# Patient Record
Sex: Female | Born: 1940 | Hispanic: No | Marital: Married | Smoking: Never smoker
Health system: Southern US, Community
[De-identification: ages and names within clinical notes are randomized; demographics above are authoritative.]

## PROBLEM LIST (undated history)

## (undated) DIAGNOSIS — H16009 Unspecified corneal ulcer, unspecified eye: Secondary | ICD-10-CM

## (undated) DIAGNOSIS — R9431 Abnormal electrocardiogram [ECG] [EKG]: Secondary | ICD-10-CM

## (undated) DIAGNOSIS — E669 Obesity, unspecified: Secondary | ICD-10-CM

## (undated) DIAGNOSIS — E7800 Pure hypercholesterolemia, unspecified: Secondary | ICD-10-CM

## (undated) DIAGNOSIS — R5383 Other fatigue: Secondary | ICD-10-CM

## (undated) DIAGNOSIS — K219 Gastro-esophageal reflux disease without esophagitis: Secondary | ICD-10-CM

## (undated) DIAGNOSIS — M8580 Other specified disorders of bone density and structure, unspecified site: Secondary | ICD-10-CM

## (undated) DIAGNOSIS — Z8719 Personal history of other diseases of the digestive system: Secondary | ICD-10-CM

## (undated) DIAGNOSIS — I10 Essential (primary) hypertension: Secondary | ICD-10-CM

## (undated) DIAGNOSIS — K5792 Diverticulitis of intestine, part unspecified, without perforation or abscess without bleeding: Secondary | ICD-10-CM

## (undated) DIAGNOSIS — E049 Nontoxic goiter, unspecified: Secondary | ICD-10-CM

## (undated) HISTORY — PX: BREAST LUMPECTOMY: SHX2

## (undated) HISTORY — DX: Other specified disorders of bone density and structure, unspecified site: M85.80

## (undated) HISTORY — PX: ABDOMINAL HYSTERECTOMY: SHX81

## (undated) HISTORY — PX: TONSILLECTOMY: SHX5217

## (undated) HISTORY — DX: Other fatigue: R53.83

## (undated) HISTORY — DX: Obesity, unspecified: E66.9

---

## 2001-09-03 ENCOUNTER — Ambulatory Visit (HOSPITAL_COMMUNITY): Admission: RE | Admit: 2001-09-03 | Discharge: 2001-09-03 | Payer: Self-pay | Admitting: Gastroenterology

## 2002-07-28 ENCOUNTER — Encounter: Payer: Self-pay | Admitting: Family Medicine

## 2002-07-28 ENCOUNTER — Encounter: Admission: RE | Admit: 2002-07-28 | Discharge: 2002-07-28 | Payer: Self-pay | Admitting: Family Medicine

## 2004-07-18 ENCOUNTER — Other Ambulatory Visit: Admission: RE | Admit: 2004-07-18 | Discharge: 2004-07-18 | Payer: Self-pay | Admitting: Obstetrics and Gynecology

## 2006-03-23 HISTORY — PX: US ECHOCARDIOGRAPHY: HXRAD669

## 2009-06-08 HISTORY — PX: CARDIOVASCULAR STRESS TEST: SHX262

## 2010-10-23 DIAGNOSIS — H16009 Unspecified corneal ulcer, unspecified eye: Secondary | ICD-10-CM

## 2010-10-23 HISTORY — DX: Unspecified corneal ulcer, unspecified eye: H16.009

## 2011-09-23 ENCOUNTER — Emergency Department (HOSPITAL_COMMUNITY)
Admission: EM | Admit: 2011-09-23 | Discharge: 2011-09-23 | Disposition: A | Discharge status: Home / Self Care | Payer: Medicare Other | Source: Home / Self Care | Attending: Emergency Medicine | Admitting: Emergency Medicine

## 2011-09-23 ENCOUNTER — Encounter: Payer: Self-pay | Admitting: *Deleted

## 2011-09-23 DIAGNOSIS — K5792 Diverticulitis of intestine, part unspecified, without perforation or abscess without bleeding: Secondary | ICD-10-CM

## 2011-09-23 DIAGNOSIS — K5732 Diverticulitis of large intestine without perforation or abscess without bleeding: Secondary | ICD-10-CM

## 2011-09-23 HISTORY — DX: Diverticulitis of intestine, part unspecified, without perforation or abscess without bleeding: K57.92

## 2011-09-23 HISTORY — DX: Pure hypercholesterolemia, unspecified: E78.00

## 2011-09-23 HISTORY — DX: Essential (primary) hypertension: I10

## 2011-09-23 NOTE — ED Provider Notes (Signed)
History     CSN: [id number] Arrival date & time: 09/23/2011  8:01 PM   First MD Initiated Contact with Patient 09/23/11 1821      Chief Complaint  Patient presents with  . Abdominal Pain    left lower abdominal pain completed antibiotic cipro  wednesday for diverticulitis -  had prescription for clindamycin 150mg  label reads 3 capsules  TID for 10 days pt was taking only one capsule TID stopped taking wednesday thought was given too many pills     (Consider location/radiation/quality/duration/timing/severity/associated sxs/prior treatment) HPI Comments: Whitney Morales presents for evaluation of persistent LLQ abdominal pain. She states that she was dx'd with diverticulitis on 11/19; given cipro and clinda 150 mg 3 capsules TID, thus equaling 9 pills daily. She misunderstood and only took one clinda capsule three times daily and completed cipro; now with residual LLQ pain, described as 4/10 and less than previous episodes of diverticulitis; advised to complete course and follow up with [facility].  Patient is a 70 y.o. female presenting with abdominal pain. The history is provided by the patient and the spouse.  Abdominal Pain The primary symptoms of the illness include abdominal pain. The primary symptoms of the illness do not include fever, nausea, vomiting or diarrhea. The current episode started more than 2 days ago. The problem has been gradually improving.  The patient states that she believes she is currently not pregnant. Symptoms associated with the illness do not include chills or constipation. Significant associated medical issues include diverticulitis.    Past Medical History  Diagnosis Date  . Diverticulitis   . Diabetes mellitus   . Hypertension   . High cholesterol     Past Surgical History  Procedure Date  . Abdominal hysterectomy   . Breast lumpectomy   . Tonsillectomy     History reviewed. No pertinent family history.  History  Substance Use Topics  . Smoking  status: Never Smoker   . Smokeless tobacco: Not on file  . Alcohol Use: No    OB History    Grav Para Term Preterm Abortions TAB SAB Ect Mult Living                  Review of Systems  Constitutional: Negative for fever, chills and appetite change.  HENT: Negative.   Eyes: Negative.   Respiratory: Negative.   Cardiovascular: Negative.   Gastrointestinal: Positive for abdominal pain. Negative for nausea, vomiting, diarrhea and constipation.  Genitourinary: Negative.   Musculoskeletal: Negative.   Skin: Negative.   Neurological: Negative.     Allergies  Metronidazole hcl  Home Medications   Current Outpatient Rx  Name Route Sig Dispense Refill  . ASPIRIN 81 MG PO TABS Oral Take 81 mg by mouth daily.      Marland Kitchen FLUTICASONE PROPIONATE 50 MCG/ACT NA SUSP Nasal Place 2 sprays into the nose 1 day or 1 dose.      Marland Kitchen GLIPIZIDE 5 MG PO TABS Oral Take 5 mg by mouth 2 (two) times daily before a meal.      . LISINOPRIL-HYDROCHLOROTHIAZIDE 20-25 MG PO TABS Oral Take 1 tablet by mouth daily.      Marland Kitchen METFORMIN HCL 500 MG PO TABS Oral Take 500 mg by mouth 2 (two) times daily with a meal.      . SIMVASTATIN 40 MG PO TABS Oral Take 40 mg by mouth at bedtime.      Marland Kitchen ESZOPICLONE 1 MG PO TABS Oral Take 1 mg by mouth at  bedtime. Take immediately before bedtime       BP 179/75  Pulse 86  Temp(Src) 98.9 F (37.2 C) (Oral)  Resp 18  SpO2 100%  Physical Exam  Nursing note and vitals reviewed. Constitutional: She is oriented to person, place, and time. She appears well-developed and well-nourished.  HENT:  Head: Normocephalic and atraumatic.  Eyes: EOM are normal.  Neck: Normal range of motion.  Pulmonary/Chest: Effort normal.  Abdominal: Soft. Normal appearance and bowel sounds are normal. There is tenderness in the left lower quadrant. There is guarding. There is no rebound.  Musculoskeletal: Normal range of motion.  Neurological: She is alert and oriented to person, place, and time.  Skin:  Skin is warm and dry.  Psychiatric: Her behavior is normal.    ED Course  Procedures (including critical care time)  Labs Reviewed - No data to display No results found.   No diagnosis found.    MDM          Richardo Priest, MD 09/26/11 [phone]

## 2012-01-17 ENCOUNTER — Encounter: Payer: Self-pay | Admitting: *Deleted

## 2012-09-10 ENCOUNTER — Other Ambulatory Visit: Payer: Self-pay | Admitting: Family Medicine

## 2012-09-10 DIAGNOSIS — E041 Nontoxic single thyroid nodule: Secondary | ICD-10-CM

## 2012-09-11 ENCOUNTER — Ambulatory Visit
Admission: RE | Admit: 2012-09-11 | Discharge: 2012-09-11 | Disposition: A | Discharge status: Home / Self Care | Payer: Medicare Other | Source: Ambulatory Visit | Attending: Family Medicine | Admitting: Family Medicine

## 2012-09-11 DIAGNOSIS — E041 Nontoxic single thyroid nodule: Secondary | ICD-10-CM

## 2012-09-13 ENCOUNTER — Encounter (INDEPENDENT_AMBULATORY_CARE_PROVIDER_SITE_OTHER): Payer: Self-pay | Admitting: General Surgery

## 2012-09-16 ENCOUNTER — Encounter (INDEPENDENT_AMBULATORY_CARE_PROVIDER_SITE_OTHER): Payer: Self-pay

## 2012-09-17 ENCOUNTER — Other Ambulatory Visit (INDEPENDENT_AMBULATORY_CARE_PROVIDER_SITE_OTHER): Payer: Self-pay | Admitting: General Surgery

## 2012-09-17 ENCOUNTER — Encounter (INDEPENDENT_AMBULATORY_CARE_PROVIDER_SITE_OTHER): Payer: Self-pay | Admitting: General Surgery

## 2012-09-17 ENCOUNTER — Ambulatory Visit (INDEPENDENT_AMBULATORY_CARE_PROVIDER_SITE_OTHER): Payer: Medicare Other | Admitting: General Surgery

## 2012-09-17 VITALS — BP 170/90 | HR 80 | Temp 98.4°F | Resp 18 | Ht 63.0 in | Wt 230.0 lb

## 2012-09-17 DIAGNOSIS — E039 Hypothyroidism, unspecified: Secondary | ICD-10-CM

## 2012-09-17 DIAGNOSIS — E079 Disorder of thyroid, unspecified: Secondary | ICD-10-CM

## 2012-09-17 DIAGNOSIS — E049 Nontoxic goiter, unspecified: Secondary | ICD-10-CM

## 2012-09-17 NOTE — Progress Notes (Signed)
Patient ID: Whitney Morales, female   DOB: December 17, 1940, 71 y.o.   MRN: [id number]  Chief Complaint  Patient presents with  . New Evaluation    eval thyroid    HPI Whitney Morales is a 72 y.o. female.  The patient is a 71 year old female who is referred by Dr. Tanya Nones for a right thyroid mass.  The patient is she's had no symptoms of hypothyroidism and has noticed a mass in the right area of her neck. Patient was worked up and was found to have an elevated TSH of 5.104. Patient also ultrasound which revealed 2.8 cm right thyroid complex cystic lesion. There was no biopsy done on this at this point. Patient was patient otherwise notes no subjective changes in temperature no changes, or bone pain. HPI  Past Medical History  Diagnosis Date  . Diverticulitis   . Diabetes mellitus   . Hypertension   . High cholesterol   . Fatigue   . Obesity     Past Surgical History  Procedure Date  . Abdominal hysterectomy   . Breast lumpectomy   . Tonsillectomy   . US echocardiography 03/23/2006    EF 55-60%  . Cardiovascular stress test 06/08/2009    EF 78%, NO ISCHEMIA    No family history on file.  Social History History  Substance Use Topics  . Smoking status: Never Smoker   . Smokeless tobacco: Not on file  . Alcohol Use: No    Allergies  Allergen Reactions  . Metronidazole Hcl     Current Outpatient Prescriptions  Medication Sig Dispense Refill  . aspirin 81 MG tablet Take 81 mg by mouth daily.        . eszopiclone (LUNESTA) 1 MG TABS Take 1 mg by mouth at bedtime. Take immediately before bedtime       . fluticasone (FLONASE) 50 MCG/ACT nasal spray Place 2 sprays into the nose 1 day or 1 dose.        Marland Kitchen glipiZIDE (GLUCOTROL) 5 MG tablet Take 5 mg by mouth 2 (two) times daily before a meal.        . lisinopril-hydrochlorothiazide (PRINZIDE,ZESTORETIC) 20-25 MG per tablet Take 1 tablet by mouth daily.        . metFORMIN (GLUCOPHAGE) 500 MG tablet Take 500 mg by mouth 2 (two) times  daily with a meal.        . simvastatin (ZOCOR) 40 MG tablet Take 40 mg by mouth at bedtime.          Review of Systems Review of Systems  Constitutional: Negative.   HENT: Negative.   Eyes: Negative.   Respiratory: Negative.   Cardiovascular: Negative.   Gastrointestinal: Negative.   Musculoskeletal: Negative.   Neurological: Negative.     Blood pressure 170/90, pulse 80, temperature 98.4 F (36.9 C), resp. rate 18, height 5\' 3"  (1.6 m), weight 230 lb (104.327 kg).  Physical Exam Physical Exam  Constitutional: She is oriented to person, place, and time. She appears well-developed and well-nourished.  HENT:  Head: Normocephalic and atraumatic.  Eyes: Conjunctivae normal and EOM are normal. Pupils are equal, round, and reactive to light.  Neck: Normal range of motion. Neck supple.    Cardiovascular: Normal rate, regular rhythm and normal heart sounds.   Pulmonary/Chest: Effort normal and breath sounds normal.  Abdominal: Soft. Bowel sounds are normal.  Musculoskeletal: Normal range of motion.  Neurological: She is alert and oriented to person, place, and time.    Data Reviewed Ultrasound  revealing a 3.8 cm right thyroid complex cystic lesion ; TSH of 5.104 (H)  Assessment    The patient is a 71 year old female with a right Thyroid mass.  Her ultrasound and lab for studies have been reviewed. Secondary to patient's large right thyroid mass which is likely secondary to goiter, I would still recommend a FNA biopsy.  A long discussion with the patient and her husband and described a stepwise fashion with biopsy, biopsy report, and then set up for surgery of the likely right hemithyroidectomy should the pathology results revealed benign or questionable results. Let's proceed for a total thyroidectomy should the biopsy revealed cancer. She has no cancer within her family, or thyroid cancer.    Plan    1. Will proceed for ultrasound-guided FNA. Pending those results we will set  the patient up for her right upper lobectomy versus total thyroidectomy. The patient and her husband were given a handout regarding the thyroid and thyroid goiters.       Marigene Ehlers., Ceonna Frazzini 09/17/2012, 2:42 PM

## 2012-09-25 ENCOUNTER — Other Ambulatory Visit (HOSPITAL_COMMUNITY)
Admission: RE | Admit: 2012-09-25 | Discharge: 2012-09-25 | Disposition: A | Discharge status: Home / Self Care | Payer: Medicare Other | Source: Ambulatory Visit | Attending: Interventional Radiology | Admitting: Interventional Radiology

## 2012-09-25 ENCOUNTER — Ambulatory Visit
Admission: RE | Admit: 2012-09-25 | Discharge: 2012-09-25 | Disposition: A | Discharge status: Home / Self Care | Payer: Medicare Other | Source: Ambulatory Visit | Attending: General Surgery | Admitting: General Surgery

## 2012-09-25 DIAGNOSIS — E049 Nontoxic goiter, unspecified: Secondary | ICD-10-CM | POA: Insufficient documentation

## 2012-09-25 DIAGNOSIS — E079 Disorder of thyroid, unspecified: Secondary | ICD-10-CM

## 2012-10-01 ENCOUNTER — Telehealth (INDEPENDENT_AMBULATORY_CARE_PROVIDER_SITE_OTHER): Payer: Self-pay

## 2012-10-01 ENCOUNTER — Other Ambulatory Visit (INDEPENDENT_AMBULATORY_CARE_PROVIDER_SITE_OTHER): Payer: Self-pay | Admitting: General Surgery

## 2012-10-01 NOTE — Telephone Encounter (Signed)
Patient calling in for FNA results and treatment plan.  Patient said that it's okay to leave a detailed message on [phone].

## 2012-10-21 ENCOUNTER — Encounter (HOSPITAL_COMMUNITY): Payer: Self-pay | Admitting: Pharmacy Technician

## 2012-10-29 ENCOUNTER — Encounter (HOSPITAL_COMMUNITY)
Admission: RE | Admit: 2012-10-29 | Discharge: 2012-10-29 | Disposition: A | Discharge status: Home / Self Care | Payer: Medicare Other | Source: Ambulatory Visit | Attending: Anesthesiology | Admitting: Anesthesiology

## 2012-10-29 ENCOUNTER — Encounter (HOSPITAL_COMMUNITY)
Admission: RE | Admit: 2012-10-29 | Discharge: 2012-10-29 | Disposition: A | Discharge status: Home / Self Care | Payer: Medicare Other | Source: Ambulatory Visit | Attending: General Surgery | Admitting: General Surgery

## 2012-10-29 ENCOUNTER — Encounter (HOSPITAL_COMMUNITY): Payer: Self-pay

## 2012-10-29 DIAGNOSIS — R9431 Abnormal electrocardiogram [ECG] [EKG]: Secondary | ICD-10-CM

## 2012-10-29 HISTORY — DX: Personal history of other diseases of the digestive system: Z87.19

## 2012-10-29 HISTORY — DX: Nontoxic goiter, unspecified: E04.9

## 2012-10-29 HISTORY — DX: Abnormal electrocardiogram (ECG) (EKG): R94.31

## 2012-10-29 LAB — CBC
HCT: 40.9 % (ref 36.0–46.0)
Hemoglobin: 14 g/dL (ref 12.0–15.0)
MCH: 30.4 pg (ref 26.0–34.0)
MCHC: 34.2 g/dL (ref 30.0–36.0)
MCV: 88.9 fL (ref 78.0–100.0)
Platelets: 370 10*3/uL (ref 150–400)
RBC: 4.6 MIL/uL (ref 3.87–5.11)
RDW: 12.9 % (ref 11.5–15.5)
WBC: 12.1 10*3/uL — ABNORMAL HIGH (ref 4.0–10.5)

## 2012-10-29 LAB — BASIC METABOLIC PANEL
BUN: 20 mg/dL (ref 6–23)
CO2: 25 mEq/L (ref 19–32)
Calcium: 9.9 mg/dL (ref 8.4–10.5)
Chloride: 94 mEq/L — ABNORMAL LOW (ref 96–112)
Creatinine, Ser: 1.29 mg/dL — ABNORMAL HIGH (ref 0.50–1.10)
GFR calc Af Amer: 48 mL/min — ABNORMAL LOW (ref >90)
GFR calc non Af Amer: 41 mL/min — ABNORMAL LOW (ref >90)
Glucose, Bld: 173 mg/dL — ABNORMAL HIGH (ref 70–99)
Potassium: 4.5 mEq/L (ref 3.5–5.1)
Sodium: 135 mEq/L (ref 135–145)

## 2012-10-29 LAB — SURGICAL PCR SCREEN
MRSA, PCR: NEGATIVE
Staphylococcus aureus: POSITIVE — AB

## 2012-10-29 NOTE — Pre-Procedure Instructions (Signed)
20 Whitney Morales  10/29/2012   Your procedure is scheduled on:  January 13  Report to [facility] at 09:50 AM.  Call this number if you have problems the morning of surgery: [phone]   Remember:   Do not eat or drink:After Midnight.  Take these medicines the morning of surgery with A SIP OF WATER: Flonase   STOP Aspirin after today  Do not wear jewelry, make-up or nail polish.  Do not wear lotions, powders, or perfumes. You may wear deodorant.  Do not shave 48 hours prior to surgery. Men may shave face and neck.  Do not bring valuables to the hospital.  Contacts, dentures or bridgework may not be worn into surgery.  Leave suitcase in the car. After surgery it may be brought to your room.  For patients admitted to the hospital, checkout time is 11:00 AM the day of discharge.   Patients discharged the day of surgery will not be allowed to drive home.  Name and phone number of your driver: Family/ Friend  Special Instructions: Shower using CHG 2 nights before surgery and the night before surgery.  If you shower the day of surgery use CHG.  Use special wash - you have one bottle of CHG for all showers.  You should use approximately 1/3 of the bottle for each shower.   Please read over the following fact sheets that you were given: Pain Booklet, Coughing and Deep Breathing and Surgical Site Infection Prevention

## 2012-10-29 NOTE — Progress Notes (Signed)
Requested records from Dr. Delbert Harness stress test and most recent OV from ~ 2 years ago. They reported that this this had been transferred to pt's new PCP contacted Dr. Felisa Bonier office and they report that they do not have test on file. Will re contact Dr. Delbert Harness office and once reopened and determine if they still have copies of reports.

## 2012-10-30 ENCOUNTER — Encounter (HOSPITAL_COMMUNITY): Payer: Self-pay | Admitting: Vascular Surgery

## 2012-10-30 NOTE — Consult Note (Addendum)
Anesthesia Chart Review:  Patient is a 72 year old female scheduled for right thyroid lobectomy by Dr. Derrell Lolling on 11/04/12.  History includes obesity, non-smoker, HTN, DM type 2, hypercholesterolemia, diverticulitis, goiter, hiatal hernia, parents both with history of CAD.  PCP is Dr. Lynnea Ferrier.     EKG on 10/29/12 showed NSR, LAD, inferior infarct and anterior infarct (age undetermined).  Currently, there are no comparison EKG available.  Her PAT RN had written that she had a previous stress test at Dr. Otilio Saber office, but their office reports she was never a patient there.  Patient confirms this.  She did say that she saw a Cardiologist at [facility])  "years" ago for a baseline stress test and what sounds like abnormal EKG (she said they were concerned that she may have had a heart attack, but tests proved to be negative for MI).  Unfortunately, Dr. Caren Macadam office does not have record of a prior stress test or an old EKG.  [facility] also has no records.  Patient does report "indigestion" after a large meal relieved with soda, but otherwise denies chest pain.  She said that she vacationed in [place] in September 2013 and was able to do several walking towards including walking up the stairs in [place] (reportedly equivalent to "13 stories").   She denies SOB at rest and significant DOE.  She gets mild LE edema that is controlled by Lasix.     CXR on 10/29/12 showed: Lung volumes are low with crowding of the bronchovascular markings. Lung volumes are low. No focal pulmonary opacity with the exception of minimal bibasilar dependent atelectasis. Heart size is normal. No pleural effusion. No acute osseous finding.  Preoperative labs noted.  I reviewed above with anesthesiologist Dr. Chaney Malling who feels that unless we are able to obtain her prior stress test and EKG and are able to confirm that her EKG is stable then she would need to be seen  by cardiology preoperatively due to abnormal EKG and multiple risk factors for CAD.  I have notified Pattricia Boss at Dr. Derrell Lolling' office.  Shonna Chock, PA-C 10/31/12 1425

## 2012-10-31 ENCOUNTER — Other Ambulatory Visit (INDEPENDENT_AMBULATORY_CARE_PROVIDER_SITE_OTHER): Payer: Self-pay | Admitting: General Surgery

## 2012-10-31 ENCOUNTER — Telehealth (INDEPENDENT_AMBULATORY_CARE_PROVIDER_SITE_OTHER): Payer: Self-pay | Admitting: General Surgery

## 2012-10-31 ENCOUNTER — Encounter (INDEPENDENT_AMBULATORY_CARE_PROVIDER_SITE_OTHER): Payer: Self-pay | Admitting: General Surgery

## 2012-10-31 DIAGNOSIS — R9431 Abnormal electrocardiogram [ECG] [EKG]: Secondary | ICD-10-CM

## 2012-10-31 NOTE — Progress Notes (Signed)
Spoke with Dr Janeece Fitting office and they state they have never had Ms. Maland as a patient. Spoke again with Dr Caren Macadam office and they do not have an EKG or stress test for this patient.

## 2012-10-31 NOTE — Telephone Encounter (Signed)
Allison the P.A.from the hospital called and stated that the patient will need to have cardiac clearance before her surgery on Monday 11-04-2012 due to having a abnormal EKG from the hospital today 10-31-2012. Patient has a apt on 11-01-12 @ 11:00 and she is seeing Dr Melburn Popper and patient is aware that she will need to be cleared before her surgery on Monday 11-04-2012 if not it will be cancelled. I called Revonda Standard back to let her know that the patient has a apt tomorrow and I will fax over notes once I get them from Dr Elease Hashimoto to her attn. Revonda Standard.

## 2012-11-01 ENCOUNTER — Encounter: Payer: Self-pay | Admitting: Cardiovascular Disease

## 2012-11-01 ENCOUNTER — Telehealth (INDEPENDENT_AMBULATORY_CARE_PROVIDER_SITE_OTHER): Payer: Self-pay | Admitting: General Surgery

## 2012-11-01 ENCOUNTER — Ambulatory Visit (INDEPENDENT_AMBULATORY_CARE_PROVIDER_SITE_OTHER): Payer: Medicare Other | Admitting: Cardiovascular Disease

## 2012-11-01 VITALS — BP 144/78 | HR 89 | Ht 63.0 in | Wt 222.0 lb

## 2012-11-01 DIAGNOSIS — E119 Type 2 diabetes mellitus without complications: Secondary | ICD-10-CM

## 2012-11-01 DIAGNOSIS — R9431 Abnormal electrocardiogram [ECG] [EKG]: Secondary | ICD-10-CM

## 2012-11-01 NOTE — Progress Notes (Addendum)
Whitney Morales Date of Birth  1940/11/04       [facility]    [place] [address]  [address] [place], [place]  [address]   [place], [place]  [address] [phone]     [phone]   Fax  [phone]    Fax [phone]  Problem List: 1. Chest pains - previous negative stress test with Dr. Reyes Ivan 2. Diverticulitis 3. Thyroid nodule  History of Present Illness:  Whitney Morales is a 72 year old female who presents today for preoperative evaluation prior to surgery for a thyroid nodule. She has previously seen Dr. Reyes Ivan. She's had several stress tests in the past all of which were negative.  She used to have chest pains in the past but has not had any recently  She's not had any further episodes of chest pain.  She recently returned from a trip to [place]. She was able to walk to the top without any significant chest pains, shortness breath, syncope, or presyncope.    Current Outpatient Prescriptions on File Prior to Visit  Medication Sig Dispense Refill  . eszopiclone (LUNESTA) 1 MG TABS Take 1 mg by mouth as needed. Take immediately before bedtime as needed, patient takes mainly when traveling      . fluticasone (FLONASE) 50 MCG/ACT nasal spray Place 2 sprays into the nose as needed.       Marland Kitchen glipiZIDE (GLUCOTROL) 5 MG tablet Take 5 mg by mouth 2 (two) times daily before a meal.        . lisinopril-hydrochlorothiazide (PRINZIDE,ZESTORETIC) 20-25 MG per tablet Take 1 tablet by mouth daily.        . metFORMIN (GLUCOPHAGE) 500 MG tablet Take 1,000 mg by mouth 2 (two) times daily with a meal.       . simvastatin (ZOCOR) 40 MG tablet Take 40 mg by mouth at bedtime.        Marland Kitchen aspirin 81 MG tablet Take 81 mg by mouth daily.          Allergies  Allergen Reactions  . Levaquin (Levofloxacin) Nausea And Vomiting  . Metronidazole Hcl Other (See Comments)    Deathly sick.    Past Medical History  Diagnosis Date  . Diverticulitis     . Diabetes mellitus   . Hypertension   . High cholesterol   . Fatigue   . Obesity   . H/O hiatal hernia   . Goiter     cyst vs goiter on thyroid    Past Surgical History  Procedure Date  . Abdominal hysterectomy   . Breast lumpectomy   . Tonsillectomy   . US echocardiography 03/23/2006    EF 55-60%  . Cardiovascular stress test 06/08/2009    EF 78%, NO ISCHEMIA    History  Smoking status  . Never Smoker   Smokeless tobacco  . Not on file    History  Alcohol Use No    No family history on file.  Reviw of Systems:  Reviewed in the HPI.  All other systems are negative.  Physical Exam: Blood pressure 144/78, pulse 89, height 5\' 3"  (1.6 m), weight 222 lb (100.699 kg), SpO2 98.00%. General: Well developed, well nourished, in no acute distress.  Head: Normocephalic, atraumatic, sclera non-icteric, mucus membranes are moist,   Neck: Supple. Carotids are 2 + without bruits. No JVD , her neck is thick  Lungs: Clear   Heart: RR, normal S1, S2  Abdomen: Soft, non-tender, non-distended  with normal bowel sounds.  Msk:  Strength and tone are normal   Extremities: No clubbing or cyanosis. No edema.  Distal pedal pulses are 2+ and equal    Neuro: CN II - XII intact.  Alert and oriented X 3.   Psych:  Normal   ECG: November 01, 2012: Normal sinus rhythm at 89 beats a minute. 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 Chest poor R-wave progression that is probably due to lead placement.  I cannot rule out an old anterior wall myocardial infarction.  Assessment / Plan:

## 2012-11-01 NOTE — Progress Notes (Signed)
CALLED SURGICAL SCHEDULING TO INFORM NEED TO POSTPONE SURGERY, PT NEEDS 2 DAY STRESS TEST, TOLD TO POSTPONE FOR ONE WEEK.

## 2012-11-01 NOTE — Telephone Encounter (Signed)
Called patient to let her that I cancelled her apt for surgery on 11-04-12 until I get the cardiac clearance from Dr Elease Hashimoto for surgery

## 2012-11-01 NOTE — Assessment & Plan Note (Signed)
Whitney Morales presents today with an abnormal EKG. She's had workups in the past which have been negative. She saw Dr. Reyes Ivan possibly 5 years ago. At that time she had an abnormal EKG which sounds like poor R-wave progression. She was told that the abnormality was probably due to lead placement. Today she presents with inferior Q waves which suggest the presence of an previous inferior wall myocardial infarction. I do not think that this is due to lead placement.  From a clinical standpoint she seems to be doing well. She and her husband went on a trip to [place] and [place] for 2 weeks. She walked quite a bit and never had any episodes of chest pain or shortness of breath.   I think that the likelihood of her having significant coronary artery disease is fairly low however I cannot explain the inferior Q waves which appear to be due to a previous inferior wall myocardial infarction.   I think it we probably need to repeat a Lexiscan  Myoview study ( 2 day protocol)  She will need to schedule her thyroid surgery for next week.

## 2012-11-01 NOTE — Patient Instructions (Addendum)
Your physician has requested that you have a lexiscan myoview AS SOON AS CAN PT HAS SURGERY SCHEDULED.Marland Kitchen  Please follow instruction sheet, as given.  Your physician recommends that you schedule a follow-up appointment in: AS NEEDED PER RESULTS

## 2012-11-01 NOTE — Telephone Encounter (Signed)
Dr. Elease Hashimoto was referred this pt for cardiac clearance following "abnorma ECG."  He needs to obtain a 2-day stress test; pt will not be cleared in time for the already scheduled surgery on 11/04/12.  They are calling to alert Dr. Derrell Lolling of this and suggested postponement by 1 week.

## 2012-11-04 ENCOUNTER — Ambulatory Visit (HOSPITAL_COMMUNITY): Admission: RE | Admit: 2012-11-04 | Payer: Medicare Other | Source: Ambulatory Visit | Admitting: General Surgery

## 2012-11-04 SURGERY — LOBECTOMY, THYROID
Anesthesia: General | Laterality: Right

## 2012-11-05 ENCOUNTER — Telehealth: Payer: Self-pay | Admitting: Cardiovascular Disease

## 2012-11-05 ENCOUNTER — Ambulatory Visit (HOSPITAL_COMMUNITY): Payer: Medicare Other | Attending: Cardiology | Admitting: Radiology

## 2012-11-05 VITALS — BP 132/64 | Ht 63.0 in | Wt 220.0 lb

## 2012-11-05 DIAGNOSIS — R42 Dizziness and giddiness: Secondary | ICD-10-CM | POA: Insufficient documentation

## 2012-11-05 DIAGNOSIS — Z01810 Encounter for preprocedural cardiovascular examination: Secondary | ICD-10-CM

## 2012-11-05 DIAGNOSIS — E119 Type 2 diabetes mellitus without complications: Secondary | ICD-10-CM

## 2012-11-05 DIAGNOSIS — R9431 Abnormal electrocardiogram [ECG] [EKG]: Secondary | ICD-10-CM

## 2012-11-05 DIAGNOSIS — I10 Essential (primary) hypertension: Secondary | ICD-10-CM | POA: Insufficient documentation

## 2012-11-05 DIAGNOSIS — I4949 Other premature depolarization: Secondary | ICD-10-CM

## 2012-11-05 MED ORDER — REGADENOSON 0.4 MG/5ML IV SOLN
0.4000 mg | Freq: Once | INTRAVENOUS | Status: AC
  Administered 2012-11-05: 0.4 mg via INTRAVENOUS

## 2012-11-05 MED ORDER — TECHNETIUM TC 99M SESTAMIBI GENERIC - CARDIOLITE
10.0000 | Freq: Once | INTRAVENOUS | Status: AC | PRN
  Administered 2012-11-05: 10 via INTRAVENOUS

## 2012-11-05 MED ORDER — TECHNETIUM TC 99M SESTAMIBI GENERIC - CARDIOLITE
30.0000 | Freq: Once | INTRAVENOUS | Status: AC | PRN
  Administered 2012-11-05: 30 via INTRAVENOUS

## 2012-11-05 NOTE — Telephone Encounter (Signed)
Walk in pt Form " pt left Old EKG's" Sent to [facility] 11/05/12/KM

## 2012-11-05 NOTE — Progress Notes (Signed)
[facility] SITE 3 NUCLEAR MED [address] [place], [place] [address] [phone]    Cardiology Nuclear Med Study  Whitney Morales is a 72 y.o. female     MRN : [id number]     DOB: 09-05-1941  Procedure Date: 11/05/2012  Nuclear Med Background Indication for Stress Test:  Evaluation for Ischemia, and Pending Surgical Clearance for Thyroid Lobectomy by Dr. Axel Filler History:  '07 Echo: 55-60%;'10 MPS:no ischemia,EF=60% Cardiac Risk Factors: Hypertension and Lipids  Symptoms:  Dizziness: with hypoglycemic episode  and Fatigue   Nuclear Pre-Procedure Caffeine/Decaff Intake:  None > 12 hrs NPO After: 7:30am   Lungs:  clear O2 Sat: 98% on room air. IV 0.9% NS with Angio Cath:  20g  IV Site: R Forearm x 1, tolerated well IV Started by:  Irean Hong, RN  Chest Size (in):  44 Cup Size: DD  Height: 5\' 3"  (1.6 m)  Weight:  220 lb (99.791 kg)  BMI:  Body mass index is 38.97 kg/(m^2). Tech Comments:  No medication today per patient    Nuclear Med Study 1 or 2 day study: 1 day  Stress Test Type:  Treadmill/Lexiscan  Reading MD: Olga Millers, MD  Order Authorizing Provider:  Kristeen Miss, MD  Resting Radionuclide: Technetium 51m Sestamibi  Resting Radionuclide Dose: 11.0 mCi   Stress Radionuclide:  Technetium 29m Sestamibi  Stress Radionuclide Dose: 33.0 mCi           Stress Protocol Rest HR: 79 Stress HR: 123  Rest BP: 132/64 Stress BP: 173/53  Exercise Time (min): 2:00 METS: 1.6   Predicted Max HR: 151 bpm % Max HR: 82.78 bpm Rate Pressure Product: 21308    Dose of Adenosine (mg):  n/a Dose of Lexiscan: 0.4 mg  Dose of Atropine (mg): n/a Dose of Dobutamine: n/a mcg/kg/min (at max HR)  Stress Test Technologist: Cathlyn Parsons, RN  Nuclear Technologist:  Domenic Polite, CNMT     Rest Procedure:  Myocardial perfusion imaging was performed at rest 45 minutes following the intravenous administration of Technetium 71m Sestamibi. Rest ECG: NSR, prior  anterior MI.  Stress Procedure:  The patient received IV Lexiscan 0.4 mg over 15-seconds with concurrent low level exercise and then Technetium 32m Sestamibi was injected at 30-seconds while the patient continued walking one more minute.  Quantitative spect images were obtained after a 45-minute delay. Stress ECG: No significant ST segment change suggestive of ischemia.  QPS Raw Data Images:  Acquisition technically good; normal left ventricular size. Stress Images:  There is decreased uptake in the apex. Rest Images:  There is decreased uptake in the apex. Subtraction (SDS):  No evidence of ischemia. Transient Ischemic Dilatation (Normal <1.22):  1.11 Lung/Heart Ratio (Normal <0.45):  0.30  Quantitative Gated Spect Images QGS EDV:  60 ml QGS ESV:  17 ml  Impression Exercise Capacity:  Lexiscan with no exercise. BP Response:  Normal blood pressure response. Clinical Symptoms:  No chest pain or dyspnea. ECG Impression:  No significant ST segment change suggestive of ischemia. Comparison with Prior Nuclear Study: No images to compare  Overall Impression:  Normal stress nuclear study with a small, mild, fixed apical defect consistent with thinning; no ischemia.  LV Ejection Fraction: 72%.  LV Wall Motion:  NL LV Function; NL Wall Motion  Olga Millers

## 2012-11-08 ENCOUNTER — Telehealth (INDEPENDENT_AMBULATORY_CARE_PROVIDER_SITE_OTHER): Payer: Self-pay | Admitting: General Surgery

## 2012-11-08 ENCOUNTER — Encounter: Payer: Self-pay | Admitting: *Deleted

## 2012-11-08 NOTE — Telephone Encounter (Signed)
Called to confirm confirmation of Cardiac clearance and told the patient that Dr.Ramirez would be back in the office on 11/12/12 and would have the orders filled out then and give them to the surgery schedulers who should then in return call her within the week to set up surgery...told her to call me back 1/27 if she had not heard from them by then

## 2012-11-12 ENCOUNTER — Other Ambulatory Visit (INDEPENDENT_AMBULATORY_CARE_PROVIDER_SITE_OTHER): Payer: Self-pay | Admitting: General Surgery

## 2012-11-12 ENCOUNTER — Telehealth (INDEPENDENT_AMBULATORY_CARE_PROVIDER_SITE_OTHER): Payer: Self-pay | Admitting: General Surgery

## 2012-11-12 NOTE — Telephone Encounter (Signed)
Called to let patient know that order were filled out and that i was taking them to the OR schedulers now

## 2012-11-14 ENCOUNTER — Encounter (HOSPITAL_COMMUNITY): Payer: Self-pay | Admitting: *Deleted

## 2012-11-14 NOTE — Pre-Procedure Instructions (Signed)
[facility] SURGERY INSTRUCTIONS DISCUSSED WITH PT BY PHONE--SHE WAS PREVIOUSLY SCHEDULED FOR THIS SURGERY AT [facility]--STATES SHE HAS HER CHLORHEXIDINE TO SHOWER WITH AND PT KNOWS TO SHOWER THE 2 NIGHTS BEFORE SURGERY--REMINDED NOT TO USE FACE, HEAD OR PRIVATE AREAS.  PT'S CBC, BMET, EKG, CXR REPORTS FROM [facility] 10/29/12 ARE IN EPIC AND OK TO USE FOR THIS SURGERY AND HER NUCLEAR STRESS TEST RESULTS FROM 11/05/12 ARE IN EPIC.  PT'S PCR RESULTS WERE POSITIVE FOR [facility]--SHE STATES SHE STARTED THE MUPIROCIN OINTMENT TX --BUT STOPPED AFTER 2 DAYS BECAUSE HER SURGERY WAS CANCELLED.  PT INSTRUCTED TO START THE MUPIROCIN TX TODAY AND CONTINUE FOR 5 DAYS.

## 2012-11-19 ENCOUNTER — Encounter (INDEPENDENT_AMBULATORY_CARE_PROVIDER_SITE_OTHER): Payer: Self-pay

## 2012-11-27 ENCOUNTER — Encounter (HOSPITAL_COMMUNITY): Payer: Self-pay | Admitting: *Deleted

## 2012-11-27 ENCOUNTER — Encounter (HOSPITAL_COMMUNITY): Payer: Self-pay | Admitting: Anesthesiology

## 2012-11-27 ENCOUNTER — Ambulatory Visit (HOSPITAL_COMMUNITY): Payer: Medicare Other | Admitting: Anesthesiology

## 2012-11-27 ENCOUNTER — Ambulatory Visit (HOSPITAL_COMMUNITY)
Admission: RE | Admit: 2012-11-27 | Discharge: 2012-11-28 | Disposition: A | Discharge status: Home / Self Care | Payer: Medicare Other | Source: Ambulatory Visit | Attending: General Surgery | Admitting: General Surgery

## 2012-11-27 ENCOUNTER — Encounter (HOSPITAL_COMMUNITY)
Admission: RE | Disposition: A | Discharge status: Home / Self Care | Payer: Self-pay | Source: Ambulatory Visit | Attending: General Surgery

## 2012-11-27 DIAGNOSIS — E119 Type 2 diabetes mellitus without complications: Secondary | ICD-10-CM | POA: Insufficient documentation

## 2012-11-27 DIAGNOSIS — E041 Nontoxic single thyroid nodule: Secondary | ICD-10-CM | POA: Insufficient documentation

## 2012-11-27 DIAGNOSIS — Z7982 Long term (current) use of aspirin: Secondary | ICD-10-CM | POA: Insufficient documentation

## 2012-11-27 DIAGNOSIS — E890 Postprocedural hypothyroidism: Secondary | ICD-10-CM

## 2012-11-27 DIAGNOSIS — D34 Benign neoplasm of thyroid gland: Secondary | ICD-10-CM

## 2012-11-27 DIAGNOSIS — Z79899 Other long term (current) drug therapy: Secondary | ICD-10-CM | POA: Insufficient documentation

## 2012-11-27 DIAGNOSIS — E7800 Pure hypercholesterolemia, unspecified: Secondary | ICD-10-CM | POA: Insufficient documentation

## 2012-11-27 DIAGNOSIS — Z9889 Other specified postprocedural states: Secondary | ICD-10-CM

## 2012-11-27 HISTORY — DX: Abnormal electrocardiogram (ECG) (EKG): R94.31

## 2012-11-27 HISTORY — PX: THYROID LOBECTOMY: SHX420

## 2012-11-27 HISTORY — DX: Unspecified corneal ulcer, unspecified eye: H16.009

## 2012-11-27 HISTORY — DX: Gastro-esophageal reflux disease without esophagitis: K21.9

## 2012-11-27 LAB — GLUCOSE, CAPILLARY
Glucose-Capillary: 169 mg/dL — ABNORMAL HIGH (ref 70–99)
Glucose-Capillary: 152 mg/dL — ABNORMAL HIGH (ref 70–99)

## 2012-11-27 SURGERY — LOBECTOMY, THYROID
Anesthesia: General | Site: Neck | Laterality: Right | Wound class: Clean

## 2012-11-27 MED ORDER — PROPOFOL 10 MG/ML IV BOLUS
INTRAVENOUS | Status: DC | PRN
  Administered 2012-11-27: 200 mg via INTRAVENOUS

## 2012-11-27 MED ORDER — HYDROCHLOROTHIAZIDE 25 MG PO TABS
25.0000 mg | ORAL_TABLET | Freq: Every day | ORAL | Status: DC
  Filled 2012-11-27 (×2): qty 1

## 2012-11-27 MED ORDER — METFORMIN HCL 500 MG PO TABS
1000.0000 mg | ORAL_TABLET | Freq: Two times a day (BID) | ORAL | Status: DC
  Administered 2012-11-27: 1000 mg via ORAL
  Filled 2012-11-27 (×4): qty 2

## 2012-11-27 MED ORDER — ACETAMINOPHEN 10 MG/ML IV SOLN
INTRAVENOUS | Status: DC | PRN
  Administered 2012-11-27: 1000 mg via INTRAVENOUS

## 2012-11-27 MED ORDER — GLIPIZIDE 5 MG PO TABS
5.0000 mg | ORAL_TABLET | Freq: Two times a day (BID) | ORAL | Status: DC
  Administered 2012-11-27: 5 mg via ORAL
  Filled 2012-11-27 (×4): qty 1

## 2012-11-27 MED ORDER — DIPHENHYDRAMINE HCL 50 MG/ML IJ SOLN: 12.5000 mg | Freq: Four times a day (QID) | INTRAMUSCULAR | Status: DC | PRN

## 2012-11-27 MED ORDER — HYDROMORPHONE HCL PF 1 MG/ML IJ SOLN
0.2500 mg | INTRAMUSCULAR | Status: DC | PRN
  Administered 2012-11-27 (×2): 0.5 mg via INTRAVENOUS

## 2012-11-27 MED ORDER — OXYCODONE-ACETAMINOPHEN 5-325 MG PO TABS: 1.0000 | ORAL_TABLET | ORAL | Status: DC | PRN

## 2012-11-27 MED ORDER — EPHEDRINE SULFATE 50 MG/ML IJ SOLN
INTRAMUSCULAR | Status: DC | PRN
  Administered 2012-11-27 (×3): 10 mg via INTRAVENOUS

## 2012-11-27 MED ORDER — HYDROMORPHONE HCL PF 1 MG/ML IJ SOLN
0.5000 mg | INTRAMUSCULAR | Status: DC | PRN
  Administered 2012-11-27 – 2012-11-28 (×4): 0.5 mg via INTRAVENOUS
  Filled 2012-11-27 (×4): qty 1

## 2012-11-27 MED ORDER — KETOROLAC TROMETHAMINE 30 MG/ML IJ SOLN
INTRAMUSCULAR | Status: DC | PRN
  Administered 2012-11-27: 30 mg via INTRAVENOUS

## 2012-11-27 MED ORDER — SUFENTANIL CITRATE 50 MCG/ML IV SOLN
INTRAVENOUS | Status: DC | PRN
  Administered 2012-11-27: 20 ug via INTRAVENOUS

## 2012-11-27 MED ORDER — DIPHENHYDRAMINE HCL 12.5 MG/5ML PO ELIX: 12.5000 mg | ORAL_SOLUTION | Freq: Four times a day (QID) | ORAL | Status: DC | PRN

## 2012-11-27 MED ORDER — HYDROMORPHONE HCL PF 1 MG/ML IJ SOLN
INTRAMUSCULAR | Status: AC
  Filled 2012-11-27: qty 1

## 2012-11-27 MED ORDER — CHLORHEXIDINE GLUCONATE 4 % EX LIQD
1.0000 "application " | Freq: Once | CUTANEOUS | Status: DC
  Filled 2012-11-27: qty 15

## 2012-11-27 MED ORDER — GLYCOPYRROLATE 0.2 MG/ML IJ SOLN
INTRAMUSCULAR | Status: DC | PRN
  Administered 2012-11-27: .6 mg via INTRAVENOUS

## 2012-11-27 MED ORDER — ONDANSETRON HCL 4 MG/2ML IJ SOLN
INTRAMUSCULAR | Status: DC | PRN
  Administered 2012-11-27: 4 mg via INTRAVENOUS

## 2012-11-27 MED ORDER — BUPIVACAINE-EPINEPHRINE 0.25% -1:200000 IJ SOLN
INTRAMUSCULAR | Status: AC
  Filled 2012-11-27: qty 1

## 2012-11-27 MED ORDER — ZOLPIDEM TARTRATE 5 MG PO TABS: 5.0000 mg | ORAL_TABLET | Freq: Every day | ORAL | Status: DC

## 2012-11-27 MED ORDER — PROMETHAZINE HCL 25 MG/ML IJ SOLN: 6.2500 mg | INTRAMUSCULAR | Status: DC | PRN

## 2012-11-27 MED ORDER — CEFAZOLIN SODIUM-DEXTROSE 2-3 GM-% IV SOLR
2.0000 g | INTRAVENOUS | Status: AC
  Administered 2012-11-27: 2 g via INTRAVENOUS

## 2012-11-27 MED ORDER — ACETAMINOPHEN 10 MG/ML IV SOLN
INTRAVENOUS | Status: AC
  Filled 2012-11-27: qty 100

## 2012-11-27 MED ORDER — LACTATED RINGERS IV SOLN
INTRAVENOUS | Status: DC
  Administered 2012-11-27: 14:00:00 via INTRAVENOUS
  Administered 2012-11-27: 1000 mL via INTRAVENOUS

## 2012-11-27 MED ORDER — 0.9 % SODIUM CHLORIDE (POUR BTL) OPTIME
TOPICAL | Status: DC | PRN
  Administered 2012-11-27: 1000 mL

## 2012-11-27 MED ORDER — SUCCINYLCHOLINE CHLORIDE 20 MG/ML IJ SOLN
INTRAMUSCULAR | Status: DC | PRN
  Administered 2012-11-27: 100 mg via INTRAVENOUS

## 2012-11-27 MED ORDER — NEOSTIGMINE METHYLSULFATE 1 MG/ML IJ SOLN
INTRAMUSCULAR | Status: DC | PRN
  Administered 2012-11-27: 4 mg via INTRAVENOUS

## 2012-11-27 MED ORDER — CEFAZOLIN SODIUM-DEXTROSE 2-3 GM-% IV SOLR
INTRAVENOUS | Status: AC
  Filled 2012-11-27: qty 50

## 2012-11-27 MED ORDER — ONDANSETRON HCL 4 MG/2ML IJ SOLN: 4.0000 mg | Freq: Four times a day (QID) | INTRAMUSCULAR | Status: DC | PRN

## 2012-11-27 MED ORDER — HYDROCODONE-ACETAMINOPHEN 5-325 MG PO TABS
1.0000 | ORAL_TABLET | ORAL | Status: DC | PRN
  Administered 2012-11-28: 1 via ORAL
  Filled 2012-11-27: qty 1

## 2012-11-27 MED ORDER — LIDOCAINE HCL 4 % MT SOLN
OROMUCOSAL | Status: DC | PRN
  Administered 2012-11-27: 4 mL via TOPICAL

## 2012-11-27 MED ORDER — CISATRACURIUM BESYLATE (PF) 10 MG/5ML IV SOLN
INTRAVENOUS | Status: DC | PRN
  Administered 2012-11-27: 6 mg via INTRAVENOUS

## 2012-11-27 MED ORDER — LISINOPRIL 20 MG PO TABS
20.0000 mg | ORAL_TABLET | Freq: Every day | ORAL | Status: DC
  Filled 2012-11-27 (×2): qty 1

## 2012-11-27 MED ORDER — BUPIVACAINE-EPINEPHRINE 0.25% -1:200000 IJ SOLN
INTRAMUSCULAR | Status: DC | PRN
  Administered 2012-11-27: 4 mL

## 2012-11-27 MED ORDER — MIDAZOLAM HCL 5 MG/5ML IJ SOLN
INTRAMUSCULAR | Status: DC | PRN
  Administered 2012-11-27: 2 mg via INTRAVENOUS

## 2012-11-27 MED ORDER — LISINOPRIL-HYDROCHLOROTHIAZIDE 20-25 MG PO TABS: 1.0000 | ORAL_TABLET | Freq: Every day | ORAL | Status: DC

## 2012-11-27 SURGICAL SUPPLY — 50 items
ADH SKN CLS APL DERMABOND .7 (GAUZE/BANDAGES/DRESSINGS) ×1
APL SKNCLS STERI-STRIP NONHPOA (GAUZE/BANDAGES/DRESSINGS)
ATTRACTOMAT 16X20 MAGNETIC DRP (DRAPES) ×2 IMPLANT
BENZOIN TINCTURE PRP APPL 2/3 (GAUZE/BANDAGES/DRESSINGS) ×1 IMPLANT
BLADE HEX COATED 2.75 (ELECTRODE) ×2 IMPLANT
BLADE SURG 15 STRL LF DISP TIS (BLADE) ×1 IMPLANT
BLADE SURG 15 STRL SS (BLADE) ×2
CANISTER SUCTION 2500CC (MISCELLANEOUS) ×2 IMPLANT
CHLORAPREP W/TINT 10.5 ML (MISCELLANEOUS) ×1 IMPLANT
CHLORAPREP W/TINT 26ML (MISCELLANEOUS) ×1 IMPLANT
CLIP TI MEDIUM 6 (CLIP) ×4 IMPLANT
CLIP TI WIDE RED SMALL 6 (CLIP) ×5 IMPLANT
CLOTH BEACON ORANGE TIMEOUT ST (SAFETY) ×2 IMPLANT
CLSR STERI-STRIP ANTIMIC 1/2X4 (GAUZE/BANDAGES/DRESSINGS) ×1 IMPLANT
DERMABOND ADVANCED (GAUZE/BANDAGES/DRESSINGS) ×1
DERMABOND ADVANCED .7 DNX12 (GAUZE/BANDAGES/DRESSINGS) IMPLANT
DISSECTOR ROUND CHERRY 3/8 STR (MISCELLANEOUS) ×1 IMPLANT
DRAPE PED LAPAROTOMY (DRAPES) ×2 IMPLANT
DRESSING SURGICEL FIBRLLR 1X2 (HEMOSTASIS) ×1 IMPLANT
DRSG SURGICEL FIBRILLAR 1X2 (HEMOSTASIS) ×2
ELECT REM PT RETURN 9FT ADLT (ELECTROSURGICAL) ×2
ELECTRODE REM PT RTRN 9FT ADLT (ELECTROSURGICAL) ×1 IMPLANT
GAUZE SPONGE 4X4 16PLY XRAY LF (GAUZE/BANDAGES/DRESSINGS) ×2 IMPLANT
GLOVE SURG ORTHO 8.0 STRL STRW (GLOVE) ×2 IMPLANT
GOWN STRL NON-REIN LRG LVL3 (GOWN DISPOSABLE) ×2 IMPLANT
GOWN STRL REIN XL XLG (GOWN DISPOSABLE) ×4 IMPLANT
KIT BASIN OR (CUSTOM PROCEDURE TRAY) ×2 IMPLANT
NDL SAFETY ECLIPSE 18X1.5 (NEEDLE) IMPLANT
NEEDLE HYPO 18GX1.5 SHARP (NEEDLE)
NEEDLE HYPO 22GX1.5 SAFETY (NEEDLE) ×1 IMPLANT
NS IRRIG 1000ML POUR BTL (IV SOLUTION) ×2 IMPLANT
PACK BASIC VI WITH GOWN DISP (CUSTOM PROCEDURE TRAY) ×2 IMPLANT
PENCIL BUTTON HOLSTER BLD 10FT (ELECTRODE) ×2 IMPLANT
SHEARS HARMONIC 9CM CVD (BLADE) ×2 IMPLANT
SPONGE GAUZE 4X4 12PLY (GAUZE/BANDAGES/DRESSINGS) ×1 IMPLANT
STAPLER VISISTAT 35W (STAPLE) ×1 IMPLANT
STRIP CLOSURE SKIN 1/2X4 (GAUZE/BANDAGES/DRESSINGS) ×1 IMPLANT
SUT MNCRL AB 4-0 PS2 18 (SUTURE) ×2 IMPLANT
SUT SILK 2 0 (SUTURE) ×2
SUT SILK 2-0 18XBRD TIE 12 (SUTURE) ×1 IMPLANT
SUT SILK 3 0 (SUTURE)
SUT SILK 3-0 18XBRD TIE 12 (SUTURE) IMPLANT
SUT VIC AB 3-0 SH 18 (SUTURE) ×3 IMPLANT
SYR BULB IRRIGATION 50ML (SYRINGE) ×2 IMPLANT
SYR CONTROL 10ML LL (SYRINGE) ×1 IMPLANT
SYRINGE 10CC LL (SYRINGE) IMPLANT
TAPE CLOTH SURG 4X10 WHT LF (GAUZE/BANDAGES/DRESSINGS) ×1 IMPLANT
TOWEL OR 17X26 10 PK STRL BLUE (TOWEL DISPOSABLE) ×2 IMPLANT
TOWEL OR NON WOVEN STRL DISP B (DISPOSABLE) ×1 IMPLANT
YANKAUER SUCT BULB TIP 10FT TU (MISCELLANEOUS) ×2 IMPLANT

## 2012-11-27 NOTE — H&P (Signed)
HPI  Whitney Morales is a 72 y.o. female. The patient is a 72 year old female who is referred by Dr. Tanya Nones for a right thyroid mass. The patient is she's had no symptoms of hypothyroidism and has noticed a mass in the right area of her neck. Patient was worked up and was found to have an elevated TSH of 5.104. Patient also ultrasound which revealed 2.8 cm right thyroid complex cystic lesion. There was no biopsy done on this at this point. Patient was patient otherwise notes no subjective changes in temperature no changes, or bone pain.  HPI  Past Medical History   Diagnosis  Date   .  Diverticulitis    .  Diabetes mellitus    .  Hypertension    .  High cholesterol    .  Fatigue    .  Obesity     Past Surgical History   Procedure  Date   .  Abdominal hysterectomy    .  Breast lumpectomy    .  Tonsillectomy    .  US echocardiography  03/23/2006     EF 55-60%   .  Cardiovascular stress test  06/08/2009     EF 78%, NO ISCHEMIA   No family history on file.  Social History  History   Substance Use Topics   .  Smoking status:  Never Smoker   .  Smokeless tobacco:  Not on file   .  Alcohol Use:  No    Allergies   Allergen  Reactions   .  Metronidazole Hcl     Current Outpatient Prescriptions   Medication  Sig  Dispense  Refill   .  aspirin 81 MG tablet  Take 81 mg by mouth daily.     .  eszopiclone (LUNESTA) 1 MG TABS  Take 1 mg by mouth at bedtime. Take immediately before bedtime     .  fluticasone (FLONASE) 50 MCG/ACT nasal spray  Place 2 sprays into the nose 1 day or 1 dose.     Marland Kitchen  glipiZIDE (GLUCOTROL) 5 MG tablet  Take 5 mg by mouth 2 (two) times daily before a meal.     .  lisinopril-hydrochlorothiazide (PRINZIDE,ZESTORETIC) 20-25 MG per tablet  Take 1 tablet by mouth daily.     .  metFORMIN (GLUCOPHAGE) 500 MG tablet  Take 500 mg by mouth 2 (two) times daily with a meal.     .  simvastatin (ZOCOR) 40 MG tablet  Take 40 mg by mouth at bedtime.     Review of Systems  Review  of Systems  Constitutional: Negative.  HENT: Negative.  Eyes: Negative.  Respiratory: Negative.  Cardiovascular: Negative.  Gastrointestinal: Negative.  Musculoskeletal: Negative.  Neurological: Negative.  Blood pressure 170/90, pulse 80, temperature 98.4 F (36.9 C), resp. rate 18, height 5\' 3"  (1.6 m), weight 230 lb (104.327 kg).  Physical Exam  Physical Exam  Constitutional: She is oriented to person, place, and time. She appears well-developed and well-nourished.  HENT:  Head: Normocephalic and atraumatic.  Eyes: Conjunctivae normal and EOM are normal. Pupils are equal, round, and reactive to light.  Neck: Normal range of motion. Neck supple.   Cardiovascular: Normal rate, regular rhythm and normal heart sounds.  Pulmonary/Chest: Effort normal and breath sounds normal.  Abdominal: Soft. Bowel sounds are normal.  Musculoskeletal: Normal range of motion.  Neurological: She is alert and oriented to person, place, and time.  Data Reviewed  Ultrasound revealing a 3.8 cm right thyroid  complex cystic lesion ; TSH of 5.104 (H)  Assessment  The patient is a 72 year old female with a right Thyroid mass. Her ultrasound and lab for studies have been reviewed. Secondary to patient's large right thyroid mass which is likely secondary to goiter, I would still recommend a FNA biopsy. A long discussion with the patient and her husband and described a stepwise fashion with biopsy, biopsy report, and then set up for surgery of the likely right hemithyroidectomy should the pathology results revealed benign or questionable results. Let's proceed for a total thyroidectomy should the biopsy revealed cancer. She has no cancer within her family, or thyroid cancer.  Plan  1. Will proceed to the OR for her right upper lobectomy versus total thyroidectomy. The patient and her husband were given a handout regarding the thyroid and thyroid goiters

## 2012-11-27 NOTE — Anesthesia Postprocedure Evaluation (Signed)
  Anesthesia Post-op Note  Patient: Whitney Morales  Procedure(s) Performed: Procedure(s) (LRB): THYROID LOBECTOMY (Right)  Patient Location: PACU  Anesthesia Type: General  Level of Consciousness: awake and alert   Airway and Oxygen Therapy: Patient Spontanous Breathing  Post-op Pain: mild  Post-op Assessment: Post-op Vital signs reviewed, Patient's Cardiovascular Status Stable, Respiratory Function Stable, Patent Airway and No signs of Nausea or vomiting  Last Vitals:  Filed Vitals:   11/27/12 1600  BP: 161/58  Pulse: 80  Temp: 36.8 C  Resp: 10    Post-op Vital Signs: stable   Complications: No apparent anesthesia complications

## 2012-11-27 NOTE — Transfer of Care (Signed)
Immediate Anesthesia Transfer of Care Note  Patient: Whitney Morales  Procedure(s) Performed: Procedure(s) (LRB) with comments: THYROID LOBECTOMY (Right) - Right Thyroid Lobectomy  Patient Location: PACU  Anesthesia Type:General  Level of Consciousness: awake, alert  and oriented  Airway & Oxygen Therapy: Patient Spontanous Breathing and Patient connected to face mask oxygen  Post-op Assessment: Report given to PACU RN  Post vital signs: Reviewed and stable  Complications: No apparent anesthesia complications

## 2012-11-27 NOTE — Anesthesia Procedure Notes (Signed)
Procedure Name: Intubation Date/Time: 11/27/2012 1:33 PM Performed by: Leroy Libman L Patient Re-evaluated:Patient Re-evaluated prior to inductionOxygen Delivery Method: Circle system utilized Preoxygenation: Pre-oxygenation with 100% oxygen Intubation Type: IV induction Ventilation: Mask ventilation without difficulty and Oral airway inserted - appropriate to patient size Laryngoscope Size: Hyacinth Meeker and 2 Grade View: Grade I Tube type: Reinforced Tube size: 7.5 mm Number of attempts: 1 Airway Equipment and Method: Stylet and LTA kit utilized Placement Confirmation: ETT inserted through vocal cords under direct vision,  breath sounds checked- equal and bilateral and positive ETCO2 Secured at: 21 cm Tube secured with: Tape Dental Injury: Teeth and Oropharynx as per pre-operative assessment

## 2012-11-27 NOTE — Preoperative (Signed)
Beta Blockers   Reason not to administer Beta Blockers:Not Applicable 

## 2012-11-27 NOTE — Anesthesia Preprocedure Evaluation (Addendum)
Anesthesia Evaluation  Patient identified by MRN, date of birth, ID band Patient awake    Reviewed: Allergy & Precautions, H&P , NPO status , Patient's Chart, lab work & pertinent test results  Airway Mallampati: II TM Distance: >3 FB Neck ROM: Full    Dental No notable dental hx.    Pulmonary neg pulmonary ROS,  breath sounds clear to auscultation  Pulmonary exam normal       Cardiovascular Exercise Tolerance: Good hypertension, Pt. on medications Rhythm:Regular Rate:Normal  Clearance Dr. Elease Hashimoto per patient.  Normal stress nuclear study 11/05/12   Neuro/Psych negative neurological ROS  negative psych ROS   GI/Hepatic Neg liver ROS, hiatal hernia, GERD-  ,  Endo/Other  diabetes, Type 2, Oral Hypoglycemic AgentsMorbid obesity  Renal/GU negative Renal ROS  negative genitourinary   Musculoskeletal negative musculoskeletal ROS (+)   Abdominal (+) + obese,   Peds negative pediatric ROS (+)  Hematology negative hematology ROS (+)   Anesthesia Other Findings   Reproductive/Obstetrics negative OB ROS                         Anesthesia Physical Anesthesia Plan  ASA: III  Anesthesia Plan: General   Post-op Pain Management:    Induction: Intravenous  Airway Management Planned:   Additional Equipment:   Intra-op Plan:   Post-operative Plan: Extubation in OR  Informed Consent: I have reviewed the patients History and Physical, chart, labs and discussed the procedure including the risks, benefits and alternatives for the proposed anesthesia with the patient or authorized representative who has indicated his/her understanding and acceptance.   Dental advisory given  Plan Discussed with: CRNA  Anesthesia Plan Comments:         Anesthesia Quick Evaluation

## 2012-11-27 NOTE — Op Note (Signed)
Pre Operative Diagnosis:  Right thyroid nodule  Post Operative Diagnosis: same  Procedure: right thyroid lobectomy  Surgeon: Dr. Axel Filler  Assistant: Dr. Biagio Quint  Anesthesia: GETA  EBL: 10 cc  Complications: none  Counts: reported as correct x 2  Findings:  The patient arrived thyroid cyst. The right recurrent nerve was then applied and protected. The right superior and inferior parathyroids were also identified and protected and preserved.  Indications for procedure:  The patient is a 72 year old female who had a right thyroid nodule. This was worked up with ultrasound as well as FNA biopsy. The patient was counseled on right thyroid lobectomy and said to have this electively removed.  Details of the procedure:The patient was taken back to the operating room. The patient was placed in supine position with bilateral SCDs in place. After appropriate anitbiotics were confirmed, a time-out was confirmed and all facts were verified.  a 4 cm incision was made approximately 2 fingerbreadths above the sternal notch. Bovie cautery was used to maintain hemostasis dissection was carried down through the platysma. The platysma was elevated and flaps were created superiorly and inferiorly to the thyroid cartilage as well as the sternal notch. The strap muscles were identified in the midline and separated. Right-sided strap muscles were elevated off the anterior surface of the thyroid. This dissection was carried laterally. The middle thyroid vein was identified and doubly ligated. We proceeded to dissect away the superior lobe and Kitners were used to gently dissect the surrounding musculature from the thyroid. The superior thyroid vessels were identified and doubly ligated with 3-0 silk ties. At this time this freed up the superior lobe was able to deliver this into the wound. We also identified the superior parathyroid gland which we preserved. We continued to dissect the thyroid off of the trachea  from lateral to medial direction. The right recurrent laryngeal nerve was identified and protected. We proceeded to dissect the thyroid inferiorly and inferior parathyroid was identified and preserved. The inferior thyroid vessels were identified and doubly ligated with clips. At this time various ligament was dissected away from the trachea. This deliver the right lobe of the thyroid into the wound the harmonic scalpel was used to divide the thyroid in the midline. The superior stitch was then placed in the superior thyroid lobe. The area was irrigated out. The dissection bed was hemostatic. We placed fibrillar hemostatic agent into the wound. Strap muscles were then reapproximated in the midline with interrupted 3-0 Vicryl stitches. The platysma was reapproximated using 3-0 Vicryl stitches in interrupted fashion. Skin was then reapproximated using a running subcuticular 4-0 Monocryl. The skin was then dressed with Dermabond.  The patient was taken to the recovery room in stable condition.

## 2012-11-28 ENCOUNTER — Encounter (HOSPITAL_COMMUNITY): Payer: Self-pay | Admitting: General Surgery

## 2012-11-28 LAB — GLUCOSE, CAPILLARY: Glucose-Capillary: 160 mg/dL — ABNORMAL HIGH (ref 70–99)

## 2012-11-28 MED ORDER — OXYCODONE-ACETAMINOPHEN 5-325 MG PO TABS: 1.0000 | ORAL_TABLET | ORAL | Status: DC | PRN

## 2012-11-28 NOTE — Care Management Note (Signed)
    Page 1 of 1   11/28/2012     10:30:20 AM   CARE MANAGEMENT NOTE 11/28/2012  Patient:  Whitney Morales, Whitney Morales   Account Number:  [id number]  Date Initiated:  11/28/2012  Documentation initiated by:  Lorenda Ishihara  Subjective/Objective Assessment:   72 yo female admitted s/p thyroid lobectomy. PTA lived at home with spouse.     Action/Plan:   Home when stable   Anticipated DC Date:  11/28/2012   Anticipated DC Plan:  HOME/SELF CARE      DC Planning Services  CM consult      Choice offered to / List presented to:             Status of service:  Completed, signed off Medicare Important Message given?   (If response is "NO", the following Medicare IM given date fields will be blank) Date Medicare IM given:   Date Additional Medicare IM given:    Discharge Disposition:  HOME/SELF CARE  Per UR Regulation:  Reviewed for med. necessity/level of care/duration of stay  If discussed at Long Length of Stay Meetings, dates discussed:    Comments:

## 2012-11-28 NOTE — Discharge Summary (Signed)
Physician Discharge Summary  Patient ID: Whitney Morales MRN: [id number] DOB/AGE: [birth year] 70 y.o.  Admit date: 11/27/2012 Discharge date: 11/28/2012  Admission Diagnoses: s/p Thyroid lobectomy  Discharge Diagnoses: s/p thyroid lobectomy  Active Problems:  * No active hospital problems. *    Discharged Condition: good  Hospital Course: Pt was admitted post op.  No problems in overnight stay.  Pt tol PO.  Pain controlled.  AMbulating well  Consults: None  Significant Diagnostic Studies:   Treatments: surgery: 2.5.14 s/p thyroidectomy  Discharge Exam: Blood pressure 137/79, pulse 71, temperature 98.1 F (36.7 C), temperature source Oral, resp. rate 18, height 5\' 3"  (1.6 m), weight 224 lb 2 oz (101.662 kg), SpO2 97.00%. General appearance: alert and cooperative Throat: wound c/d/i,  Cardio: regular rate and rhythm, S1, S2 normal, no murmur, click, rub or gallop GI: soft, non-tender; bowel sounds normal; no masses,  no organomegaly  Disposition: 01-Home or Self Care  Discharge Orders    Future Appointments: Provider: Department: Dept Phone: Center:   12/12/2012 11:20 AM Axel Filler, MD [facility] [phone] None       Medication List     As of 11/28/2012  8:23 AM    TAKE these medications         oxyCODONE-acetaminophen 5-325 MG per tablet   Commonly known as: PERCOCET/ROXICET   Take 1 tablet by mouth every 4 (four) hours as needed for pain.      ASK your doctor about these medications         aspirin 81 MG tablet   Take 81 mg by mouth daily.      eszopiclone 1 MG Tabs   Commonly known as: LUNESTA   Take 1 mg by mouth as needed. Take immediately before bedtime as needed, patient takes mainly when traveling      fluticasone 50 MCG/ACT nasal spray   Commonly known as: FLONASE   Place 2 sprays into the nose as needed.      glipiZIDE 5 MG tablet   Commonly known as: GLUCOTROL   Take 5 mg by mouth 2 (two) times daily before a meal.     lisinopril-hydrochlorothiazide 20-25 MG per tablet   Commonly known as: PRINZIDE,ZESTORETIC   Take 1 tablet by mouth daily.      metFORMIN 500 MG tablet   Commonly known as: GLUCOPHAGE   Take 1,000 mg by mouth 2 (two) times daily with a meal.      multivitamin with minerals Tabs   Take 1 tablet by mouth daily.      simvastatin 80 MG tablet   Commonly known as: ZOCOR   Take 40 mg by mouth at bedtime. Takes 1/2 tablet      TUMS PO   Take by mouth at bedtime. RARELY WOULD NEED ADDITIONAL TUMS DURING DAY IF SHE ATE SPICY FOODS           Follow-up Information    Follow up with Lajean Saver, MD. In 2 weeks.   Contact information:   [address] [place] [address] [phone]          Signed: Marigene Ehlers., Jed Limerick 11/28/2012, 8:23 AM

## 2012-11-28 NOTE — Progress Notes (Signed)
Assessment unchanged. Pt ate breakfast and walked in hall 360 ft with out any complaints of nausea or otherwise. Also tolerated pain pill x1 for mild to moderate soreness rated 4 out of 10 at anterior neck incision prior to dc home. AVS reviewed and dc instructions given with verbalized understanding from pt and husband. Pt able to tell nurse when follow-up appointment was scheduled. Introduced to My Chart. Pt's husband stated "We have a computer, so we can sign up." Script x1 given as provided by MD. Pt discharged via wheelchair to front entrance to meet awaiting vehicle to carry home. Accompanied by husband and NT.

## 2012-11-29 ENCOUNTER — Encounter: Payer: Self-pay | Admitting: Cardiovascular Disease

## 2012-11-29 ENCOUNTER — Telehealth (INDEPENDENT_AMBULATORY_CARE_PROVIDER_SITE_OTHER): Payer: Self-pay | Admitting: General Surgery

## 2012-11-29 NOTE — Telephone Encounter (Signed)
Pt called to ask about some itching at the wound site and a square, red patch just below the incision.  She denies pain, and is still using the ice pack to the site.  Asked if all sounded okay for this to be POD #2.  Reassured the pt she is experiencing normal symptoms.  She will call back

## 2012-12-12 ENCOUNTER — Ambulatory Visit (INDEPENDENT_AMBULATORY_CARE_PROVIDER_SITE_OTHER): Payer: Medicare Other | Admitting: General Surgery

## 2012-12-12 ENCOUNTER — Encounter (INDEPENDENT_AMBULATORY_CARE_PROVIDER_SITE_OTHER): Payer: Self-pay | Admitting: General Surgery

## 2012-12-12 VITALS — BP 144/82 | HR 74 | Temp 97.5°F | Resp 16 | Ht 63.0 in | Wt 223.2 lb

## 2012-12-12 DIAGNOSIS — Z9009 Acquired absence of other part of head and neck: Secondary | ICD-10-CM

## 2012-12-12 DIAGNOSIS — E890 Postprocedural hypothyroidism: Secondary | ICD-10-CM

## 2012-12-12 DIAGNOSIS — Z9889 Other specified postprocedural states: Secondary | ICD-10-CM

## 2012-12-12 NOTE — Progress Notes (Signed)
Patient ID: Whitney Morales, female   DOB: 1940/12/14, 72 y.o.   MRN: [id number] The patient is a 72 year old female status post right thyroid lobectomy secondary to right thyroid nodule.  The patient has been doing well postoperatively. She has no neck pain in her voice is normal as per her husband.  On exam: Her wound is clean dry and intact  Pathology:  Benign thyroid tissue, colloid cyst  Assessment and plan: 72 year old female status post right thyroid lobectomy 1.  Patient followup when necessary 2. Patient to resume any exercises

## 2013-01-04 ENCOUNTER — Encounter (HOSPITAL_COMMUNITY): Payer: Self-pay

## 2013-01-04 ENCOUNTER — Emergency Department (HOSPITAL_COMMUNITY)
Admission: EM | Admit: 2013-01-04 | Discharge: 2013-01-04 | Disposition: A | Discharge status: Home / Self Care | Payer: Medicare Other | Source: Home / Self Care

## 2013-01-04 ENCOUNTER — Emergency Department (INDEPENDENT_AMBULATORY_CARE_PROVIDER_SITE_OTHER): Payer: Medicare Other

## 2013-01-04 DIAGNOSIS — S8000XA Contusion of unspecified knee, initial encounter: Secondary | ICD-10-CM

## 2013-01-04 DIAGNOSIS — S8002XA Contusion of left knee, initial encounter: Secondary | ICD-10-CM

## 2013-01-04 NOTE — ED Provider Notes (Signed)
History     CSN: [id number]  Arrival date & time 01/04/13  1144   First MD Initiated Contact with Patient 01/04/13 1246      Chief Complaint  Patient presents with  . Fall    (Consider location/radiation/quality/duration/timing/severity/associated sxs/prior treatment) HPI 72 y.o. female fell on pavement in parking lot about 3 hours ago. Landed on left knee. No LOC. Felt like there was a "goose egg" on her knee initially but able to bear weight. Pain is minimal at rest. No sharp pain but discomfort with moving knee.    Past Medical History  Diagnosis Date  . Diverticulitis   . Diabetes mellitus   . Hypertension   . High cholesterol   . Fatigue   . Obesity   . H/O hiatal hernia   . Goiter     cyst vs goiter on thyroid  . GERD (gastroesophageal reflux disease)     PAST HX--NO PROBLEMS NOW-PT DOES TAKE DAILY TUMS AT BEDTIME  . Abnormal finding on EKG 10/29/2012    FOLLOW UP NUCLEAR STRESS TEST ON 11/05/12 -NORMAL  . Corneal erosion 2012    RIGHT EYE--HAS RESOLVED--NO PROBLEMS SINCE    Past Surgical History  Procedure Laterality Date  . Abdominal hysterectomy    . Tonsillectomy    . US echocardiography  03/23/2006    EF 55-60%  . Cardiovascular stress test  06/08/2009    EF 78%, NO ISCHEMIA  . Breast lumpectomy      BENIGN  . Thyroid lobectomy  11/27/2012    Procedure: THYROID LOBECTOMY;  Surgeon: Axel Filler, MD;  Location: [facility];  Service: General;  Laterality: Right;  Right Thyroid Lobectomy    History reviewed. No pertinent family history.  History  Substance Use Topics  . Smoking status: Never Smoker   . Smokeless tobacco: Not on file  . Alcohol Use: No    OB History   Grav Para Term Preterm Abortions TAB SAB Ect Mult Living                  Review of Systems  All other systems reviewed and are negative.    Allergies  Clindamycin/lincomycin; Levaquin; and Metronidazole hcl  Home Medications   Current Outpatient Rx  Name  Route  Sig  Dispense   Refill  . aspirin 81 MG tablet   Oral   Take 81 mg by mouth daily.          . Calcium Carbonate Antacid (TUMS PO)   Oral   Take by mouth at bedtime. RARELY WOULD NEED ADDITIONAL TUMS DURING DAY IF SHE ATE SPICY FOODS         . eszopiclone (LUNESTA) 1 MG TABS   Oral   Take 1 mg by mouth as needed. Take immediately before bedtime as needed, patient takes mainly when traveling         . fluticasone (FLONASE) 50 MCG/ACT nasal spray   Nasal   Place 2 sprays into the nose as needed.          Marland Kitchen glipiZIDE (GLUCOTROL) 5 MG tablet   Oral   Take 5 mg by mouth 2 (two) times daily before a meal.          . lisinopril-hydrochlorothiazide (PRINZIDE,ZESTORETIC) 20-25 MG per tablet   Oral   Take 1 tablet by mouth daily.          . metFORMIN (GLUCOPHAGE) 500 MG tablet   Oral   Take 1,000 mg by mouth 2 (two) times  daily with a meal.          . Multiple Vitamin (MULTIVITAMIN WITH MINERALS) TABS   Oral   Take 1 tablet by mouth daily.         Marland Kitchen oxyCODONE-acetaminophen (ROXICET) 5-325 MG per tablet   Oral   Take 1 tablet by mouth every 4 (four) hours as needed for pain.   30 tablet   0   . simvastatin (ZOCOR) 80 MG tablet   Oral   Take 40 mg by mouth at bedtime. Takes 1/2 tablet           BP 139/64  Pulse 94  Temp(Src) 98.8 F (37.1 C) (Oral)  Resp 19  SpO2 97%  Physical Exam  Constitutional: She is oriented to person, place, and time. She appears well-developed and well-nourished. No distress.  HENT:  Head: Normocephalic and atraumatic.  Eyes: Conjunctivae and EOM are normal.  Cardiovascular: Normal rate.   Pulmonary/Chest: Effort normal. No respiratory distress.  Musculoskeletal:       Legs: Left knee with swelling over upper aspect of patella. Point tenderness over patella. Mild discomfort with passive flexion/extension. No joint line pain or crepitus.  Neurological: She is alert and oriented to person, place, and time.  Skin: Skin is warm and dry.    Psychiatric: She has a normal mood and affect.   *RADIOLOGY REPORT*  Clinical Data: Fall. Knee injury and pain. Soft tissue swelling.  LEFT KNEE - COMPLETE 4+ VIEW  Comparison: None.  Findings: There is no evidence of fracture, dislocation, or joint  effusion. There is no evidence of arthropathy or other focal bone  abnormality. Prepatellar soft tissue swelling is noted.  IMPRESSION:  Prepatellar soft tissue swelling. No evidence of fracture or joint  effusion.  Original Report Authenticated By: Myles Rosenthal, M.D.               ED Course  Procedures (including critical care time)  Labs Reviewed - No data to display No results found.   1. Contusion, knee, left, initial encounter   Follow up at ED or with PCP if still having pain in 1-2 weeks. May need f/u xray for hairline fracture of patella.  Ice, elevation, rest as needed.   Napoleon Form, MD     Napoleon Form, MD 01/04/13 [phone]

## 2013-01-04 NOTE — ED Notes (Signed)
States she fell while shopping earlier today, landed on her left knee ; c/o pain and swelling same

## 2013-01-27 ENCOUNTER — Telehealth: Payer: Self-pay | Admitting: Family Medicine

## 2013-01-27 MED ORDER — GLIPIZIDE 5 MG PO TABS: 5.0000 mg | ORAL_TABLET | Freq: Two times a day (BID) | ORAL | Status: DC

## 2013-01-27 NOTE — Telephone Encounter (Signed)
rx refilled.

## 2013-02-05 ENCOUNTER — Telehealth: Payer: Self-pay | Admitting: Family Medicine

## 2013-02-05 MED ORDER — METFORMIN HCL 500 MG PO TABS: 1000.0000 mg | ORAL_TABLET | Freq: Two times a day (BID) | ORAL | Status: DC

## 2013-02-05 NOTE — Telephone Encounter (Signed)
Rx Refilled  

## 2013-03-24 ENCOUNTER — Telehealth: Payer: Self-pay | Admitting: Family Medicine

## 2013-03-24 MED ORDER — LEVOTHYROXINE SODIUM 50 MCG PO TABS: 50.0000 ug | ORAL_TABLET | Freq: Every day | ORAL | Status: DC

## 2013-03-24 NOTE — Telephone Encounter (Signed)
Rx Refilled  

## 2013-03-24 NOTE — Telephone Encounter (Signed)
Levothyroxine take one tablet qd #30 last refill 02/23/13

## 2013-04-07 ENCOUNTER — Encounter: Payer: Self-pay | Admitting: Family Medicine

## 2013-04-07 ENCOUNTER — Ambulatory Visit (INDEPENDENT_AMBULATORY_CARE_PROVIDER_SITE_OTHER): Payer: Medicare Other | Admitting: Family Medicine

## 2013-04-07 VITALS — BP 140/82 | HR 86 | Temp 98.3°F | Resp 18 | Wt 231.0 lb

## 2013-04-07 DIAGNOSIS — R1032 Left lower quadrant pain: Secondary | ICD-10-CM

## 2013-04-07 LAB — CBC WITH DIFFERENTIAL/PLATELET
Basophils Absolute: 0.1 10*3/uL (ref 0.0–0.1)
Basophils Relative: 1 % (ref 0–1)
Eosinophils Absolute: 0.1 10*3/uL (ref 0.0–0.7)
Eosinophils Relative: 1 % (ref 0–5)
HCT: 38.3 % (ref 36.0–46.0)
Hemoglobin: 12.9 g/dL (ref 12.0–15.0)
Lymphocytes Relative: 12 % (ref 12–46)
Lymphs Abs: 1.8 10*3/uL (ref 0.7–4.0)
MCH: 29.6 pg (ref 26.0–34.0)
MCHC: 33.7 g/dL (ref 30.0–36.0)
MCV: 87.8 fL (ref 78.0–100.0)
Monocytes Absolute: 1.1 10*3/uL — ABNORMAL HIGH (ref 0.1–1.0)
Monocytes Relative: 8 % (ref 3–12)
Neutro Abs: 11.8 10*3/uL — ABNORMAL HIGH (ref 1.7–7.7)
Neutrophils Relative %: 78 % — ABNORMAL HIGH (ref 43–77)
Platelets: 297 10*3/uL (ref 150–400)
RBC: 4.36 MIL/uL (ref 3.87–5.11)
RDW: 13.1 % (ref 11.5–15.5)
WBC: 14.9 10*3/uL — ABNORMAL HIGH (ref 4.0–10.5)

## 2013-04-07 LAB — SEDIMENTATION RATE: Sed Rate: 19 mm/hr (ref 0–22)

## 2013-04-07 MED ORDER — HYOSCYAMINE SULFATE 0.125 MG SL SUBL: 0.1250 mg | SUBLINGUAL_TABLET | SUBLINGUAL | Status: DC | PRN

## 2013-04-07 NOTE — Progress Notes (Signed)
Subjective:    Patient ID: Whitney Morales, female    DOB: [birth year], 72 y.o.   MRN: [id number]  HPI  Patient reports 3 days of pain in her left lower quadrant. She denies any fever. She denies constipation. She denies any diarrhea. She denies any melena or hematochezia.  She denies nausea or vomiting. The pain does not radiate. She is also having suprapubic gas-like pain.  She has frequent bouts of abdominal pain which she has has been told was  diverticulitis in the past.  She has known diverticulosis on her colonoscopy in 2008. Past Medical History  Diagnosis Date  . Diverticulitis   . Diabetes mellitus   . Hypertension   . High cholesterol   . Fatigue   . Obesity   . H/O hiatal hernia   . Goiter     cyst vs goiter on thyroid  . GERD (gastroesophageal reflux disease)     PAST HX--NO PROBLEMS NOW-PT DOES TAKE DAILY TUMS AT BEDTIME  . Abnormal finding on EKG 10/29/2012    FOLLOW UP NUCLEAR STRESS TEST ON 11/05/12 -NORMAL  . 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 aspirin 81 MG tablet Take 81 mg by mouth daily.       . Calcium Carbonate Antacid (TUMS PO) Take by mouth at bedtime. RARELY WOULD NEED ADDITIONAL TUMS DURING DAY IF SHE ATE SPICY FOODS      . eszopiclone (LUNESTA) 1 MG TABS Take 1 mg by mouth as needed. Take immediately before bedtime as needed, patient takes mainly when traveling      . fluticasone (FLONASE) 50 MCG/ACT nasal spray Place 2 sprays into the nose as needed.       Marland Kitchen glipiZIDE (GLUCOTROL) 5 MG tablet Take 1 tablet (5 mg total) by mouth 2 (two) times daily before a meal.  180 tablet  1  . levothyroxine (SYNTHROID, LEVOTHROID) 50 MCG tablet Take 1 tablet (50 mcg total) by mouth daily before breakfast.  30 tablet  1  . lisinopril-hydrochlorothiazide (PRINZIDE,ZESTORETIC) 20-25 MG per tablet Take 1 tablet by mouth daily.       . metFORMIN  (GLUCOPHAGE) 500 MG tablet Take 2 tablets (1,000 mg total) by mouth 2 (two) times daily with a meal.  120 tablet  3  . Multiple Vitamin (MULTIVITAMIN WITH MINERALS) TABS Take 1 tablet by mouth daily.      . simvastatin (ZOCOR) 80 MG tablet Take 40 mg by mouth at bedtime. Takes 1/2 tablet       No current facility-administered medications on file prior to visit.   Allergies  Allergen Reactions  . Clindamycin/Lincomycin     CLINDAMYCIN TAKEN WITH CIPRO CAUSED SEVERE NAUSEA--PT STATES SHE CAN TAKE CIPRO ALONE WITHOUT PROBLEM--IT WAS JUST THE COMBINATION SHE DID NOT TOLERATE.  Marland Kitchen Levaquin (Levofloxacin) Nausea And Vomiting  . Metronidazole Hcl Other (See Comments)    Deathly sick WHEN PT TOOK METRONIDAZOLE WITH CIPRO.  PT STATES SHE HAS TAKEN CIPRO ALONE WITHOUT PROBLEM --JUST DID NOT TOLERATE THE COMBINATION OF BOTH DRUGS TAKEN TOGETHER.   History   Social History  . Marital Status: Married    Spouse Name: N/A    Number of Children: N/A  . Years of Education: N/A   Occupational History  . Not on file.   Social History Main Topics  . Smoking status: Never Smoker   . Smokeless tobacco: Not on  file  . Alcohol Use: No  . Drug Use: No  . Sexually Active:    Other Topics Concern  . Not on file   Social History Narrative  . No narrative on file     Review of Systems  All other systems reviewed and are negative.       Objective:   Physical Exam  Vitals reviewed. Constitutional: No distress.  Cardiovascular: Normal rate and regular rhythm.   Pulmonary/Chest: Effort normal and breath sounds normal. No respiratory distress.  Abdominal: Soft. Bowel sounds are normal. She exhibits no distension. There is no tenderness. There is no rebound and no guarding.  Skin: She is not diaphoretic.  Patient is smiling and laughing on exam.        Assessment & Plan:  1. Abdominal pain, left lower quadrant I do not fill that this is diverticulitis. I will obtain a CT scan of the abdomen  and pelvis to rule out diverticulitis. Also check a CBC as well as a sed rate to evaluate for signs of inflammation and leukocytosis. I believe the patient likely has irritable bowel syndrome. I would treat with Levsin 0.125 mg sublingual tablets one by mouth every 6 hours when necessary pain - CBC with Differential - Sedimentation rate - CT Abdomen Pelvis W Contrast; Future

## 2013-04-08 ENCOUNTER — Telehealth: Payer: Self-pay | Admitting: Family Medicine

## 2013-04-08 ENCOUNTER — Ambulatory Visit
Admission: RE | Admit: 2013-04-08 | Discharge: 2013-04-08 | Disposition: A | Discharge status: Home / Self Care | Payer: Medicare Other | Source: Ambulatory Visit | Attending: Family Medicine | Admitting: Family Medicine

## 2013-04-08 DIAGNOSIS — R1032 Left lower quadrant pain: Secondary | ICD-10-CM

## 2013-04-08 MED ORDER — AMOXICILLIN-POT CLAVULANATE 875-125 MG PO TABS: 1.0000 | ORAL_TABLET | Freq: Two times a day (BID) | ORAL | Status: DC

## 2013-04-08 MED ORDER — IOHEXOL 300 MG/ML  SOLN
125.0000 mL | Freq: Once | INTRAMUSCULAR | Status: AC | PRN
  Administered 2013-04-08: 125 mL via INTRAVENOUS

## 2013-04-08 NOTE — Telephone Encounter (Signed)
rx sent and pt aware of CT results

## 2013-04-14 ENCOUNTER — Other Ambulatory Visit: Payer: Self-pay | Admitting: Family Medicine

## 2013-04-14 ENCOUNTER — Telehealth: Payer: Self-pay | Admitting: Family Medicine

## 2013-04-14 ENCOUNTER — Ambulatory Visit: Payer: Self-pay | Admitting: Family Medicine

## 2013-04-14 MED ORDER — LISINOPRIL-HYDROCHLOROTHIAZIDE 20-25 MG PO TABS: 1.0000 | ORAL_TABLET | Freq: Every day | ORAL | Status: DC

## 2013-04-14 NOTE — Telephone Encounter (Signed)
Rx Refilled  

## 2013-04-14 NOTE — Telephone Encounter (Signed)
Med would not escribe so i called in med to pharmacy

## 2013-05-01 ENCOUNTER — Other Ambulatory Visit: Payer: Medicare Other

## 2013-05-01 DIAGNOSIS — E119 Type 2 diabetes mellitus without complications: Secondary | ICD-10-CM

## 2013-05-01 DIAGNOSIS — Z79899 Other long term (current) drug therapy: Secondary | ICD-10-CM

## 2013-05-01 DIAGNOSIS — E039 Hypothyroidism, unspecified: Secondary | ICD-10-CM

## 2013-05-01 DIAGNOSIS — E782 Mixed hyperlipidemia: Secondary | ICD-10-CM

## 2013-05-01 LAB — LIPID PANEL
Cholesterol: 154 mg/dL (ref 0–200)
HDL: 40 mg/dL (ref >39)
LDL Cholesterol: 88 mg/dL (ref 0–99)
Total CHOL/HDL Ratio: 3.9 Ratio
Triglycerides: 129 mg/dL (ref <150)
VLDL: 26 mg/dL (ref 0–40)

## 2013-05-01 LAB — CBC WITH DIFFERENTIAL/PLATELET
Basophils Absolute: 0.1 10*3/uL (ref 0.0–0.1)
Basophils Relative: 1 % (ref 0–1)
Eosinophils Absolute: 0.2 10*3/uL (ref 0.0–0.7)
Eosinophils Relative: 2 % (ref 0–5)
HCT: 37 % (ref 36.0–46.0)
Hemoglobin: 12.3 g/dL (ref 12.0–15.0)
Lymphocytes Relative: 28 % (ref 12–46)
Lymphs Abs: 2.1 10*3/uL (ref 0.7–4.0)
MCH: 29.2 pg (ref 26.0–34.0)
MCHC: 33.2 g/dL (ref 30.0–36.0)
MCV: 87.9 fL (ref 78.0–100.0)
Monocytes Absolute: 0.6 10*3/uL (ref 0.1–1.0)
Monocytes Relative: 8 % (ref 3–12)
Neutro Abs: 4.6 10*3/uL (ref 1.7–7.7)
Neutrophils Relative %: 61 % (ref 43–77)
Platelets: 302 10*3/uL (ref 150–400)
RBC: 4.21 MIL/uL (ref 3.87–5.11)
RDW: 13.2 % (ref 11.5–15.5)
WBC: 7.5 10*3/uL (ref 4.0–10.5)

## 2013-05-01 LAB — COMPREHENSIVE METABOLIC PANEL
ALT: 16 U/L (ref 0–35)
AST: 14 U/L (ref 0–37)
Albumin: 4.3 g/dL (ref 3.5–5.2)
Alkaline Phosphatase: 50 U/L (ref 39–117)
BUN: 17 mg/dL (ref 6–23)
CO2: 29 mEq/L (ref 19–32)
Calcium: 9.9 mg/dL (ref 8.4–10.5)
Chloride: 102 mEq/L (ref 96–112)
Creat: 1.08 mg/dL (ref 0.50–1.10)
Glucose, Bld: 175 mg/dL — ABNORMAL HIGH (ref 70–99)
Potassium: 4.3 mEq/L (ref 3.5–5.3)
Sodium: 143 mEq/L (ref 135–145)
Total Bilirubin: 0.3 mg/dL (ref 0.3–1.2)
Total Protein: 6.9 g/dL (ref 6.0–8.3)

## 2013-05-01 LAB — HEMOGLOBIN A1C
Hgb A1c MFr Bld: 7.3 % — ABNORMAL HIGH (ref <5.7)
Mean Plasma Glucose: 163 mg/dL — ABNORMAL HIGH (ref <117)

## 2013-05-01 LAB — TSH: TSH: 5.242 u[IU]/mL — ABNORMAL HIGH (ref 0.350–4.500)

## 2013-05-06 ENCOUNTER — Ambulatory Visit (INDEPENDENT_AMBULATORY_CARE_PROVIDER_SITE_OTHER): Payer: Medicare Other | Admitting: Family Medicine

## 2013-05-06 ENCOUNTER — Encounter: Payer: Self-pay | Admitting: Family Medicine

## 2013-05-06 VITALS — BP 140/72 | HR 78 | Temp 98.1°F | Resp 18 | Wt 229.0 lb

## 2013-05-06 DIAGNOSIS — E782 Mixed hyperlipidemia: Secondary | ICD-10-CM

## 2013-05-06 DIAGNOSIS — I10 Essential (primary) hypertension: Secondary | ICD-10-CM

## 2013-05-06 DIAGNOSIS — E039 Hypothyroidism, unspecified: Secondary | ICD-10-CM

## 2013-05-06 DIAGNOSIS — E119 Type 2 diabetes mellitus without complications: Secondary | ICD-10-CM

## 2013-05-06 MED ORDER — LEVOTHYROXINE SODIUM 75 MCG PO TABS: 75.0000 ug | ORAL_TABLET | Freq: Every day | ORAL | Status: DC

## 2013-05-06 NOTE — Progress Notes (Signed)
Subjective:    Patient ID: Whitney Morales, female    DOB: [birth year], 72 y.o.   MRN: [id number]  HPI  Patient is here today for followup of her hypertension, hyperlipidemia, diabetes mellitus type 2, and hypothyroidism. Her most recent labs are listed below.  Is significant for hemoglobin A1c of 7.3, TSH and is subtherapeutic, and a normal cholesterol panel  she states that her blood pressures are 130 to 140 over 70s at home.  She denies chest pain, shortness of breath, or dyspnea on exertion. She does report profound fatigue. 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   Lab on 05/01/2013  Component Date Value Range Status  . WBC 05/01/2013 7.5  4.0 - 10.5 K/uL Final  . RBC 05/01/2013 4.21  3.87 - 5.11 MIL/uL Final  . Hemoglobin 05/01/2013 12.3  12.0 - 15.0 g/dL Final  . HCT 40/98/1191 37.0  36.0 - 46.0 % Final  . MCV 05/01/2013 87.9  78.0 - 100.0 fL Final  . MCH 05/01/2013 29.2  26.0 - 34.0 pg Final  . MCHC 05/01/2013 33.2  30.0 - 36.0 g/dL Final  . RDW 47/82/9562 13.2  11.5 - 15.5 % Final  . Platelets 05/01/2013 302  150 - 400 K/uL Final  . Neutrophils Relative % 05/01/2013 61  43 - 77 % Final  . Neutro Abs 05/01/2013 4.6  1.7 - 7.7 K/uL Final  . Lymphocytes Relative 05/01/2013 28  12 - 46 % Final  . Lymphs Abs 05/01/2013 2.1  0.7 - 4.0 K/uL Final  . Monocytes Relative 05/01/2013 8  3 - 12 % Final  . Monocytes Absolute 05/01/2013 0.6  0.1 - 1.0 K/uL Final  . Eosinophils Relative 05/01/2013 2  0 - 5 % Final  . Eosinophils Absolute 05/01/2013 0.2  0.0 - 0.7 K/uL Final  . Basophils Relative 05/01/2013 1  0 - 1 % Final  . Basophils Absolute 05/01/2013 0.1  0.0 - 0.1 K/uL Final  . Smear Review 05/01/2013 Criteria for review not met   Final  . Sodium 05/01/2013 143  135 - 145 mEq/L Final  . Potassium 05/01/2013 4.3  3.5 - 5.3 mEq/L Final  . Chloride 05/01/2013 102  96 - 112 mEq/L Final  . CO2 05/01/2013 29  19 - 32 mEq/L Final  . Glucose, Bld 05/01/2013 175* 70  - 99 mg/dL Final  . BUN 13/05/6577 17  6 - 23 mg/dL Final  . Creat 46/96/2952 1.08  0.50 - 1.10 mg/dL Final  . Total Bilirubin 05/01/2013 0.3  0.3 - 1.2 mg/dL Final  . Alkaline Phosphatase 05/01/2013 50  39 - 117 U/L Final  . AST 05/01/2013 14  0 - 37 U/L Final  . ALT 05/01/2013 16  0 - 35 U/L Final  . Total Protein 05/01/2013 6.9  6.0 - 8.3 g/dL Final  . Albumin 84/13/2440 4.3  3.5 - 5.2 g/dL Final  . Calcium 08/19/2535 9.9  8.4 - 10.5 mg/dL Final  . Hemoglobin U4Q 05/01/2013 7.3* <5.7 % Final   Comment:  According to the ADA Clinical Practice Recommendations for 2011, when                          HbA1c is used as a screening test:                                                       >=6.5%   Diagnostic of Diabetes Mellitus                                     (if abnormal result is confirmed)                                                     5.7-6.4%   Increased risk of developing Diabetes Mellitus                                                     References:Diagnosis and Classification of Diabetes Mellitus,Diabetes                          Care,2011,34(Suppl 1):S62-S69 and Standards of Medical Care in                                  Diabetes - 2011,Diabetes Care,2011,34 (Suppl 1):S11-S61.                             . Mean Plasma Glucose 05/01/2013 163* <117 mg/dL Final  . Cholesterol 16/07/9603 154  0 - 200 mg/dL Final   Comment: ATP III Classification:                                < 200        mg/dL        Desirable                               200 - 239     mg/dL        Borderline High                               >= 240        mg/dL        High                             . Triglycerides 05/01/2013 129  <150 mg/dL Final  . HDL 54/06/8118 40  >39 mg/dL Final  . Total CHOL/HDL Ratio 05/01/2013 3.9   Final  . VLDL 05/01/2013 26  0 - 40 mg/dL Final  .  LDL Cholesterol  05/01/2013 88  0 - 99 mg/dL Final   Comment:                            Total Cholesterol/HDL Ratio:CHD Risk                                                 Coronary Heart Disease Risk Table                                                                 Men       Women                                   1/2 Average Risk              3.4        3.3                                       Average Risk              5.0        4.4                                    2X Average Risk              9.6        7.1                                    3X Average Risk             23.4       11.0                          Use the calculated Patient Ratio above and the CHD Risk table                           to determine the patient's CHD Risk.                          ATP III Classification (LDL):                                < 100        mg/dL         Optimal                               100 - 129     mg/dL  Near or Above Optimal                               130 - 159     mg/dL         Borderline High                               160 - 189     mg/dL         High                                > 190        mg/dL         Very High                             . TSH 05/01/2013 5.242* 0.350 - 4.500 uIU/mL Final    Past Medical History  Diagnosis Date  . Diverticulitis   . Diabetes mellitus   . Hypertension   . High cholesterol   . Fatigue   . Obesity   . H/O hiatal hernia   . Goiter     cyst vs goiter on thyroid  . GERD (gastroesophageal reflux disease)     PAST HX--NO PROBLEMS NOW-PT DOES TAKE DAILY TUMS AT BEDTIME  . Abnormal finding on EKG 10/29/2012    FOLLOW UP NUCLEAR STRESS TEST ON 11/05/12 -NORMAL  . Corneal erosion 2012    RIGHT EYE--HAS RESOLVED--NO PROBLEMS SINCE   Past Surgical History  Procedure Laterality Date  . Abdominal hysterectomy    . Tonsillectomy    . US echocardiography  03/23/2006    EF 55-60%  . Cardiovascular stress test  06/08/2009    EF 78%, NO ISCHEMIA  .  Breast lumpectomy      BENIGN  . Thyroid lobectomy  11/27/2012    Procedure: THYROID LOBECTOMY;  Surgeon: Axel Filler, MD;  Location: [facility];  Service: General;  Laterality: Right;  Right Thyroid Lobectomy   Current Outpatient Prescriptions on File Prior to Visit  Medication Sig Dispense Refill  . aspirin 81 MG tablet Take 81 mg by mouth daily.       . Calcium Carbonate Antacid (TUMS PO) Take by mouth at bedtime. RARELY WOULD NEED ADDITIONAL TUMS DURING DAY IF SHE ATE SPICY FOODS      . eszopiclone (LUNESTA) 1 MG TABS Take 1 mg by mouth as needed. Take immediately before bedtime as needed, patient takes mainly when traveling      . fluticasone (FLONASE) 50 MCG/ACT nasal spray Place 2 sprays into the nose as needed.       Marland Kitchen glipiZIDE (GLUCOTROL) 5 MG tablet Take 1 tablet (5 mg total) by mouth 2 (two) times daily before a meal.  180 tablet  1  . hyoscyamine (LEVSIN/SL) 0.125 MG SL tablet Place 1 tablet (0.125 mg total) under the tongue every 4 (four) hours as needed for cramping.  30 tablet  0  . lisinopril-hydrochlorothiazide (PRINZIDE,ZESTORETIC) 20-25 MG per tablet Take 1 tablet by mouth daily.  30 tablet  3  . loratadine (CLARITIN) 10 MG tablet Take 10 mg by mouth daily.      . metFORMIN (GLUCOPHAGE) 500 MG tablet Take 2 tablets (1,000 mg total) by mouth 2 (two) times daily  with a meal.  120 tablet  3  . Multiple Vitamin (MULTIVITAMIN WITH MINERALS) TABS Take 1 tablet by mouth daily.      . simvastatin (ZOCOR) 80 MG tablet Take 40 mg by mouth at bedtime. Takes 1/2 tablet       No current facility-administered medications on file prior to visit.   Allergies  Allergen Reactions  . Clindamycin/Lincomycin     CLINDAMYCIN TAKEN WITH CIPRO CAUSED SEVERE NAUSEA--PT STATES SHE CAN TAKE CIPRO ALONE WITHOUT PROBLEM--IT WAS JUST THE COMBINATION SHE DID NOT TOLERATE.  Marland Kitchen Levaquin (Levofloxacin) Nausea And Vomiting  . Metronidazole Hcl Other (See Comments)    Deathly sick WHEN PT TOOK METRONIDAZOLE  WITH CIPRO.  PT STATES SHE HAS TAKEN CIPRO ALONE WITHOUT PROBLEM --JUST DID NOT TOLERATE THE COMBINATION OF BOTH DRUGS TAKEN TOGETHER.   History   Social History  . Marital Status: Married    Spouse Name: N/A    Number of Children: N/A  . Years of Education: N/A   Occupational History  . Not on file.   Social History Main Topics  . Smoking status: Never Smoker   . Smokeless tobacco: Not on file  . Alcohol Use: No  . Drug Use: No  . Sexually Active:    Other Topics Concern  . Not on file   Social History Narrative  . No narrative on file     Review of Systems  All other systems reviewed and are negative.       Objective:   Physical Exam  Vitals reviewed. Constitutional: She is oriented to person, place, and time.  Neck: Neck supple. No JVD present. No thyromegaly present.  Cardiovascular: Normal rate, regular rhythm, normal heart sounds and intact distal pulses.   No murmur heard. Pulmonary/Chest: Effort normal and breath sounds normal. No respiratory distress. She has no wheezes. She has no rales. She exhibits no tenderness.  Abdominal: Soft. Bowel sounds are normal. She exhibits no distension and no mass. There is no tenderness. There is no rebound and no guarding.  Lymphadenopathy:    She has no cervical adenopathy.  Neurological: She is alert and oriented to person, place, and time. She has normal reflexes. She displays normal reflexes. No cranial nerve deficit. She exhibits normal muscle tone. Coordination normal.  Skin: Skin is warm. No rash noted. No erythema. No pallor.   Diabetic foot exam is performed.       Assessment & Plan:  1. Mixed hyperlipidemia Cholesterol is well controlled. Continue simvastatin at present dose  2. Unspecified hypothyroidism Increase levothyroxine to 75 mcg by mouth daily and recheck TSH in 2 months the  3. Type II or unspecified type diabetes mellitus without mention of complication, not stated as uncontrolled Strongly  recommended we discontinue glipizide and replace it with invokana 300 mg poqday.  Patient would like to defer this for now and recheck it again in 3 months after making dietary changes.  She has recently had her eye exam.  4. HTN (hypertension) Patient is instructed to check her blood pressure daily and reports values to me in 2 weeks. Draw pressure is less than 130/80.  If higher I will increase lisinopril to 40mg .

## 2013-05-09 ENCOUNTER — Encounter: Payer: Self-pay | Admitting: *Deleted

## 2013-05-09 ENCOUNTER — Encounter: Payer: Self-pay | Admitting: Family Medicine

## 2013-05-15 ENCOUNTER — Telehealth: Payer: Self-pay | Admitting: Family Medicine

## 2013-05-16 ENCOUNTER — Other Ambulatory Visit: Payer: Self-pay | Admitting: Family Medicine

## 2013-05-16 NOTE — Telephone Encounter (Signed)
Medication refilled per protocol. 

## 2013-05-18 NOTE — Telephone Encounter (Signed)
Completed.

## 2013-05-22 ENCOUNTER — Other Ambulatory Visit: Payer: Self-pay | Admitting: Family Medicine

## 2013-05-22 NOTE — Telephone Encounter (Signed)
Med refilled.

## 2013-05-27 ENCOUNTER — Telehealth: Payer: Self-pay | Admitting: Family Medicine

## 2013-05-27 NOTE — Telephone Encounter (Signed)
Pt C/O periodic dizziness.  Can not relate it to doing any one thing.  Is getting worse.  NTBS.  appt made for tomorrow morning.

## 2013-05-28 ENCOUNTER — Other Ambulatory Visit: Payer: Self-pay

## 2013-05-28 ENCOUNTER — Ambulatory Visit (INDEPENDENT_AMBULATORY_CARE_PROVIDER_SITE_OTHER): Payer: Medicare Other | Admitting: Family Medicine

## 2013-05-28 ENCOUNTER — Encounter: Payer: Self-pay | Admitting: Family Medicine

## 2013-05-28 VITALS — BP 122/74 | HR 92 | Temp 98.2°F | Resp 18 | Wt 225.0 lb

## 2013-05-28 DIAGNOSIS — H8110 Benign paroxysmal vertigo, unspecified ear: Secondary | ICD-10-CM

## 2013-05-28 MED ORDER — MECLIZINE HCL 25 MG PO TABS: 25.0000 mg | ORAL_TABLET | Freq: Three times a day (TID) | ORAL | Status: DC | PRN

## 2013-05-28 NOTE — Progress Notes (Signed)
Subjective:    Patient ID: Whitney Morales, female    DOB: 11/12/1940, 72 y.o.   MRN: [id number]  HPI Patient reports 3 days of vertigo with position changes. She becomes extremely dizzy when she rolls over in bed or sits up in bed. She becomes extremely dizzy with standing. She denies any hearing loss or headaches. She denies any ear pain. She denies any symptoms of stroke. Her cerebellar exam is normal. She denies any fevers. Past Medical History  Diagnosis Date  . Diverticulitis   . Diabetes mellitus   . Hypertension   . High cholesterol   . Fatigue   . Obesity   . H/O hiatal hernia   . Goiter     cyst vs goiter on thyroid  . GERD (gastroesophageal reflux disease)     PAST HX--NO PROBLEMS NOW-PT DOES TAKE DAILY TUMS AT BEDTIME  . Abnormal finding on EKG 10/29/2012    FOLLOW UP NUCLEAR STRESS TEST ON 11/05/12 -NORMAL  . 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 aspirin 81 MG tablet Take 81 mg by mouth daily.       . Calcium Carbonate Antacid (TUMS PO) Take by mouth at bedtime. RARELY WOULD NEED ADDITIONAL TUMS DURING DAY IF SHE ATE SPICY FOODS      . clotrimazole-betamethasone (LOTRISONE) cream APPLY TWICE DAILY FOR 10 DAYS  30 g  1  . eszopiclone (LUNESTA) 1 MG TABS Take 1 mg by mouth as needed. Take immediately before bedtime as needed, patient takes mainly when traveling      . fluticasone (FLONASE) 50 MCG/ACT nasal spray Place 2 sprays into the nose as needed.       Marland Kitchen glipiZIDE (GLUCOTROL) 5 MG tablet Take 1 tablet (5 mg total) by mouth 2 (two) times daily before a meal.  180 tablet  1  . hyoscyamine (LEVSIN/SL) 0.125 MG SL tablet Place 1 tablet (0.125 mg total) under the tongue every 4 (four) hours as needed for cramping.  30 tablet  0  . levothyroxine (SYNTHROID, LEVOTHROID) 75 MCG tablet Take 1 tablet (75 mcg total) by mouth daily before breakfast.  30 tablet   1  . lisinopril-hydrochlorothiazide (PRINZIDE,ZESTORETIC) 20-25 MG per tablet Take 1 tablet by mouth daily.  30 tablet  3  . loratadine (CLARITIN) 10 MG tablet Take 10 mg by mouth daily.      . metFORMIN (GLUCOPHAGE) 500 MG tablet TAKE 2 TABLETS (1,000 MG TOTAL) BY MOUTH 2 (TWO) TIMES DAILY WITH A MEAL.  360 tablet  2  . Multiple Vitamin (MULTIVITAMIN WITH MINERALS) TABS Take 1 tablet by mouth daily.      . simvastatin (ZOCOR) 80 MG tablet Take 40 mg by mouth at bedtime. Takes 1/2 tablet       No current facility-administered medications on file prior to visit.   Allergies  Allergen Reactions  . Clindamycin/Lincomycin     CLINDAMYCIN TAKEN WITH CIPRO CAUSED SEVERE NAUSEA--PT STATES SHE CAN TAKE CIPRO ALONE WITHOUT PROBLEM--IT WAS JUST THE COMBINATION SHE DID NOT TOLERATE.  Marland Kitchen Levaquin (Levofloxacin) Nausea And Vomiting  . Metronidazole Hcl Other (See Comments)    Deathly sick WHEN PT TOOK METRONIDAZOLE WITH CIPRO.  PT STATES SHE HAS TAKEN CIPRO ALONE WITHOUT PROBLEM --JUST DID NOT TOLERATE THE COMBINATION OF BOTH DRUGS TAKEN TOGETHER.   History   Social History  . Marital Status: Married    Spouse  Name: N/A    Number of Children: N/A  . Years of Education: N/A   Occupational History  . Not on file.   Social History Main Topics  . Smoking status: Never Smoker   . Smokeless tobacco: Not on file  . Alcohol Use: No  . Drug Use: No  . Sexually Active:    Other Topics Concern  . Not on file   Social History Narrative  . No narrative on file      Review of Systems  All other systems reviewed and are negative.       Objective:   Physical Exam  Constitutional: She is oriented to person, place, and time. She appears well-developed and well-nourished.  HENT:  Right Ear: External ear normal.  Left Ear: External ear normal.  Mouth/Throat: Oropharynx is clear and moist. No oropharyngeal exudate.  Eyes: Conjunctivae and EOM are normal. Pupils are equal, round, and reactive to  light.  Neck: Neck supple. No thyromegaly present.  Cardiovascular: Normal rate, regular rhythm and normal heart sounds.   No murmur heard. Pulmonary/Chest: Effort normal and breath sounds normal. No respiratory distress. She has no wheezes. She has no rales.  Lymphadenopathy:    She has no cervical adenopathy.  Neurological: She is alert and oriented to person, place, and time. She has normal reflexes. She displays normal reflexes. No cranial nerve deficit. She exhibits normal muscle tone. Coordination normal.   Dix-Hallpike maneuver is positive to the right       Assessment & Plan:  1. Benign paroxysmal positional vertigo Spent 15 minutes discussing the natural history and etiology of vertigo. Recommended meclizine 25 mg by mouth every 8 hours when necessary vertigo. Recommended tincture of time for one to 2 weeks. If symptoms persist consider Epley maneuvers. If symptoms change consider MRI of the brain. - meclizine (ANTIVERT) 25 MG tablet; Take 1 tablet (25 mg total) by mouth 3 (three) times daily as needed for dizziness.  Dispense: 30 tablet; Refill: 0

## 2013-07-09 ENCOUNTER — Other Ambulatory Visit: Payer: Medicare Other

## 2013-07-09 DIAGNOSIS — E039 Hypothyroidism, unspecified: Secondary | ICD-10-CM

## 2013-07-09 LAB — TSH: TSH: 4.181 u[IU]/mL (ref 0.350–4.500)

## 2013-07-11 ENCOUNTER — Other Ambulatory Visit: Payer: Self-pay | Admitting: Family Medicine

## 2013-07-18 ENCOUNTER — Other Ambulatory Visit: Payer: Self-pay | Admitting: Family Medicine

## 2013-07-21 ENCOUNTER — Other Ambulatory Visit: Payer: Self-pay | Admitting: Family Medicine

## 2013-07-22 ENCOUNTER — Other Ambulatory Visit: Payer: Self-pay | Admitting: Family Medicine

## 2013-07-23 ENCOUNTER — Other Ambulatory Visit: Payer: Self-pay | Admitting: Family Medicine

## 2013-07-23 ENCOUNTER — Encounter: Payer: Self-pay | Admitting: Family Medicine

## 2013-07-24 ENCOUNTER — Telehealth: Payer: Self-pay | Admitting: Family Medicine

## 2013-07-24 MED ORDER — LISINOPRIL-HYDROCHLOROTHIAZIDE 20-25 MG PO TABS: 1.0000 | ORAL_TABLET | Freq: Every day | ORAL | Status: DC

## 2013-07-24 NOTE — Telephone Encounter (Signed)
Rx Refilled  

## 2013-07-24 NOTE — Telephone Encounter (Signed)
Lisinopril/HCTZ 20-25 mg 1 QD #90

## 2013-07-26 ENCOUNTER — Other Ambulatory Visit: Payer: Self-pay | Admitting: Family Medicine

## 2013-08-28 ENCOUNTER — Other Ambulatory Visit: Payer: Self-pay

## 2013-09-09 ENCOUNTER — Other Ambulatory Visit: Payer: Medicare Other

## 2013-09-09 DIAGNOSIS — Z79899 Other long term (current) drug therapy: Secondary | ICD-10-CM

## 2013-09-09 DIAGNOSIS — E785 Hyperlipidemia, unspecified: Secondary | ICD-10-CM

## 2013-09-09 DIAGNOSIS — E119 Type 2 diabetes mellitus without complications: Secondary | ICD-10-CM

## 2013-09-09 DIAGNOSIS — I10 Essential (primary) hypertension: Secondary | ICD-10-CM

## 2013-09-09 LAB — LIPID PANEL
Cholesterol: 150 mg/dL (ref 0–200)
HDL: 33 mg/dL — ABNORMAL LOW (ref >39)
LDL Cholesterol: 89 mg/dL (ref 0–99)
Total CHOL/HDL Ratio: 4.5 Ratio
Triglycerides: 139 mg/dL (ref <150)
VLDL: 28 mg/dL (ref 0–40)

## 2013-09-09 LAB — COMPREHENSIVE METABOLIC PANEL
ALT: 17 U/L (ref 0–35)
AST: 16 U/L (ref 0–37)
Albumin: 4.2 g/dL (ref 3.5–5.2)
Alkaline Phosphatase: 55 U/L (ref 39–117)
BUN: 13 mg/dL (ref 6–23)
CO2: 30 mEq/L (ref 19–32)
Calcium: 9.8 mg/dL (ref 8.4–10.5)
Chloride: 101 mEq/L (ref 96–112)
Creat: 1.02 mg/dL (ref 0.50–1.10)
Glucose, Bld: 157 mg/dL — ABNORMAL HIGH (ref 70–99)
Potassium: 4.4 mEq/L (ref 3.5–5.3)
Sodium: 142 mEq/L (ref 135–145)
Total Bilirubin: 0.3 mg/dL (ref 0.3–1.2)
Total Protein: 6.9 g/dL (ref 6.0–8.3)

## 2013-09-09 LAB — CBC WITH DIFFERENTIAL/PLATELET
Basophils Absolute: 0.1 10*3/uL (ref 0.0–0.1)
Basophils Relative: 1 % (ref 0–1)
Eosinophils Absolute: 0.2 10*3/uL (ref 0.0–0.7)
Eosinophils Relative: 2 % (ref 0–5)
HCT: 36.9 % (ref 36.0–46.0)
Hemoglobin: 12.6 g/dL (ref 12.0–15.0)
Lymphocytes Relative: 30 % (ref 12–46)
Lymphs Abs: 2.4 10*3/uL (ref 0.7–4.0)
MCH: 31.2 pg (ref 26.0–34.0)
MCHC: 34.1 g/dL (ref 30.0–36.0)
MCV: 91.3 fL (ref 78.0–100.0)
Monocytes Absolute: 0.8 10*3/uL (ref 0.1–1.0)
Monocytes Relative: 9 % (ref 3–12)
Neutro Abs: 4.7 10*3/uL (ref 1.7–7.7)
Neutrophils Relative %: 58 % (ref 43–77)
Platelets: 299 10*3/uL (ref 150–400)
RBC: 4.04 MIL/uL (ref 3.87–5.11)
RDW: 13.3 % (ref 11.5–15.5)
WBC: 8.1 10*3/uL (ref 4.0–10.5)

## 2013-09-09 LAB — HEMOGLOBIN A1C
Hgb A1c MFr Bld: 7.5 % — ABNORMAL HIGH (ref <5.7)
Mean Plasma Glucose: 169 mg/dL — ABNORMAL HIGH (ref <117)

## 2013-09-15 ENCOUNTER — Encounter: Payer: Self-pay | Admitting: Family Medicine

## 2013-09-15 ENCOUNTER — Ambulatory Visit (INDEPENDENT_AMBULATORY_CARE_PROVIDER_SITE_OTHER): Payer: Medicare Other | Admitting: Family Medicine

## 2013-09-15 VITALS — BP 130/74 | HR 82 | Temp 97.4°F | Resp 18 | Ht 63.0 in | Wt 231.0 lb

## 2013-09-15 DIAGNOSIS — E119 Type 2 diabetes mellitus without complications: Secondary | ICD-10-CM

## 2013-09-15 DIAGNOSIS — Z23 Encounter for immunization: Secondary | ICD-10-CM

## 2013-09-15 MED ORDER — LEVOTHYROXINE SODIUM 75 MCG PO TABS: ORAL_TABLET | ORAL | Status: DC

## 2013-09-15 NOTE — Progress Notes (Signed)
Subjective:    Patient ID: Whitney Morales, female    DOB: 10-May-1941, 72 y.o.   MRN: [id number]  HPI Patient presents today for followup of her multiple medical problems. She is also due for Prevnar 13. The patient has a history of hypertension which is well-controlled, hyperlipidemia which is also well controlled and diabetes mellitus type 2 which is not controlled. I reviewed her medication list in detail. Her most recent lab work is listed below: Lab on 09/09/2013  Component Date Value Range Status  . WBC 09/09/2013 8.1  4.0 - 10.5 K/uL Final  . RBC 09/09/2013 4.04  3.87 - 5.11 MIL/uL Final  . Hemoglobin 09/09/2013 12.6  12.0 - 15.0 g/dL Final  . HCT 82/95/6213 36.9  36.0 - 46.0 % Final  . MCV 09/09/2013 91.3  78.0 - 100.0 fL Final  . MCH 09/09/2013 31.2  26.0 - 34.0 pg Final  . MCHC 09/09/2013 34.1  30.0 - 36.0 g/dL Final  . RDW 08/65/7846 13.3  11.5 - 15.5 % Final  . Platelets 09/09/2013 299  150 - 400 K/uL Final  . Neutrophils Relative % 09/09/2013 58  43 - 77 % Final  . Neutro Abs 09/09/2013 4.7  1.7 - 7.7 K/uL Final  . Lymphocytes Relative 09/09/2013 30  12 - 46 % Final  . Lymphs Abs 09/09/2013 2.4  0.7 - 4.0 K/uL Final  . Monocytes Relative 09/09/2013 9  3 - 12 % Final  . Monocytes Absolute 09/09/2013 0.8  0.1 - 1.0 K/uL Final  . Eosinophils Relative 09/09/2013 2  0 - 5 % Final  . Eosinophils Absolute 09/09/2013 0.2  0.0 - 0.7 K/uL Final  . Basophils Relative 09/09/2013 1  0 - 1 % Final  . Basophils Absolute 09/09/2013 0.1  0.0 - 0.1 K/uL Final  . Smear Review 09/09/2013 Criteria for review not met   Final  . Sodium 09/09/2013 142  135 - 145 mEq/L Final  . Potassium 09/09/2013 4.4  3.5 - 5.3 mEq/L Final  . Chloride 09/09/2013 101  96 - 112 mEq/L Final  . CO2 09/09/2013 30  19 - 32 mEq/L Final  . Glucose, Bld 09/09/2013 157* 70 - 99 mg/dL Final  . BUN 96/29/5284 13  6 - 23 mg/dL Final  . Creat 13/24/4010 1.02  0.50 - 1.10 mg/dL Final  . Total Bilirubin 09/09/2013 0.3  0.3  - 1.2 mg/dL Final  . Alkaline Phosphatase 09/09/2013 55  39 - 117 U/L Final  . AST 09/09/2013 16  0 - 37 U/L Final  . ALT 09/09/2013 17  0 - 35 U/L Final  . Total Protein 09/09/2013 6.9  6.0 - 8.3 g/dL Final  . Albumin 27/25/3664 4.2  3.5 - 5.2 g/dL Final  . Calcium 40/34/7425 9.8  8.4 - 10.5 mg/dL Final  . Cholesterol 95/63/8756 150  0 - 200 mg/dL Final   Comment: ATP III Classification:                                < 200        mg/dL        Desirable                               200 - 239     mg/dL        Borderline High                               >=  240        mg/dL        High                             . Triglycerides 09/09/2013 139  <150 mg/dL Final  . HDL 16/07/9603 33* >39 mg/dL Final  . Total CHOL/HDL Ratio 09/09/2013 4.5   Final  . VLDL 09/09/2013 28  0 - 40 mg/dL Final  . LDL Cholesterol 09/09/2013 89  0 - 99 mg/dL Final   Comment:                            Total Cholesterol/HDL Ratio:CHD Risk                                                 Coronary Heart Disease Risk Table                                                                 Men       Women                                   1/2 Average Risk              3.4        3.3                                       Average Risk              5.0        4.4                                    2X Average Risk              9.6        7.1                                    3X Average Risk             23.4       11.0                          Use the calculated Patient Ratio above and the CHD Risk table                           to determine the patient's CHD Risk.                          ATP III Classification (LDL):                                <  100        mg/dL         Optimal                               100 - 129     mg/dL         Near or Above Optimal                               130 - 159     mg/dL         Borderline High                               160 - 189     mg/dL         High                                 > 190        mg/dL         Very High                             . Hemoglobin A1C 09/09/2013 7.5* <5.7 % Final   Comment:                                                                                                 According to the ADA Clinical Practice Recommendations for 2011, when                          HbA1c is used as a screening test:                                                       >=6.5%   Diagnostic of Diabetes Mellitus                                     (if abnormal result is confirmed)                                                     5.7-6.4%   Increased risk of developing Diabetes Mellitus  References:Diagnosis and Classification of Diabetes Mellitus,Diabetes                          Care,2011,34(Suppl 1):S62-S69 and Standards of Medical Care in                                  Diabetes - 2011,Diabetes Care,2011,34 (Suppl 1):S11-S61.                             . Mean Plasma Glucose 09/09/2013 169* <117 mg/dL Final   the remainder of her 12 point review of systems is negative aside from fatigue and some mild androgenic alopecia. Past Medical History  Diagnosis Date  . Diverticulitis   . Diabetes mellitus   . Hypertension   . High cholesterol   . Fatigue   . Obesity   . H/O hiatal hernia   . Goiter     cyst vs goiter on thyroid  . GERD (gastroesophageal reflux disease)     PAST HX--NO PROBLEMS NOW-PT DOES TAKE DAILY TUMS AT BEDTIME  . Abnormal finding on EKG 10/29/2012    FOLLOW UP NUCLEAR STRESS TEST ON 11/05/12 -NORMAL  . Corneal erosion 2012    RIGHT EYE--HAS RESOLVED--NO PROBLEMS SINCE   Past Surgical History  Procedure Laterality Date  . Abdominal hysterectomy    . Tonsillectomy    . US echocardiography  03/23/2006    EF 55-60%  . Cardiovascular stress test  06/08/2009    EF 78%, NO ISCHEMIA  . Breast lumpectomy      BENIGN  . Thyroid lobectomy  11/27/2012    Procedure: THYROID LOBECTOMY;   Surgeon: Axel Filler, MD;  Location: [facility];  Service: General;  Laterality: Right;  Right Thyroid Lobectomy   Current Outpatient Prescriptions on File Prior to Visit  Medication Sig Dispense Refill  . ACCU-CHEK ACTIVE STRIPS test strip USE AS DIRECTED  100 each  0  . aspirin 81 MG tablet Take 81 mg by mouth daily.       . Calcium Carbonate Antacid (TUMS PO) Take by mouth at bedtime. RARELY WOULD NEED ADDITIONAL TUMS DURING DAY IF SHE ATE SPICY FOODS      . clotrimazole-betamethasone (LOTRISONE) cream APPLY TWICE DAILY FOR 10 DAYS  30 g  1  . eszopiclone (LUNESTA) 1 MG TABS Take 1 mg by mouth as needed. Take immediately before bedtime as needed, patient takes mainly when traveling      . fluticasone (FLONASE) 50 MCG/ACT nasal spray Place 2 sprays into the nose as needed.       Marland Kitchen glipiZIDE (GLUCOTROL) 5 MG tablet Take 1 tablet (5 mg total) by mouth 2 (two) times daily before a meal.  180 tablet  1  . hyoscyamine (LEVSIN/SL) 0.125 MG SL tablet Place 1 tablet (0.125 mg total) under the tongue every 4 (four) hours as needed for cramping.  30 tablet  0  . lisinopril-hydrochlorothiazide (PRINZIDE,ZESTORETIC) 20-25 MG per tablet Take 1 tablet by mouth daily.  90 tablet  1  . loratadine (CLARITIN) 10 MG tablet TAKE 1 TABLET BY MOUTH DAILY  90 tablet  9  . meclizine (ANTIVERT) 25 MG tablet Take 1 tablet (25 mg total) by mouth 3 (three) times daily as needed for dizziness.  30 tablet  0  . metFORMIN (GLUCOPHAGE) 500 MG tablet TAKE 2 TABLETS (1,000 MG TOTAL)  BY MOUTH 2 (TWO) TIMES DAILY WITH A MEAL.  360 tablet  2  . Multiple Vitamin (MULTIVITAMIN WITH MINERALS) TABS Take 1 tablet by mouth daily.      . simvastatin (ZOCOR) 80 MG tablet Take 40 mg by mouth at bedtime. Takes 1/2 tablet       No current facility-administered medications on file prior to visit.   Allergies  Allergen Reactions  . Clindamycin/Lincomycin     CLINDAMYCIN TAKEN WITH CIPRO CAUSED SEVERE NAUSEA--PT STATES SHE CAN TAKE CIPRO  ALONE WITHOUT PROBLEM--IT WAS JUST THE COMBINATION SHE DID NOT TOLERATE.  Marland Kitchen Levaquin [Levofloxacin] Nausea And Vomiting  . Metronidazole Hcl Other (See Comments)    Deathly sick WHEN PT TOOK METRONIDAZOLE WITH CIPRO.  PT STATES SHE HAS TAKEN CIPRO ALONE WITHOUT PROBLEM --JUST DID NOT TOLERATE THE COMBINATION OF BOTH DRUGS TAKEN TOGETHER.   History   Social History  . Marital Status: Married    Spouse Name: N/A    Number of Children: N/A  . Years of Education: N/A   Occupational History  . Not on file.   Social History Main Topics  . Smoking status: Never Smoker   . Smokeless tobacco: Not on file  . Alcohol Use: No  . Drug Use: No  . Sexual Activity:    Other Topics Concern  . Not on file   Social History Narrative  . No narrative on file       Review of Systems  All other systems reviewed and are negative.       Objective:   Physical Exam  Vitals reviewed. Cardiovascular: Normal rate and regular rhythm.   Pulmonary/Chest: Effort normal and breath sounds normal.  Abdominal: Soft. Bowel sounds are normal.  Musculoskeletal: She exhibits no edema.          Assessment & Plan:  1. Type II or unspecified type diabetes mellitus without mention of complication, not stated as uncontrolled Add januvia 50 mg by mouth daily and recheck hemoglobin A1c in 3 months. I recommended Rogaine for Women for her hair loss. I also recommended increasing her levothyroxine to 88 mcg daily if her TSH continues to remain in the upper limits of normal in 3 months we'll recheck it again due to her fatigue. Otherwise her blood pressure and cholesterol are acceptable. The patient was given her Prevnar 13 today.

## 2013-09-15 NOTE — Addendum Note (Signed)
Addended by: Legrand Rams B on: 09/15/2013 09:30 AM   Modules accepted: Orders

## 2013-09-24 ENCOUNTER — Telehealth: Payer: Self-pay | Admitting: Family Medicine

## 2013-09-24 MED ORDER — ACCU-CHEK AVIVA PLUS W/DEVICE KIT: PACK | Status: DC

## 2013-09-24 NOTE — Telephone Encounter (Signed)
Prescription for meter (accu check avia plus black) she thinks its covered by medicare and she is wanting to know if that is the closest thing to what she has got  and then the test strips  Pharmacy [facility]  Call back number [phone]

## 2013-09-24 NOTE — Telephone Encounter (Signed)
Sent to pharm for new meter 

## 2013-10-02 ENCOUNTER — Other Ambulatory Visit: Payer: Self-pay | Admitting: Family Medicine

## 2013-10-02 MED ORDER — FLUTICASONE PROPIONATE 50 MCG/ACT NA SUSP: 2.0000 | NASAL | Status: DC | PRN

## 2013-10-02 NOTE — Telephone Encounter (Signed)
Rx Refilled  

## 2013-11-16 IMAGING — CR DG CHEST 2V
2 series · 2 of 2 positions shown · non-contrast
Comparison: None.

CLINICAL DATA: Cyst removed from back, diabetic

CHEST - 2 VIEW

[view not recorded (1 of 2)]
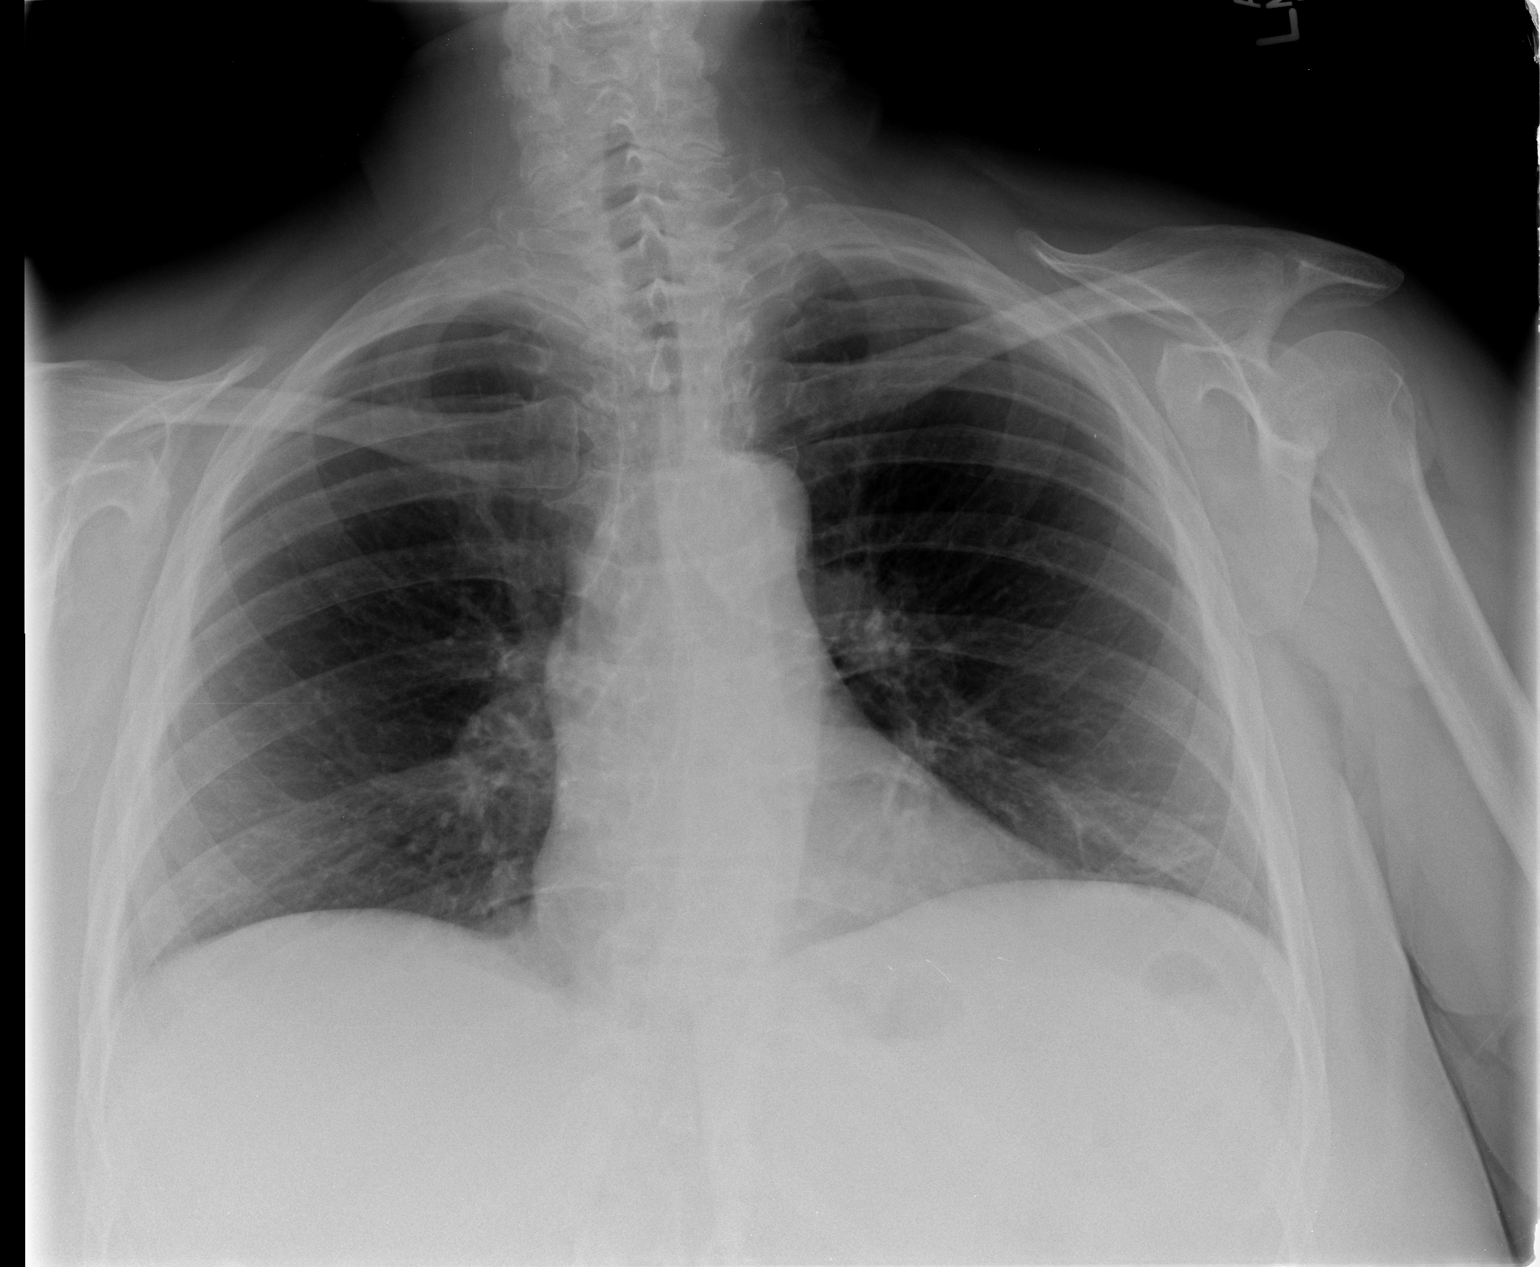

[view not recorded (2 of 2)]
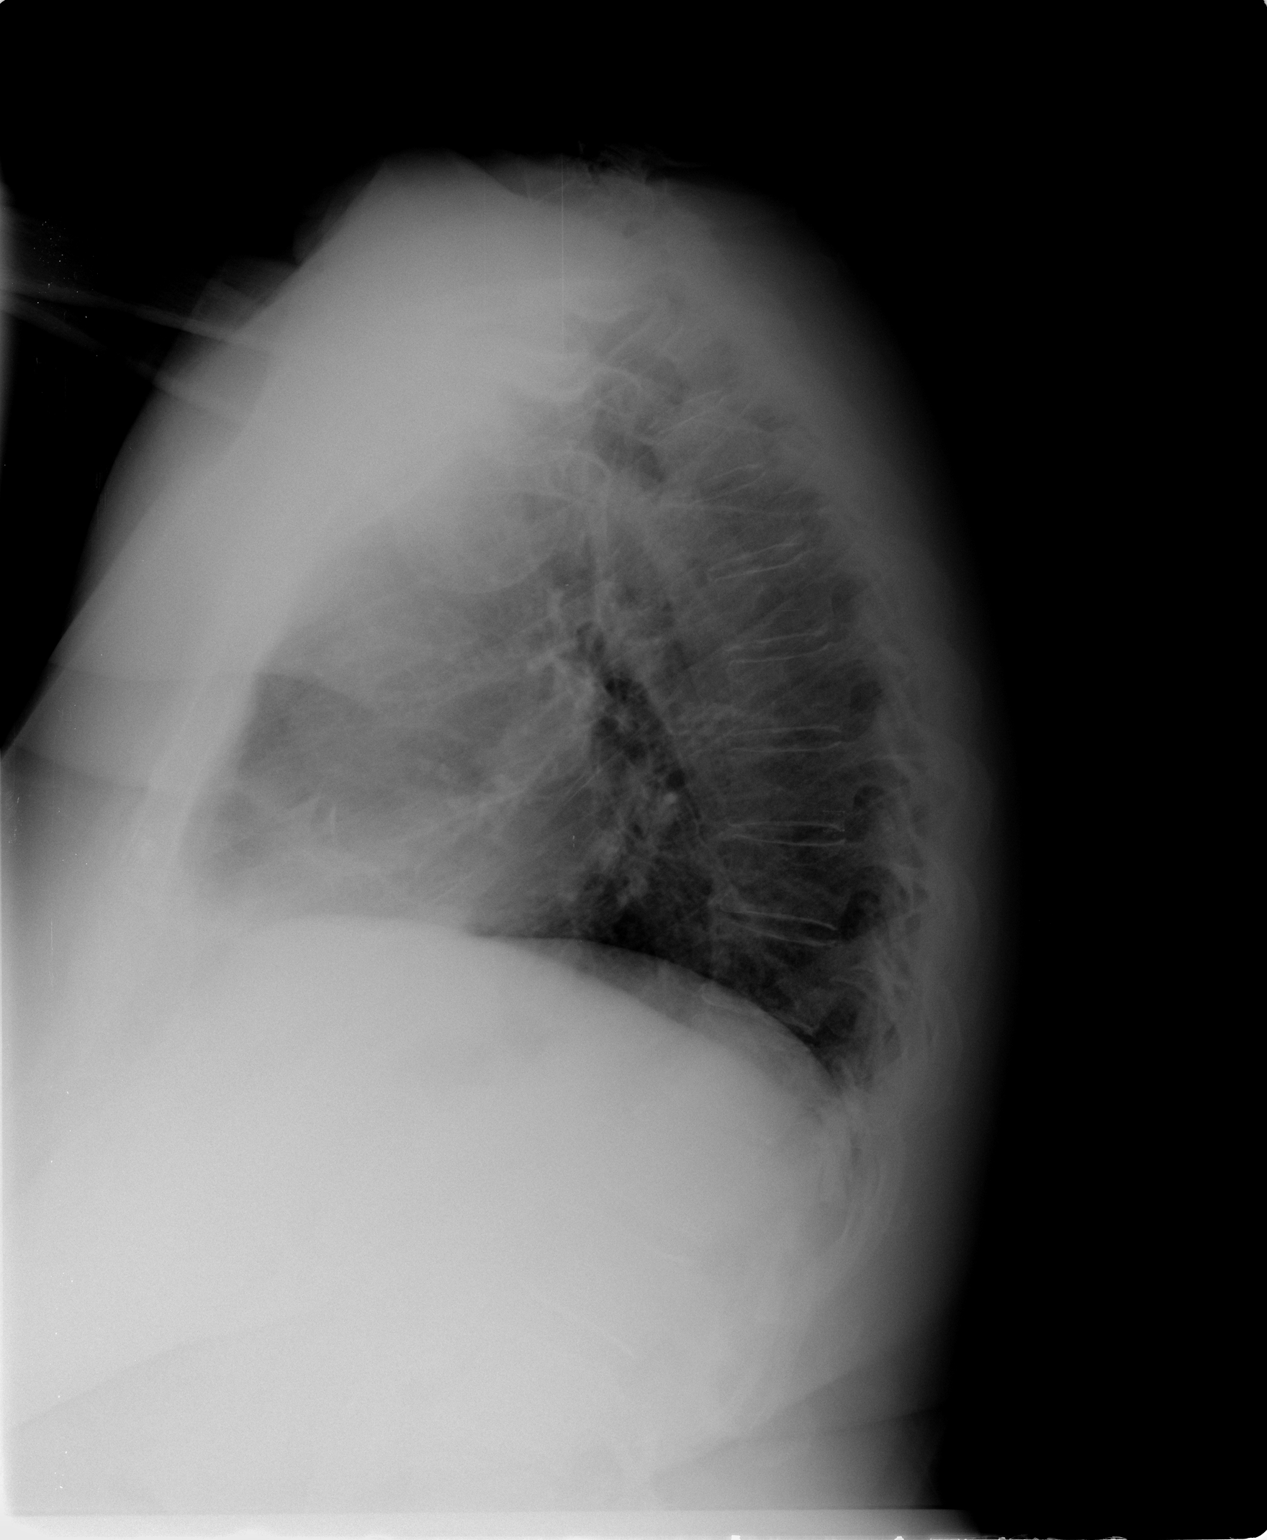

[2 of 2 positions shown; findings below may reference images not displayed]

FINDINGS: Lung volumes are low with crowding of the bronchovascular
markings.  Lung volumes are low.  No focal pulmonary opacity with
the exception of minimal bibasilar dependent atelectasis.  Heart
size is normal.  No pleural effusion.  No acute osseous finding.
IMPRESSION: No acute cardiopulmonary process.  Hypoaeration with minimal
bibasilar atelectasis.

## 2013-11-17 ENCOUNTER — Encounter: Payer: Self-pay | Admitting: Family Medicine

## 2013-11-17 ENCOUNTER — Ambulatory Visit (INDEPENDENT_AMBULATORY_CARE_PROVIDER_SITE_OTHER): Payer: Medicare Other | Admitting: Family Medicine

## 2013-11-17 VITALS — BP 120/64 | HR 78 | Temp 97.1°F | Resp 18 | Ht 63.0 in | Wt 230.0 lb

## 2013-11-17 DIAGNOSIS — J019 Acute sinusitis, unspecified: Secondary | ICD-10-CM

## 2013-11-17 MED ORDER — AMOXICILLIN 875 MG PO TABS: 875.0000 mg | ORAL_TABLET | Freq: Two times a day (BID) | ORAL | Status: DC

## 2013-11-17 NOTE — Progress Notes (Signed)
Subjective:    Patient ID: Whitney Morales, female    DOB: Jul 26, 1941, 73 y.o.   MRN: [id number]  HPI Patient reports one week of congestion, rhinorrhea, postnasal drip, cough, and sore throat. She denies any fever. She denies any chills. She denies any malaise or myalgias.  She is convinced she needs an antibiotic.  She denies any nausea vomiting or diarrhea. Past Medical History  Diagnosis Date  . Diverticulitis   . Diabetes mellitus   . Hypertension   . High cholesterol   . Fatigue   . Obesity   . H/O hiatal hernia   . Goiter     cyst vs goiter on thyroid  . GERD (gastroesophageal reflux disease)     PAST HX--NO PROBLEMS NOW-PT DOES TAKE DAILY TUMS AT BEDTIME  . Abnormal finding on EKG 10/29/2012    FOLLOW UP NUCLEAR STRESS TEST ON 11/05/12 -NORMAL  . 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 aspirin 81 MG tablet Take 81 mg by mouth daily.       . Blood Glucose Monitoring Suppl (ACCU-CHEK AVIVA PLUS) W/DEVICE KIT Pt needs meter-strips(1 bottle =100/5 refills)-lancets(1 box/5 refills)  Checks BS qd DX: 250.00  1 kit  0  . Calcium Carbonate Antacid (TUMS PO) Take by mouth at bedtime. RARELY WOULD NEED ADDITIONAL TUMS DURING DAY IF SHE ATE SPICY FOODS      . eszopiclone (LUNESTA) 1 MG TABS Take 1 mg by mouth as needed. Take immediately before bedtime as needed, patient takes mainly when traveling      . fluticasone (FLONASE) 50 MCG/ACT nasal spray Place 2 sprays into both nostrils as needed.  16 g  11  . glipiZIDE (GLUCOTROL) 5 MG tablet Take 1 tablet (5 mg total) by mouth 2 (two) times daily before a meal.  180 tablet  1  . levothyroxine (SYNTHROID, LEVOTHROID) 75 MCG tablet TAKE 1 TABLET (75 MCG TOTAL) BY MOUTH DAILY BEFORE BREAKFAST.  90 tablet  1  . lisinopril-hydrochlorothiazide (PRINZIDE,ZESTORETIC) 20-25 MG per tablet Take 1 tablet by mouth daily.  90 tablet  1   . loratadine (CLARITIN) 10 MG tablet TAKE 1 TABLET BY MOUTH DAILY  90 tablet  9  . metFORMIN (GLUCOPHAGE) 500 MG tablet TAKE 2 TABLETS (1,000 MG TOTAL) BY MOUTH 2 (TWO) TIMES DAILY WITH A MEAL.  360 tablet  2  . Multiple Vitamin (MULTIVITAMIN WITH MINERALS) TABS Take 1 tablet by mouth daily.      . simvastatin (ZOCOR) 80 MG tablet Take 40 mg by mouth at bedtime. Takes 1/2 tablet       No current facility-administered medications on file prior to visit.   Allergies  Allergen Reactions  . Clindamycin/Lincomycin     CLINDAMYCIN TAKEN WITH CIPRO CAUSED SEVERE NAUSEA--PT STATES SHE CAN TAKE CIPRO ALONE WITHOUT PROBLEM--IT WAS JUST THE COMBINATION SHE DID NOT TOLERATE.  Marland Kitchen Levaquin [Levofloxacin] Nausea And Vomiting  . Metronidazole Hcl Other (See Comments)    Deathly sick WHEN PT TOOK METRONIDAZOLE WITH CIPRO.  PT STATES SHE HAS TAKEN CIPRO ALONE WITHOUT PROBLEM --JUST DID NOT TOLERATE THE COMBINATION OF BOTH DRUGS TAKEN TOGETHER.   History   Social History  . Marital Status: Married    Spouse Name: N/A    Number of Children: N/A  . Years of Education: N/A   Occupational History  . Not on file.   Social History  Main Topics  . Smoking status: Never Smoker   . Smokeless tobacco: Not on file  . Alcohol Use: No  . Drug Use: No  . Sexual Activity:    Other Topics Concern  . Not on file   Social History Narrative  . No narrative on file      Review of Systems  All other systems reviewed and are negative.       Objective:   Physical Exam  Constitutional: She appears well-developed and well-nourished. No distress.  HENT:  Right Ear: External ear normal.  Left Ear: External ear normal.  Nose: Nose normal.  Mouth/Throat: Oropharynx is clear and moist. No oropharyngeal exudate.  Eyes: Conjunctivae are normal. Pupils are equal, round, and reactive to light. No scleral icterus.  Neck: Neck supple. No JVD present.  Cardiovascular: Normal rate, regular rhythm and normal heart  sounds.  Exam reveals no gallop and no friction rub.   No murmur heard. Pulmonary/Chest: Effort normal and breath sounds normal. No respiratory distress. She has no wheezes. She has no rales.  Lymphadenopathy:    She has no cervical adenopathy.  Skin: She is not diaphoretic.          Assessment & Plan:  1. Acute rhinosinusitis I explained to the patient I believe she has a viral upper respiratory tract infection. I explained that these typically takes 7-14 days from their course. Her recommended course the HBP for congestion, Chloraseptic for sore throat, and Mucinex DM as needed for cough. I recommended tincture of time. I did give the patient prescription for amoxicillin 875 mg by mouth twice a day for 10 days with explicit instructions not to fill the antibiotic unless symptoms proceed longer than 14 days or she develops a high fever greater than 101. - amoxicillin (AMOXIL) 875 MG tablet; Take 1 tablet (875 mg total) by mouth 2 (two) times daily.  Dispense: 20 tablet; Refill: 0

## 2013-12-16 ENCOUNTER — Telehealth: Payer: Self-pay | Admitting: *Deleted

## 2013-12-16 NOTE — Telephone Encounter (Signed)
Call placed to patient to make aware.   States that sore throat never fully went away. Advised to continue nasal decongestants due to nasal drainage in back of throat.   Advised that if no relief noted in 2 days to call back to schedule appointment to be seen.

## 2013-12-16 NOTE — Telephone Encounter (Signed)
Received call from patient.   Reported that she was seen in 11/17/2013 for sore throat. MD advised that he thought it was viral, but Rx for Augmentin 875/125mg  given. Advised not to fill unless she became worse.   Stated that sore throat faded for a while and she seemed to feel better, but now she has been sick with nasal drainage and sore throat x2 days. Requested to know if Rx should be filled at this time.

## 2013-12-16 NOTE — Telephone Encounter (Signed)
Given the length of time between the two incidences this is likely a different infection  I would not fill abx as symptoms sound viral.

## 2013-12-30 ENCOUNTER — Other Ambulatory Visit: Payer: Medicare Other

## 2013-12-30 ENCOUNTER — Other Ambulatory Visit: Payer: Self-pay | Admitting: Family Medicine

## 2013-12-30 DIAGNOSIS — E785 Hyperlipidemia, unspecified: Secondary | ICD-10-CM

## 2013-12-30 DIAGNOSIS — E119 Type 2 diabetes mellitus without complications: Secondary | ICD-10-CM

## 2013-12-30 DIAGNOSIS — Z79899 Other long term (current) drug therapy: Secondary | ICD-10-CM

## 2013-12-30 DIAGNOSIS — I10 Essential (primary) hypertension: Secondary | ICD-10-CM

## 2013-12-30 LAB — COMPLETE METABOLIC PANEL WITH GFR
ALT: 18 U/L (ref 0–35)
AST: 15 U/L (ref 0–37)
Albumin: 4.1 g/dL (ref 3.5–5.2)
Alkaline Phosphatase: 43 U/L (ref 39–117)
BUN: 16 mg/dL (ref 6–23)
CO2: 28 mEq/L (ref 19–32)
Calcium: 9.6 mg/dL (ref 8.4–10.5)
Chloride: 103 mEq/L (ref 96–112)
Creat: 1.06 mg/dL (ref 0.50–1.10)
GFR, Est African American: 61 mL/min
GFR, Est Non African American: 53 mL/min — ABNORMAL LOW
Glucose, Bld: 126 mg/dL — ABNORMAL HIGH (ref 70–99)
Potassium: 5.2 mEq/L (ref 3.5–5.3)
Sodium: 141 mEq/L (ref 135–145)
Total Bilirubin: 0.3 mg/dL (ref 0.2–1.2)
Total Protein: 6.7 g/dL (ref 6.0–8.3)

## 2013-12-30 LAB — LIPID PANEL
Cholesterol: 134 mg/dL (ref 0–200)
HDL: 31 mg/dL — ABNORMAL LOW (ref >39)
LDL Cholesterol: 75 mg/dL (ref 0–99)
Total CHOL/HDL Ratio: 4.3 Ratio
Triglycerides: 140 mg/dL (ref <150)
VLDL: 28 mg/dL (ref 0–40)

## 2013-12-30 LAB — HEMOGLOBIN A1C
Hgb A1c MFr Bld: 6.8 % — ABNORMAL HIGH (ref <5.7)
Mean Plasma Glucose: 148 mg/dL — ABNORMAL HIGH (ref <117)

## 2014-01-05 ENCOUNTER — Other Ambulatory Visit: Payer: Self-pay | Admitting: Family Medicine

## 2014-01-05 ENCOUNTER — Ambulatory Visit (INDEPENDENT_AMBULATORY_CARE_PROVIDER_SITE_OTHER): Payer: Medicare Other | Admitting: Family Medicine

## 2014-01-05 ENCOUNTER — Encounter: Payer: Self-pay | Admitting: Family Medicine

## 2014-01-05 VITALS — BP 122/74 | HR 96 | Temp 99.2°F | Resp 18 | Ht 63.0 in | Wt 233.0 lb

## 2014-01-05 DIAGNOSIS — E039 Hypothyroidism, unspecified: Secondary | ICD-10-CM

## 2014-01-05 DIAGNOSIS — I10 Essential (primary) hypertension: Secondary | ICD-10-CM

## 2014-01-05 DIAGNOSIS — E785 Hyperlipidemia, unspecified: Secondary | ICD-10-CM

## 2014-01-05 DIAGNOSIS — E119 Type 2 diabetes mellitus without complications: Secondary | ICD-10-CM

## 2014-01-05 LAB — TSH: TSH: 2.53 u[IU]/mL (ref 0.350–4.500)

## 2014-01-05 MED ORDER — SITAGLIPTIN PHOSPHATE 50 MG PO TABS: 50.0000 mg | ORAL_TABLET | Freq: Every day | ORAL | Status: DC

## 2014-01-05 MED ORDER — LEVOTHYROXINE SODIUM 88 MCG PO TABS: 88.0000 ug | ORAL_TABLET | Freq: Every day | ORAL | Status: DC

## 2014-01-05 MED ORDER — SIMVASTATIN 80 MG PO TABS: 40.0000 mg | ORAL_TABLET | Freq: Every day | ORAL | Status: DC

## 2014-01-05 NOTE — Telephone Encounter (Signed)
Rx Refilled  

## 2014-01-05 NOTE — Progress Notes (Signed)
Subjective:    Patient ID: Whitney Morales, female    DOB: October 25, 1940, 73 y.o.   MRN: [id number]  HPI Patient is here today for followup of her multiple medical conditions. Since her last office visit we added Januvia 50 mg by mouth daily. Her hemoglobin A1c has fallen to 6.8 which is the best it has been in years. The patient however is complaining fatigue and weight gain. Her TSH has been borderline elevated the last 2 times we checked it. She's currently taking levothyroxine 75 mcg by mouth daily. Her blood pressure is currently well controlled. She denies any chest pain shortness of breath or dyspnea on exertion. She denies any myalgias or right upper quadrant pain on simvastatin.  Past Medical History  Diagnosis Date  . Diverticulitis   . Diabetes mellitus   . Hypertension   . High cholesterol   . Fatigue   . Obesity   . H/O hiatal hernia   . Goiter     cyst vs goiter on thyroid  . GERD (gastroesophageal reflux disease)     PAST HX--NO PROBLEMS NOW-PT DOES TAKE DAILY TUMS AT BEDTIME  . Abnormal finding on EKG 10/29/2012    FOLLOW UP NUCLEAR STRESS TEST ON 11/05/12 -NORMAL  . Corneal erosion 2012    RIGHT EYE--HAS RESOLVED--NO PROBLEMS SINCE  \ Past Surgical History  Procedure Laterality Date  . Abdominal hysterectomy    . Tonsillectomy    . US echocardiography  03/23/2006    EF 55-60%  . Cardiovascular stress test  06/08/2009    EF 78%, NO ISCHEMIA  . Breast lumpectomy      BENIGN  . Thyroid lobectomy  11/27/2012    Procedure: THYROID LOBECTOMY;  Surgeon: Ralene Ok, MD;  Location: [facility];  Service: General;  Laterality: Right;  Right Thyroid Lobectomy   Current Outpatient Prescriptions on File Prior to Visit  Medication Sig Dispense Refill  . amoxicillin (AMOXIL) 875 MG tablet Take 1 tablet (875 mg total) by mouth 2 (two) times daily.  20 tablet  0  . aspirin 81 MG tablet Take 81 mg by mouth daily.       . Blood Glucose Monitoring Suppl (ACCU-CHEK AVIVA PLUS) W/DEVICE  KIT Pt needs meter-strips(1 bottle =100/5 refills)-lancets(1 box/5 refills)  Checks BS qd DX: 250.00  1 kit  0  . Calcium Carbonate Antacid (TUMS PO) Take by mouth at bedtime. RARELY WOULD NEED ADDITIONAL TUMS DURING DAY IF SHE ATE SPICY FOODS      . eszopiclone (LUNESTA) 1 MG TABS Take 1 mg by mouth as needed. Take immediately before bedtime as needed, patient takes mainly when traveling      . fluticasone (FLONASE) 50 MCG/ACT nasal spray Place 2 sprays into both nostrils as needed.  16 g  11  . glipiZIDE (GLUCOTROL) 5 MG tablet Take 1 tablet (5 mg total) by mouth 2 (two) times daily before a meal.  180 tablet  1  . levothyroxine (SYNTHROID, LEVOTHROID) 75 MCG tablet TAKE 1 TABLET (75 MCG TOTAL) BY MOUTH DAILY BEFORE BREAKFAST.  90 tablet  1  . lisinopril-hydrochlorothiazide (PRINZIDE,ZESTORETIC) 20-25 MG per tablet Take 1 tablet by mouth daily.  90 tablet  1  . loratadine (CLARITIN) 10 MG tablet TAKE 1 TABLET BY MOUTH DAILY  90 tablet  9  . metFORMIN (GLUCOPHAGE) 500 MG tablet TAKE 2 TABLETS (1,000 MG TOTAL) BY MOUTH 2 (TWO) TIMES DAILY WITH A MEAL.  360 tablet  2  . Multiple Vitamin (MULTIVITAMIN WITH MINERALS)  TABS Take 1 tablet by mouth daily.      . simvastatin (ZOCOR) 80 MG tablet Take 40 mg by mouth at bedtime. Takes 1/2 tablet       No current facility-administered medications on file prior to visit.   Allergies  Allergen Reactions  . Clindamycin/Lincomycin     CLINDAMYCIN TAKEN WITH CIPRO CAUSED SEVERE NAUSEA--PT STATES SHE CAN TAKE CIPRO ALONE WITHOUT PROBLEM--IT WAS JUST THE COMBINATION SHE DID NOT TOLERATE.  Marland Kitchen Levaquin [Levofloxacin] Nausea And Vomiting  . Metronidazole Hcl Other (See Comments)    Deathly sick WHEN PT TOOK METRONIDAZOLE WITH CIPRO.  PT STATES SHE HAS TAKEN CIPRO ALONE WITHOUT PROBLEM --JUST DID NOT TOLERATE THE COMBINATION OF BOTH DRUGS TAKEN TOGETHER.   History   Social History  . Marital Status: Married    Spouse Name: N/A    Number of Children: N/A  .  Years of Education: N/A   Occupational History  . Not on file.   Social History Main Topics  . Smoking status: Never Smoker   . Smokeless tobacco: Not on file  . Alcohol Use: No  . Drug Use: No  . Sexual Activity:    Other Topics Concern  . Not on file   Social History Narrative  . No narrative on file       Review of Systems  Constitutional: Positive for fatigue and unexpected weight change. Negative for chills, diaphoresis, activity change and appetite change.  HENT: Negative.   Eyes: Negative.   Respiratory: Negative.   Cardiovascular: Negative.   Gastrointestinal: Negative.   Endocrine: Negative for cold intolerance, heat intolerance, polydipsia, polyphagia and polyuria.  Genitourinary: Negative.        Objective:   Physical Exam  Vitals reviewed. Constitutional: She is oriented to person, place, and time. She appears well-developed and well-nourished. No distress.  Eyes: Conjunctivae are normal. No scleral icterus.  Neck: Neck supple. No JVD present. No thyromegaly present.  Cardiovascular: Normal rate, regular rhythm, normal heart sounds and intact distal pulses.  Exam reveals no gallop and no friction rub.   No murmur heard. Pulmonary/Chest: Effort normal and breath sounds normal. No respiratory distress. She has no wheezes. She has no rales. She exhibits no tenderness.  Abdominal: Soft. Bowel sounds are normal. She exhibits no distension and no mass. There is no tenderness. There is no rebound and no guarding.  Musculoskeletal: She exhibits no edema.  Lymphadenopathy:    She has no cervical adenopathy.  Neurological: She is alert and oriented to person, place, and time. She has normal reflexes. She displays normal reflexes. No cranial nerve deficit. Coordination normal.  Skin: Skin is warm. No rash noted. She is not diaphoretic. No erythema. No pallor.          Assessment & Plan:  1. Unspecified hypothyroidism Repeat TSH. If her TSH is borderline  elevated I increased her levothyroxine to 88 mcg by mouth daily and recheck TSH in 6 weeks to help treat her increased weight gain and fatigue - TSH  2. HTN (hypertension) Patient blood pressure is currently well controlled continue current medications at the present dosages.  3. HLD (hyperlipidemia) Patient's LDL cholesterol is less than 100. I recommended increasing aerobic exercise to help address her low HDL  4. Type II or unspecified type diabetes mellitus without mention of complication, not stated as uncontrolled Patient's hemoglobin A1c is the best it has been in years. Will continue januvia 50 mg by mouth daily.  I recommended an annual exam. The  patient currently taking aspirin 81 mg by mouth daily. The remainder of her preventive care is up to date

## 2014-01-07 ENCOUNTER — Other Ambulatory Visit: Payer: Self-pay | Admitting: Family Medicine

## 2014-01-07 MED ORDER — SIMVASTATIN 80 MG PO TABS: 40.0000 mg | ORAL_TABLET | Freq: Every day | ORAL | Status: DC

## 2014-01-07 NOTE — Telephone Encounter (Signed)
Rx Refilled  

## 2014-01-12 ENCOUNTER — Other Ambulatory Visit: Payer: Self-pay | Admitting: Family Medicine

## 2014-01-12 NOTE — Telephone Encounter (Signed)
Medication refilled per protocol. 

## 2014-01-14 ENCOUNTER — Other Ambulatory Visit: Payer: Self-pay | Admitting: Family Medicine

## 2014-01-15 NOTE — Telephone Encounter (Signed)
Ok to refill??  Last office visit 01/07/2014. Medication has not been filled by [facility].

## 2014-01-15 NOTE — Telephone Encounter (Signed)
Ok to rf x 3

## 2014-01-15 NOTE — Telephone Encounter (Signed)
Medication called to pharmacy. 

## 2014-01-16 ENCOUNTER — Encounter: Payer: Self-pay | Admitting: Family Medicine

## 2014-01-16 ENCOUNTER — Other Ambulatory Visit: Payer: Self-pay | Admitting: Family Medicine

## 2014-01-16 LAB — HM DIABETES EYE EXAM

## 2014-01-16 NOTE — Telephone Encounter (Signed)
Pharmacy denies receiving refill phoned in yesterday

## 2014-02-24 ENCOUNTER — Other Ambulatory Visit: Payer: Self-pay | Admitting: Family Medicine

## 2014-03-11 ENCOUNTER — Other Ambulatory Visit: Payer: Self-pay | Admitting: Family Medicine

## 2014-04-14 ENCOUNTER — Other Ambulatory Visit: Payer: Self-pay | Admitting: Family Medicine

## 2014-04-14 ENCOUNTER — Other Ambulatory Visit: Payer: Medicare Other

## 2014-04-14 DIAGNOSIS — E785 Hyperlipidemia, unspecified: Secondary | ICD-10-CM

## 2014-04-14 DIAGNOSIS — Z79899 Other long term (current) drug therapy: Secondary | ICD-10-CM

## 2014-04-14 DIAGNOSIS — E119 Type 2 diabetes mellitus without complications: Secondary | ICD-10-CM

## 2014-04-14 LAB — COMPLETE METABOLIC PANEL WITH GFR
ALT: 17 U/L (ref 0–35)
AST: 13 U/L (ref 0–37)
Albumin: 4.1 g/dL (ref 3.5–5.2)
Alkaline Phosphatase: 48 U/L (ref 39–117)
BUN: 20 mg/dL (ref 6–23)
CO2: 28 mEq/L (ref 19–32)
Calcium: 9.9 mg/dL (ref 8.4–10.5)
Chloride: 101 mEq/L (ref 96–112)
Creat: 1.1 mg/dL (ref 0.50–1.10)
GFR, Est African American: 59 mL/min — ABNORMAL LOW
GFR, Est Non African American: 51 mL/min — ABNORMAL LOW
Glucose, Bld: 168 mg/dL — ABNORMAL HIGH (ref 70–99)
Potassium: 5.2 mEq/L (ref 3.5–5.3)
Sodium: 139 mEq/L (ref 135–145)
Total Bilirubin: 0.3 mg/dL (ref 0.2–1.2)
Total Protein: 6.9 g/dL (ref 6.0–8.3)

## 2014-04-14 LAB — LIPID PANEL
Cholesterol: 147 mg/dL (ref 0–200)
HDL: 39 mg/dL — ABNORMAL LOW (ref >39)
LDL Cholesterol: 83 mg/dL (ref 0–99)
Total CHOL/HDL Ratio: 3.8 Ratio
Triglycerides: 126 mg/dL (ref <150)
VLDL: 25 mg/dL (ref 0–40)

## 2014-04-14 LAB — HEMOGLOBIN A1C
Hgb A1c MFr Bld: 7 % — ABNORMAL HIGH (ref <5.7)
Mean Plasma Glucose: 154 mg/dL — ABNORMAL HIGH (ref <117)

## 2014-04-20 ENCOUNTER — Ambulatory Visit (INDEPENDENT_AMBULATORY_CARE_PROVIDER_SITE_OTHER): Payer: Medicare Other | Admitting: Family Medicine

## 2014-04-20 ENCOUNTER — Encounter: Payer: Self-pay | Admitting: Family Medicine

## 2014-04-20 VITALS — BP 150/70 | HR 96 | Temp 98.7°F | Resp 18 | Ht 63.0 in | Wt 238.0 lb

## 2014-04-20 DIAGNOSIS — M545 Low back pain, unspecified: Secondary | ICD-10-CM

## 2014-04-20 DIAGNOSIS — E785 Hyperlipidemia, unspecified: Secondary | ICD-10-CM

## 2014-04-20 DIAGNOSIS — E119 Type 2 diabetes mellitus without complications: Secondary | ICD-10-CM

## 2014-04-20 DIAGNOSIS — I10 Essential (primary) hypertension: Secondary | ICD-10-CM

## 2014-04-20 LAB — TSH: TSH: 3.02 u[IU]/mL (ref 0.350–4.500)

## 2014-04-20 MED ORDER — TIZANIDINE HCL 4 MG PO CAPS: 4.0000 mg | ORAL_CAPSULE | Freq: Three times a day (TID) | ORAL | Status: DC | PRN

## 2014-04-20 NOTE — Progress Notes (Signed)
Subjective:    Patient ID: Whitney Morales, female    DOB: 1941-10-16, 73 y.o.   MRN: [id number]  HPI Patient was last seen in March. Since that time, she freely admits that she has become very lackadaisical with her diet.  She is gone several week long vacations and has indulged in dietary indiscretions. She has gained 5 pounds. Her hemoglobin A1c has risen from 6.8-7.0. She is also complaining of low back pain. Ongoing for the last week. She denies any specific injury. However she feels tightness and stiffness in her midline lower back. There is no radiation signs of lumbar radiculopathy. She denies any symptoms of cauda equina syndrome. Her blood pressure slightly elevated today due to pain in her flank also weight gain. Her previous blood pressures have been excellent. She denies any chest pain shortness of breath or dyspnea on exertion. Her cholesterol is also acceptable. Her most recent labwork as listed below: Lab on 04/14/2014  Component Date Value Ref Range Status  . Hemoglobin A1C 04/14/2014 7.0* <5.7 % Final   Comment:                                                                                                 According to the ADA Clinical Practice Recommendations for 2011, when                          HbA1c is used as a screening test:                                                       >=6.5%   Diagnostic of Diabetes Mellitus                                     (if abnormal result is confirmed)                                                     5.7-6.4%   Increased risk of developing Diabetes Mellitus                                                     References:Diagnosis and Classification of Diabetes Mellitus,Diabetes                          [id number],2863,81(RRNHA 1):S62-S69 and Standards of Medical Care in  Diabetes - 2011,Diabetes Care,2011,34 (Suppl 1):S11-S61.                             . Mean Plasma Glucose 04/14/2014 154* <117 mg/dL  Final  . Cholesterol 04/14/2014 147  0 - 200 mg/dL Final   Comment: ATP III Classification:                                < 200        mg/dL        Desirable                               200 - 239     mg/dL        Borderline High                               >= 240        mg/dL        High                             . Triglycerides 04/14/2014 126  <150 mg/dL Final  . HDL 04/14/2014 39* >39 mg/dL Final  . Total CHOL/HDL Ratio 04/14/2014 3.8   Final  . VLDL 04/14/2014 25  0 - 40 mg/dL Final  . LDL Cholesterol 04/14/2014 83  0 - 99 mg/dL Final   Comment:                            Total Cholesterol/HDL Ratio:CHD Risk                                                 Coronary Heart Disease Risk Table                                                                 Men       Women                                   1/2 Average Risk              3.4        3.3                                       Average Risk              5.0        4.4                                    2X Average Risk  9.6        7.1                                    3X Average Risk             23.4       11.0                          Use the calculated Patient Ratio above and the CHD Risk table                           to determine the patient's CHD Risk.                          ATP III Classification (LDL):                                < 100        mg/dL         Optimal                               100 - 129     mg/dL         Near or Above Optimal                               130 - 159     mg/dL         Borderline High                               160 - 189     mg/dL         High                                > 190        mg/dL         Very High                             . Sodium 04/14/2014 139  135 - 145 mEq/L Final  . Potassium 04/14/2014 5.2  3.5 - 5.3 mEq/L Final  . Chloride 04/14/2014 101  96 - 112 mEq/L Final  . CO2 04/14/2014 28  19 - 32 mEq/L Final  . Glucose, Bld 04/14/2014 168* 70 - 99  mg/dL Final  . BUN 04/14/2014 20  6 - 23 mg/dL Final  . Creat 04/14/2014 1.10  0.50 - 1.10 mg/dL Final  . Total Bilirubin 04/14/2014 0.3  0.2 - 1.2 mg/dL Final  . Alkaline Phosphatase 04/14/2014 48  39 - 117 U/L Final  . AST 04/14/2014 13  0 - 37 U/L Final  . ALT 04/14/2014 17  0 - 35 U/L Final  . Total Protein 04/14/2014 6.9  6.0 - 8.3 g/dL Final  . Albumin 04/14/2014 4.1  3.5 - 5.2 g/dL Final  . Calcium 04/14/2014 9.9  8.4 - 10.5 mg/dL Final  . GFR, Est African  American 04/14/2014 59*  Final  . GFR, Est Non African American 04/14/2014 51*  Final   Comment:                            The estimated GFR is a calculation valid for adults (>=6 years old)                          that uses the CKD-EPI algorithm to adjust for age and sex. It is                            not to be used for children, pregnant women, hospitalized patients,                             patients on dialysis, or with rapidly changing kidney function.                          According to the NKDEP, eGFR >89 is normal, 60-89 shows mild                          impairment, 30-59 shows moderate impairment, 15-29 shows severe                          impairment and <15 is ESRD.                              Past Medical History  Diagnosis Date  . Diverticulitis   . Diabetes mellitus   . Hypertension   . High cholesterol   . Fatigue   . Obesity   . H/O hiatal hernia   . Goiter     cyst vs goiter on thyroid  . GERD (gastroesophageal reflux disease)     PAST HX--NO PROBLEMS NOW-PT DOES TAKE DAILY TUMS AT BEDTIME  . Abnormal finding on EKG 10/29/2012    FOLLOW UP NUCLEAR STRESS TEST ON 11/05/12 -NORMAL  . 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 aspirin 81 MG tablet Take 81 mg by mouth daily.       . Blood Glucose Monitoring Suppl (ACCU-CHEK AVIVA PLUS) W/DEVICE KIT Pt needs meter-strips(1  bottle =100/5 refills)-lancets(1 box/5 refills)  Checks BS qd DX: 250.00  1 kit  0  . Calcium Carbonate Antacid (TUMS PO) Take by mouth at bedtime. RARELY WOULD NEED ADDITIONAL TUMS DURING DAY IF SHE ATE SPICY FOODS      . fluticasone (FLONASE) 50 MCG/ACT nasal spray Place 2 sprays into both nostrils as needed.  16 g  11  . glipiZIDE (GLUCOTROL) 5 MG tablet TAKE 1 TABLET BY MOUTH TWICE DAILY BEFORE A MEAL  180 tablet  1  . levothyroxine (SYNTHROID, LEVOTHROID) 75 MCG tablet TAKE ONE TABLET BY MOUTH ONCE DAILY BEFORE BREAKFAST  90 tablet  3  . lisinopril-hydrochlorothiazide (PRINZIDE,ZESTORETIC) 20-25 MG per tablet TAKE 1 TABLET BY MOUTH DAILY.  90 tablet  1  . loratadine (CLARITIN) 10 MG tablet TAKE 1 TABLET BY MOUTH DAILY  90 tablet  9  . LUNESTA 2 MG TABS tablet TAKE  1 TABLET AT BEDTIME  30 tablet  3  . metFORMIN (GLUCOPHAGE) 500 MG tablet TAKE 2 TABLETS BY MOUTH TWICE DAILY WITH A MEAL  360 tablet  1  . Multiple Vitamin (MULTIVITAMIN WITH MINERALS) TABS Take 1 tablet by mouth daily.      . simvastatin (ZOCOR) 80 MG tablet Take 0.5 tablets (40 mg total) by mouth at bedtime. Takes 1/2 tablet  90 tablet  1  . sitaGLIPtin (JANUVIA) 50 MG tablet Take 1 tablet (50 mg total) by mouth daily.  30 tablet  11   No current facility-administered medications on file prior to visit.   Allergies  Allergen Reactions  . Clindamycin/Lincomycin     CLINDAMYCIN TAKEN WITH CIPRO CAUSED SEVERE NAUSEA--PT STATES SHE CAN TAKE CIPRO ALONE WITHOUT PROBLEM--IT WAS JUST THE COMBINATION SHE DID NOT TOLERATE.  Marland Kitchen Levaquin [Levofloxacin] Nausea And Vomiting  . Metronidazole Hcl Other (See Comments)    Deathly sick WHEN PT TOOK METRONIDAZOLE WITH CIPRO.  PT STATES SHE HAS TAKEN CIPRO ALONE WITHOUT PROBLEM --JUST DID NOT TOLERATE THE COMBINATION OF BOTH DRUGS TAKEN TOGETHER.   History   Social History  . Marital Status: Married    Spouse Name: N/A    Number of Children: N/A  . Years of Education: N/A   Occupational  History  . Not on file.   Social History Main Topics  . Smoking status: Never Smoker   . Smokeless tobacco: Not on file  . Alcohol Use: No  . Drug Use: No  . Sexual Activity:    Other Topics Concern  . Not on file   Social History Narrative  . No narrative on file      Review of Systems  All other systems reviewed and are negative.      Objective:   Physical Exam  Vitals reviewed. Neck: No JVD present. No thyromegaly present.  Cardiovascular: Normal rate, regular rhythm and normal heart sounds.   No murmur heard. Pulmonary/Chest: Effort normal and breath sounds normal. No respiratory distress. She has no wheezes. She has no rales. She exhibits no tenderness.  Abdominal: Soft. Bowel sounds are normal. She exhibits no distension. There is no tenderness. There is no rebound and no guarding.  Musculoskeletal: She exhibits no edema.  Lymphadenopathy:    She has no cervical adenopathy.          Assessment & Plan:  1. Midline low back pain without sciatica This represents a muscle. Gave patient prescription for Zanaflex. I anticipate her pain will improve gradually over the next week. She is to return immediately if symptoms worsen. - tiZANidine (ZANAFLEX) 4 MG capsule; Take 1 capsule (4 mg total) by mouth 3 (three) times daily as needed for muscle spasms.  Dispense: 30 capsule; Refill: 0  I will check a TSH for hypothyroidism to ensure that she is on the appropriate dose of levothyroxine - TSH  2. Type 2 diabetes mellitus without complication I recommended 10-15 pound weight loss. Recheck hemoglobin A1c along with urine microalbumin in 6 months.  3. HLD (hyperlipidemia) Cholesterol is excellent. I made no changes in her medication at this time.  4. Essential hypertension Recommended a low-sodium diet and 10-15 pounds weight loss. Recheck blood pressure in one month.

## 2014-04-21 ENCOUNTER — Other Ambulatory Visit: Payer: Self-pay | Admitting: Family Medicine

## 2014-04-21 DIAGNOSIS — E785 Hyperlipidemia, unspecified: Secondary | ICD-10-CM

## 2014-04-21 DIAGNOSIS — Z79899 Other long term (current) drug therapy: Secondary | ICD-10-CM

## 2014-04-21 DIAGNOSIS — E038 Other specified hypothyroidism: Secondary | ICD-10-CM

## 2014-04-21 DIAGNOSIS — E119 Type 2 diabetes mellitus without complications: Secondary | ICD-10-CM

## 2014-04-21 DIAGNOSIS — I10 Essential (primary) hypertension: Secondary | ICD-10-CM

## 2014-04-26 IMAGING — CT CT ABD-PELV W/ CM
2 of 5 series · 17 of 46 positions shown, 19 images · IV contrast (READICAT/WATER & [ID] OMNI 300)
Comparison: None.

CLINICAL DATA: Left abdominal pain.  Prior total hysterectomy.

BUN and creatinine were obtained on site at [HOSPITAL] at
[HOSPITAL]..
Results:   Creatinine 1.0 mg/dL.
CT ABDOMEN AND PELVIS WITH CONTRAST
TECHNIQUE: Multidetector CT imaging of the abdomen and pelvis was
performed following the standard protocol during bolus
administration of intravenous contrast.
Contrast: 125mL OMNIPAQUE IOHEXOL 300 MG/ML  SOLN

[Series 2: abd/pelvis with · axial · 0.86mm/px · z∈[-394,+26]mm · 14 of 94 slices shown, 16 images]
[im 5/94  soft-tissue]
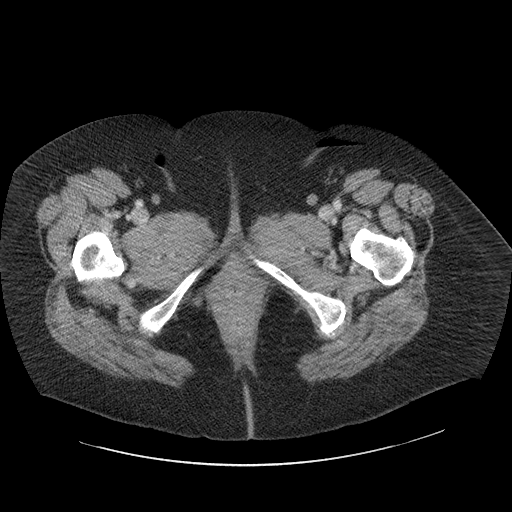
[im 5/94  bone]
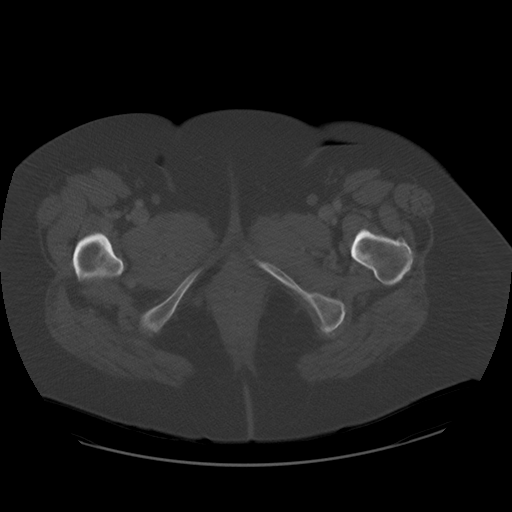
[im 14/94  soft-tissue]
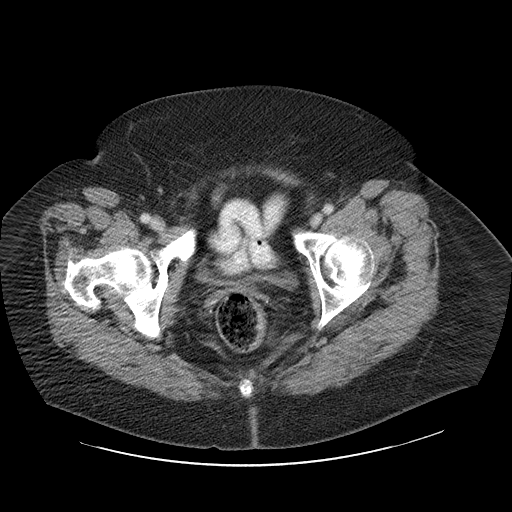
[im 19/94  soft-tissue]
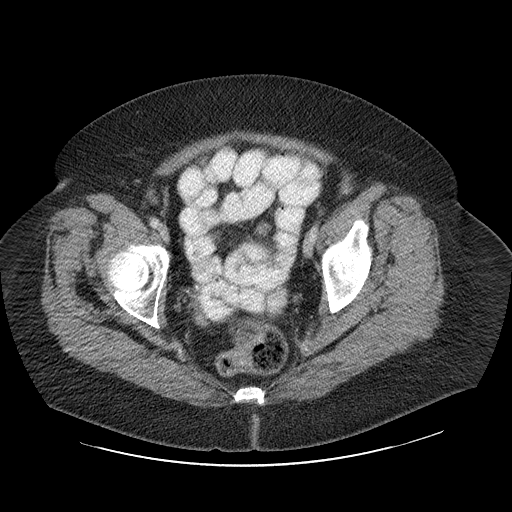
[im 24/94  soft-tissue]
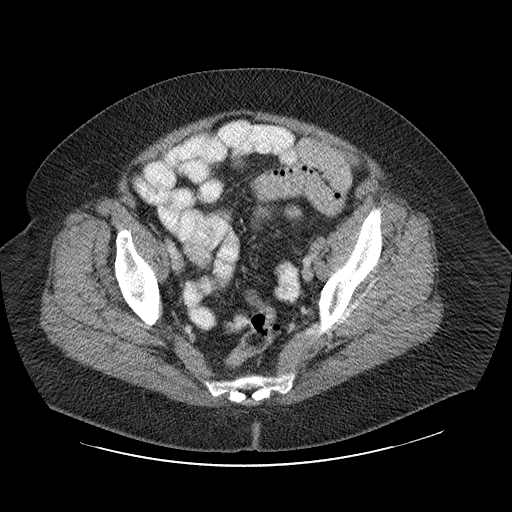
[im 33/94  soft-tissue]
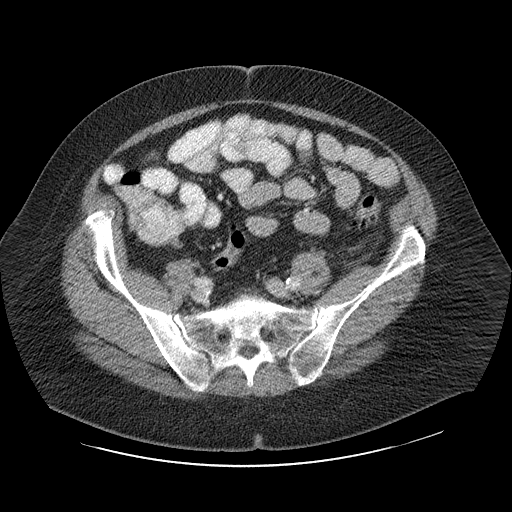
[im 38/94  soft-tissue]
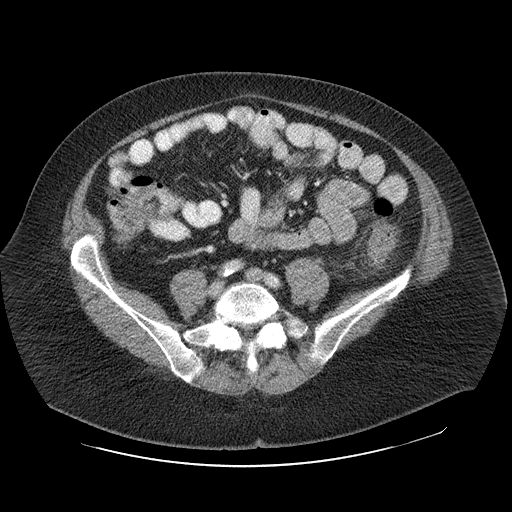
[im 42/94  soft-tissue]
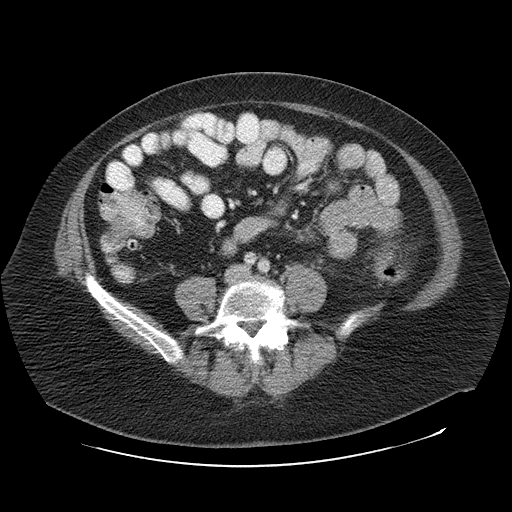
[im 52/94  soft-tissue]
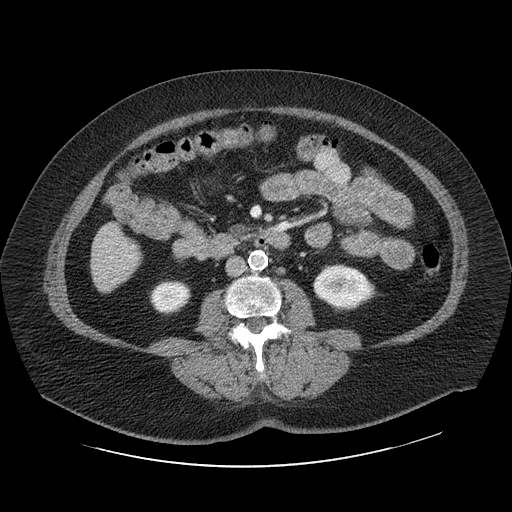
[im 56/94  soft-tissue]
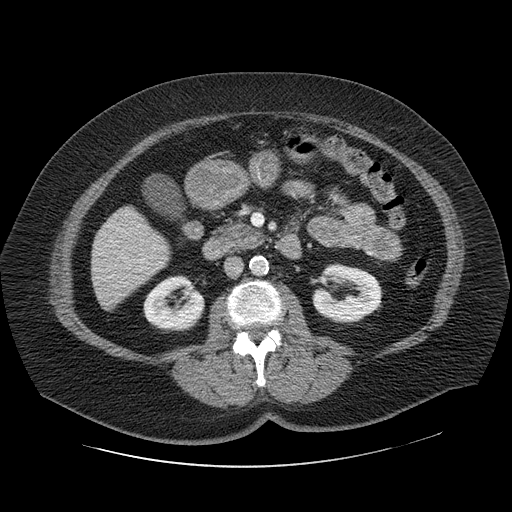
[im 56/94  bone]
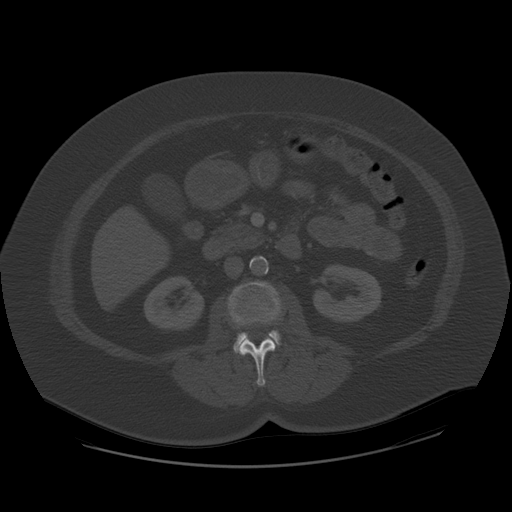
[im 61/94  soft-tissue]
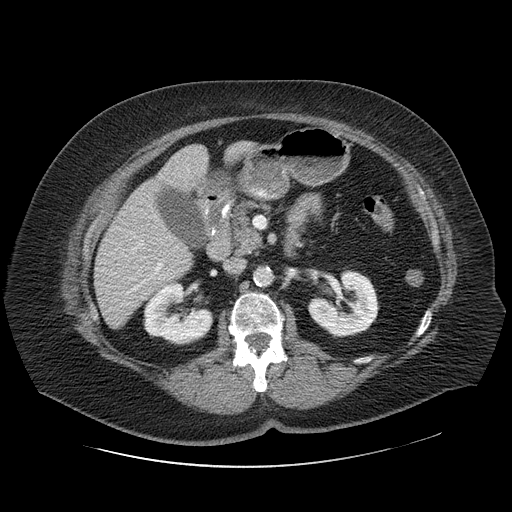
[im 70/94  soft-tissue]
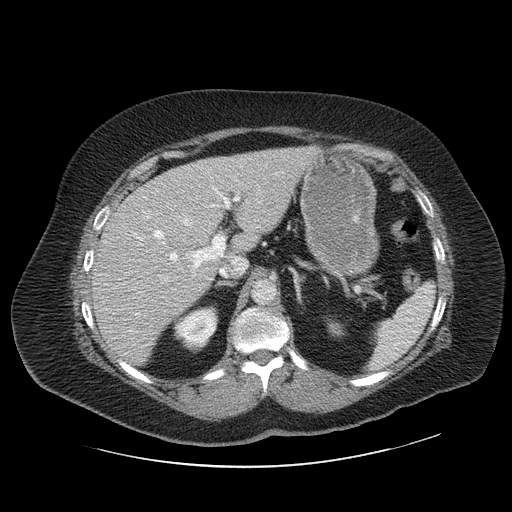
[im 75/94  soft-tissue]
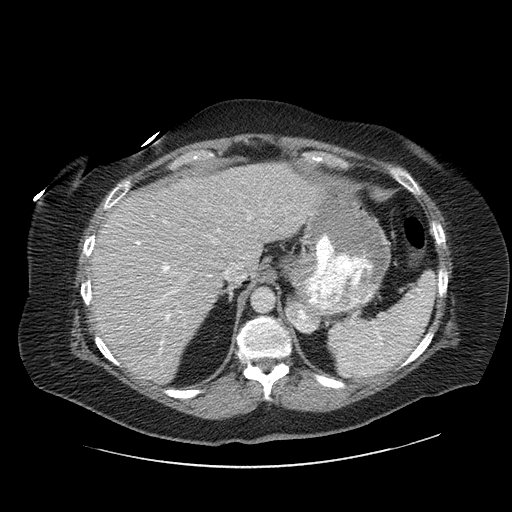
[im 80/94  soft-tissue]
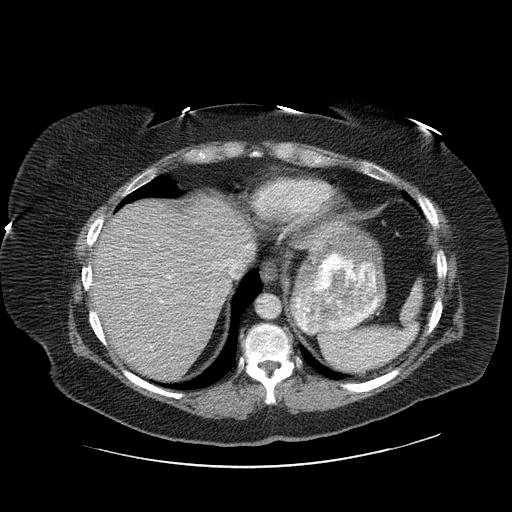
[im 89/94  soft-tissue]
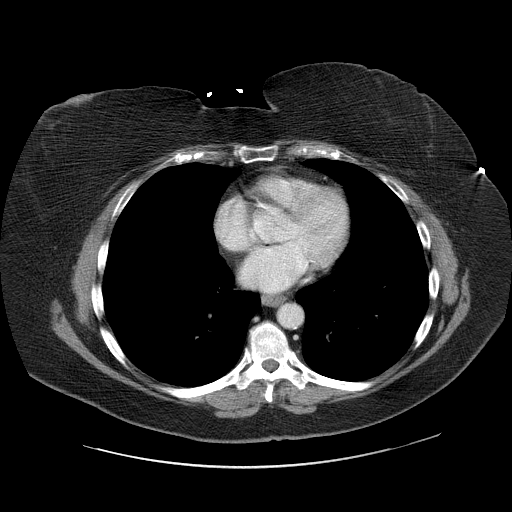

[Series 400: coronal · coronal · 0.95mm/px · 3 of 143 slices shown]
[im 48/143  soft-tissue]
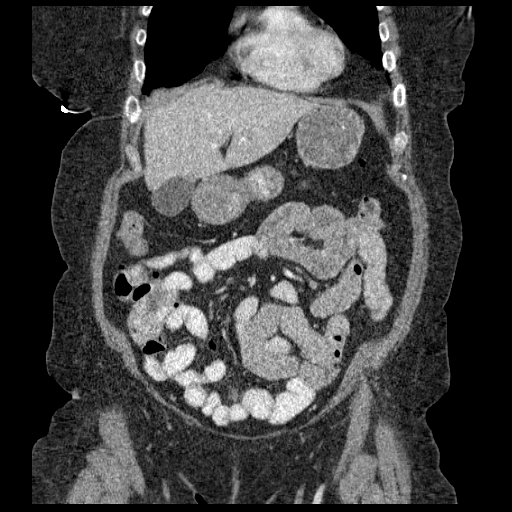
[im 64/143  soft-tissue]
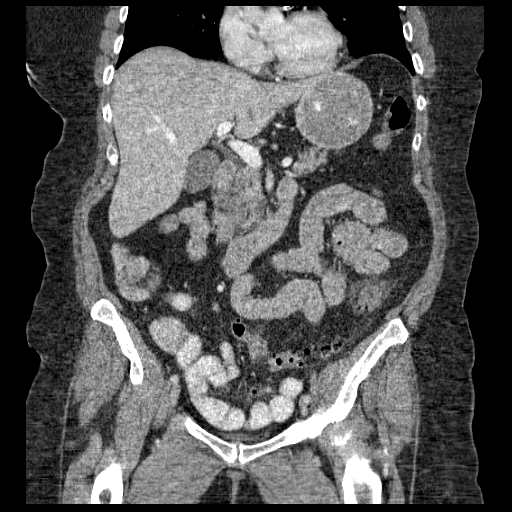
[im 79/143  soft-tissue]
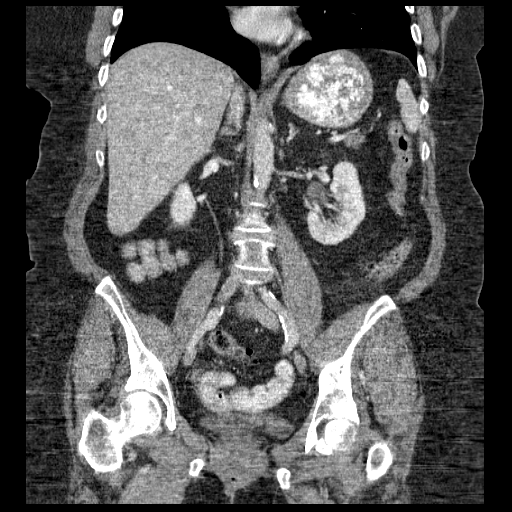

[17 of 46 positions shown; findings below may reference images not displayed]

FINDINGS: Inflammation  distal descending colon in the setting of
diverticula consistent with diverticulitis without drainable
abscess.

Fatty infiltration of the liver without focal worrisome hepatic
lesion.

No worrisome splenic, pancreatic, adrenal or renal lesion.  Gastric
diverticulum projects at the level of the left adrenal gland.  No
calcified gallstones.

Prominent atherosclerotic type changes including significant
coronary artery calcification and calcification of the abdominal
aorta and branch vessels.  No abdominal aortic aneurysm.  Narrowing
of the origin of the celiac artery and superior mesenteric artery.

Post hysterectomy.

Mild degenerative changes lower lumbar spine.  No bony destructive
lesion.

Decompressed urinary bladder.
IMPRESSION: Descending colon diverticulitis without abscess.

Atherosclerotic type changes including prominent coronary artery
and aortic calcification.

Fatty infiltration of the liver.

Please see above.

This has been Rakhee Mancuso report utilizing dashboard call
feature..

## 2014-06-30 ENCOUNTER — Telehealth: Payer: Self-pay | Admitting: Family Medicine

## 2014-06-30 NOTE — Telephone Encounter (Signed)
Pt states that since starting the Januvia she has had worsening symptoms of fatigue and just not feeling well. I recommended that she stop the medication and see if symptoms improve.  If so d/c med until appt if not pt may have something else going on that she will need to be seen for.  She is also to keep a check on her BS while off her med - she does have an appt within the next month.

## 2014-06-30 NOTE — Telephone Encounter (Signed)
Patient is calling to speak with you about her junuvia, has questions about stopping taking it, and side affects it may have  [phone]

## 2014-07-02 ENCOUNTER — Telehealth: Payer: Self-pay | Admitting: Family Medicine

## 2014-07-02 NOTE — Telephone Encounter (Signed)
LM pt is needing to schedule [facility]

## 2014-07-30 ENCOUNTER — Other Ambulatory Visit: Payer: Self-pay | Admitting: Family Medicine

## 2014-08-10 ENCOUNTER — Other Ambulatory Visit: Payer: Medicare Other

## 2014-08-10 ENCOUNTER — Other Ambulatory Visit: Payer: Self-pay | Admitting: Family Medicine

## 2014-08-10 DIAGNOSIS — I10 Essential (primary) hypertension: Secondary | ICD-10-CM | POA: Insufficient documentation

## 2014-08-10 DIAGNOSIS — Z0000 Encounter for general adult medical examination without abnormal findings: Secondary | ICD-10-CM

## 2014-08-10 DIAGNOSIS — Z79899 Other long term (current) drug therapy: Secondary | ICD-10-CM

## 2014-08-10 DIAGNOSIS — E785 Hyperlipidemia, unspecified: Secondary | ICD-10-CM

## 2014-08-10 DIAGNOSIS — E119 Type 2 diabetes mellitus without complications: Secondary | ICD-10-CM

## 2014-08-10 DIAGNOSIS — E039 Hypothyroidism, unspecified: Secondary | ICD-10-CM

## 2014-08-10 LAB — LIPID PANEL
Cholesterol: 153 mg/dL (ref 0–200)
HDL: 36 mg/dL — ABNORMAL LOW (ref >39)
LDL Cholesterol: 86 mg/dL (ref 0–99)
Total CHOL/HDL Ratio: 4.3 Ratio
Triglycerides: 154 mg/dL — ABNORMAL HIGH (ref <150)
VLDL: 31 mg/dL (ref 0–40)

## 2014-08-10 LAB — COMPLETE METABOLIC PANEL WITH GFR
ALT: 17 U/L (ref 0–35)
AST: 14 U/L (ref 0–37)
Albumin: 4.2 g/dL (ref 3.5–5.2)
Alkaline Phosphatase: 52 U/L (ref 39–117)
BUN: 15 mg/dL (ref 6–23)
CO2: 28 mEq/L (ref 19–32)
Calcium: 9.4 mg/dL (ref 8.4–10.5)
Chloride: 102 mEq/L (ref 96–112)
Creat: 1.02 mg/dL (ref 0.50–1.10)
GFR, Est African American: 64 mL/min
GFR, Est Non African American: 55 mL/min — ABNORMAL LOW
Glucose, Bld: 151 mg/dL — ABNORMAL HIGH (ref 70–99)
Potassium: 4.9 mEq/L (ref 3.5–5.3)
Sodium: 140 mEq/L (ref 135–145)
Total Bilirubin: 0.3 mg/dL (ref 0.2–1.2)
Total Protein: 6.7 g/dL (ref 6.0–8.3)

## 2014-08-10 LAB — HEMOGLOBIN A1C
Hgb A1c MFr Bld: 7.8 % — ABNORMAL HIGH (ref <5.7)
Mean Plasma Glucose: 177 mg/dL — ABNORMAL HIGH (ref <117)

## 2014-08-10 LAB — TSH: TSH: 3.505 u[IU]/mL (ref 0.350–4.500)

## 2014-08-11 LAB — CBC WITH DIFFERENTIAL/PLATELET
Basophils Absolute: 0.1 10*3/uL (ref 0.0–0.1)
Basophils Relative: 1 % (ref 0–1)
Eosinophils Absolute: 0.2 10*3/uL (ref 0.0–0.7)
Eosinophils Relative: 2 % (ref 0–5)
HCT: 37.9 % (ref 36.0–46.0)
Hemoglobin: 12.6 g/dL (ref 12.0–15.0)
Lymphocytes Relative: 24 % (ref 12–46)
Lymphs Abs: 2 10*3/uL (ref 0.7–4.0)
MCH: 30.4 pg (ref 26.0–34.0)
MCHC: 33.2 g/dL (ref 30.0–36.0)
MCV: 91.5 fL (ref 78.0–100.0)
Monocytes Absolute: 0.8 10*3/uL (ref 0.1–1.0)
Monocytes Relative: 9 % (ref 3–12)
Neutro Abs: 5.4 10*3/uL (ref 1.7–7.7)
Neutrophils Relative %: 64 % (ref 43–77)
Platelets: 296 10*3/uL (ref 150–400)
RBC: 4.14 MIL/uL (ref 3.87–5.11)
RDW: 13.4 % (ref 11.5–15.5)
WBC: 8.4 10*3/uL (ref 4.0–10.5)

## 2014-08-17 ENCOUNTER — Encounter: Payer: Medicare Other | Admitting: Family Medicine

## 2014-08-17 ENCOUNTER — Encounter: Payer: Self-pay | Admitting: Family Medicine

## 2014-08-17 ENCOUNTER — Ambulatory Visit (INDEPENDENT_AMBULATORY_CARE_PROVIDER_SITE_OTHER): Payer: Medicare Other | Admitting: Family Medicine

## 2014-08-17 VITALS — BP 160/90 | HR 86 | Temp 98.7°F | Resp 18 | Ht 63.0 in | Wt 231.0 lb

## 2014-08-17 DIAGNOSIS — Z0000 Encounter for general adult medical examination without abnormal findings: Secondary | ICD-10-CM

## 2014-08-17 NOTE — Progress Notes (Signed)
Subjective:    Patient ID: Whitney Morales, female    DOB: [birth year], 73 y.o.   MRN: [id number]  HPI  Patient is a very pleasant 73 year who is here today for complete physical exam. Her mammogram is up-to-date. Her colonoscopy is scheduled for next year. She has a history of a hysterectomy and therefore does not require Pap smears. Patient has had Prevnar 13, Pneumovax 23, and her flu shot. She is also had her shingles vaccine. All of her preventative care is up-to-date. Unfortunately her blood pressure is very high today at 160/90. Her hemoglobin A1c has also risen from 7.0-7.8.  The patient admits that she quit Januvia for 6 weeks because she did not like the way the medication made her feel. Off the medication her sugars rose into the 200s. She would like to try an alternative to the Januvia. Lab on 08/10/2014  Component Date Value Ref Range Status  . Hemoglobin A1C 08/10/2014 7.8* <5.7 % Final   Comment:                                                                                                 According to the ADA Clinical Practice Recommendations for 2011, when                          HbA1c is used as a screening test:                                                       >=6.5%   Diagnostic of Diabetes Mellitus                                     (if abnormal result is confirmed)                                                     5.7-6.4%   Increased risk of developing Diabetes Mellitus                                                     References:Diagnosis and Classification of Diabetes Mellitus,Diabetes                          IWLN,9892,11(HERDE 1):S62-S69 and Standards of Medical Care in                                  Diabetes - 2011,Diabetes  VQXI,5038,88 (Suppl 1):S11-S61.                             . Mean Plasma Glucose 08/10/2014 177* <117 mg/dL Final  . Cholesterol 08/10/2014 153  0 - 200 mg/dL Final   Comment: ATP III Classification:       < 200        mg/dL        Desirable                               200 - 239     mg/dL        Borderline High                               >= 240        mg/dL        High                             . Triglycerides 08/10/2014 154* <150 mg/dL Final  . HDL 08/10/2014 36* >39 mg/dL Final  . Total CHOL/HDL Ratio 08/10/2014 4.3   Final  . VLDL 08/10/2014 31  0 - 40 mg/dL Final  . LDL Cholesterol 08/10/2014 86  0 - 99 mg/dL Final   Comment:                            Total Cholesterol/HDL Ratio:CHD Risk                                                 Coronary Heart Disease Risk Table                                                                 Men       Women                                   1/2 Average Risk              3.4        3.3                                       Average Risk              5.0        4.4                                    2X Average Risk              9.6        7.1  3X Average Risk             23.4       11.0                          Use the calculated Patient Ratio above and the CHD Risk table                           to determine the patient's CHD Risk.                          ATP III Classification (LDL):                                < 100        mg/dL         Optimal                               100 - 129     mg/dL         Near or Above Optimal                               130 - 159     mg/dL         Borderline High                               160 - 189     mg/dL         High                                > 190        mg/dL         Very High                             . TSH 08/10/2014 3.505  0.350 - 4.500 uIU/mL Final  . Sodium 08/10/2014 140  135 - 145 mEq/L Final  . Potassium 08/10/2014 4.9  3.5 - 5.3 mEq/L Final  . Chloride 08/10/2014 102  96 - 112 mEq/L Final  . CO2 08/10/2014 28  19 - 32 mEq/L Final  . Glucose, Bld 08/10/2014 151* 70 - 99 mg/dL Final  . BUN 08/10/2014 15  6 - 23 mg/dL Final  . Creat  08/10/2014 1.02  0.50 - 1.10 mg/dL Final  . Total Bilirubin 08/10/2014 0.3  0.2 - 1.2 mg/dL Final  . Alkaline Phosphatase 08/10/2014 52  39 - 117 U/L Final  . AST 08/10/2014 14  0 - 37 U/L Final  . ALT 08/10/2014 17  0 - 35 U/L Final  . Total Protein 08/10/2014 6.7  6.0 - 8.3 g/dL Final  . Albumin 08/10/2014 4.2  3.5 - 5.2 g/dL Final  . Calcium 08/10/2014 9.4  8.4 - 10.5 mg/dL Final  . GFR, Est African American 08/10/2014 64   Final  . GFR, Est Non African American 08/10/2014 55*  Final   Comment:  The estimated GFR is a calculation valid for adults (>=93 years old)                          that uses the CKD-EPI algorithm to adjust for age and sex. It is                            not to be used for children, pregnant women, hospitalized patients,                             patients on dialysis, or with rapidly changing kidney function.                          According to the NKDEP, eGFR >89 is normal, 60-89 shows mild                          impairment, 30-59 shows moderate impairment, 15-29 shows severe                          impairment and <15 is ESRD.                             Orders Only on 08/10/2014  Component Date Value Ref Range Status  . WBC 08/10/2014 8.4  4.0 - 10.5 K/uL Final  . RBC 08/10/2014 4.14  3.87 - 5.11 MIL/uL Final  . Hemoglobin 08/10/2014 12.6  12.0 - 15.0 g/dL Final  . HCT 08/10/2014 37.9  36.0 - 46.0 % Final  . MCV 08/10/2014 91.5  78.0 - 100.0 fL Final  . MCH 08/10/2014 30.4  26.0 - 34.0 pg Final  . MCHC 08/10/2014 33.2  30.0 - 36.0 g/dL Final  . RDW 08/10/2014 13.4  11.5 - 15.5 % Final  . Platelets 08/10/2014 296  150 - 400 K/uL Final  . Neutrophils Relative % 08/10/2014 64  43 - 77 % Final  . Neutro Abs 08/10/2014 5.4  1.7 - 7.7 K/uL Final  . Lymphocytes Relative 08/10/2014 24  12 - 46 % Final  . Lymphs Abs 08/10/2014 2.0  0.7 - 4.0 K/uL Final  . Monocytes Relative 08/10/2014 9  3 - 12 % Final  . Monocytes Absolute  08/10/2014 0.8  0.1 - 1.0 K/uL Final  . Eosinophils Relative 08/10/2014 2  0 - 5 % Final  . Eosinophils Absolute 08/10/2014 0.2  0.0 - 0.7 K/uL Final  . Basophils Relative 08/10/2014 1  0 - 1 % Final  . Basophils Absolute 08/10/2014 0.1  0.0 - 0.1 K/uL Final  . Smear Review 08/10/2014 Criteria for review not met   Final   Past Medical History  Diagnosis Date  . Diverticulitis   . Diabetes mellitus   . Hypertension   . High cholesterol   . Fatigue   . Obesity   . H/O hiatal hernia   . Goiter     cyst vs goiter on thyroid  . GERD (gastroesophageal reflux disease)     PAST HX--NO PROBLEMS NOW-PT DOES TAKE DAILY TUMS AT BEDTIME  . Abnormal finding on EKG 10/29/2012    FOLLOW UP NUCLEAR STRESS TEST ON 11/05/12 -NORMAL  . Corneal erosion 2012    RIGHT EYE--HAS RESOLVED--NO PROBLEMS SINCE  Past Surgical History  Procedure Laterality Date  . Abdominal hysterectomy    . Tonsillectomy    . US echocardiography  03/23/2006    EF 55-60%  . Cardiovascular stress test  06/08/2009    EF 78%, NO ISCHEMIA  . Breast lumpectomy      BENIGN  . Thyroid lobectomy  11/27/2012    Procedure: THYROID LOBECTOMY;  Surgeon: Ralene Ok, MD;  Location: [facility];  Service: General;  Laterality: Right;  Right Thyroid Lobectomy   Current Outpatient Prescriptions on File Prior to Visit  Medication Sig Dispense Refill  . aspirin 81 MG tablet Take 81 mg by mouth daily.       . Blood Glucose Monitoring Suppl (ACCU-CHEK AVIVA PLUS) W/DEVICE KIT Pt needs meter-strips(1 bottle =100/5 refills)-lancets(1 box/5 refills)  Checks BS qd DX: 250.00  1 kit  0  . Calcium Carbonate Antacid (TUMS PO) Take by mouth at bedtime. RARELY WOULD NEED ADDITIONAL TUMS DURING DAY IF SHE ATE SPICY FOODS      . fluticasone (FLONASE) 50 MCG/ACT nasal spray Place 2 sprays into both nostrils as needed.  16 g  11  . glipiZIDE (GLUCOTROL) 5 MG tablet TAKE 1 TABLET BY MOUTH TWICE DAILY BEFORE A MEAL  180 tablet  1  . levothyroxine (SYNTHROID,  LEVOTHROID) 75 MCG tablet TAKE ONE TABLET BY MOUTH ONCE DAILY BEFORE BREAKFAST  90 tablet  3  . lisinopril-hydrochlorothiazide (PRINZIDE,ZESTORETIC) 20-25 MG per tablet TAKE 1 TABLET BY MOUTH DAILY.  90 tablet  1  . loratadine (CLARITIN) 10 MG tablet TAKE 1 TABLET BY MOUTH DAILY  90 tablet  9  . LUNESTA 2 MG TABS tablet TAKE 1 TABLET AT BEDTIME  30 tablet  3  . metFORMIN (GLUCOPHAGE) 500 MG tablet TAKE 2 TABLETS BY MOUTH TWICE DAILY WITH A MEAL  360 tablet  1  . Multiple Vitamin (MULTIVITAMIN WITH MINERALS) TABS Take 1 tablet by mouth daily.      . simvastatin (ZOCOR) 80 MG tablet Take 0.5 tablets (40 mg total) by mouth at bedtime. Takes 1/2 tablet  90 tablet  1  . sitaGLIPtin (JANUVIA) 50 MG tablet Take 1 tablet (50 mg total) by mouth daily.  30 tablet  11  . tiZANidine (ZANAFLEX) 4 MG capsule Take 1 capsule (4 mg total) by mouth 3 (three) times daily as needed for muscle spasms.  30 capsule  0   No current facility-administered medications on file prior to visit.   Allergies  Allergen Reactions  . Clindamycin/Lincomycin     CLINDAMYCIN TAKEN WITH CIPRO CAUSED SEVERE NAUSEA--PT STATES SHE CAN TAKE CIPRO ALONE WITHOUT PROBLEM--IT WAS JUST THE COMBINATION SHE DID NOT TOLERATE.  Marland Kitchen Levaquin [Levofloxacin] Nausea And Vomiting  . Metronidazole Hcl Other (See Comments)    Deathly sick WHEN PT TOOK METRONIDAZOLE WITH CIPRO.  PT STATES SHE HAS TAKEN CIPRO ALONE WITHOUT PROBLEM --JUST DID NOT TOLERATE THE COMBINATION OF BOTH DRUGS TAKEN TOGETHER.   History   Social History  . Marital Status: Married    Spouse Name: N/A    Number of Children: N/A  . Years of Education: N/A   Occupational History  . Not on file.   Social History Main Topics  . Smoking status: Never Smoker   . Smokeless tobacco: Not on file  . Alcohol Use: No  . Drug Use: No  . Sexual Activity:    Other Topics Concern  . Not on file   Social History Narrative  . No narrative on file  No family history on  file. .  Review of Systems  All other systems reviewed and are negative.      Objective:   Physical Exam  Vitals reviewed. Constitutional: She is oriented to person, place, and time. She appears well-developed and well-nourished. No distress.  HENT:  Head: Normocephalic and atraumatic.  Right Ear: External ear normal.  Left Ear: External ear normal.  Nose: Nose normal.  Mouth/Throat: Oropharynx is clear and moist. No oropharyngeal exudate.  Eyes: Conjunctivae and EOM are normal. Pupils are equal, round, and reactive to light. Right eye exhibits no discharge. Left eye exhibits no discharge. No scleral icterus.  Neck: Normal range of motion. Neck supple. No JVD present. No tracheal deviation present. No thyromegaly present.  Cardiovascular: Normal rate, regular rhythm and intact distal pulses.  Exam reveals no gallop and no friction rub.   No murmur heard. Pulmonary/Chest: Effort normal and breath sounds normal. No stridor. No respiratory distress. She has no wheezes. She has no rales. She exhibits no tenderness.  Abdominal: Soft. Bowel sounds are normal. She exhibits no distension and no mass. There is no tenderness. There is no rebound and no guarding.  Musculoskeletal: Normal range of motion. She exhibits no edema and no tenderness.  Lymphadenopathy:    She has no cervical adenopathy.  Neurological: She is alert and oriented to person, place, and time. She has normal reflexes. She displays normal reflexes. No cranial nerve deficit. She exhibits normal muscle tone. Coordination normal.  Skin: Skin is warm. No rash noted. She is not diaphoretic. No erythema. No pallor.  Psychiatric: She has a normal mood and affect. Her behavior is normal. Judgment and thought content normal.          Assessment & Plan:  Routine general medical examination at a health care facility  discontinued Januvia and replaced with Jardiance 25 mg poqday.  Recheck blood sugars in 1 month. Patient's lab work  is excellent. I encouraged diet exercise and weight loss. Her blood pressure is extremely high today. Patient would like to check her blood pressure at home over the next 2-3 days and report to me the values because she believes this is a false positive. Her cancer screening is up-to-date. Her immunizations are up-to-date.

## 2014-08-21 ENCOUNTER — Other Ambulatory Visit: Payer: Self-pay | Admitting: Family Medicine

## 2014-08-24 NOTE — Telephone Encounter (Signed)
Refill appropriate and filled per protocol. 

## 2014-08-25 ENCOUNTER — Other Ambulatory Visit: Payer: Self-pay | Admitting: *Deleted

## 2014-08-25 MED ORDER — METFORMIN HCL 500 MG PO TABS: ORAL_TABLET | ORAL | Status: DC

## 2014-08-25 NOTE — Telephone Encounter (Signed)
Received fax requesting refill on Metformin.   Refill appropriate and filled per protocol. 

## 2014-09-11 ENCOUNTER — Telehealth: Payer: Self-pay | Admitting: Family Medicine

## 2014-09-11 MED ORDER — FLUCONAZOLE 150 MG PO TABS: 150.0000 mg | ORAL_TABLET | Freq: Once | ORAL | Status: DC

## 2014-09-11 NOTE — Telephone Encounter (Signed)
[phone]   PT is wanting to talk to you about medication that Dr Dennard Schaumann put her on and also talk to you about how it has gave her a yeast infection

## 2014-09-11 NOTE — Telephone Encounter (Signed)
Pt states that she has a yeast infection and has tried OTC with no help and was wondering if we could call in Diflucan for her - Per DR. Tatum ok to do and med sent to requested pharmacy.

## 2014-09-11 NOTE — Telephone Encounter (Signed)
Pt has been taking the Jardiance at 25mg  as prescribed and has not noticed much change in her BS - the lowest it has been has been 129 and that was just one day the rest of the time it has been higher. She also feels no different on this medication so if she needs to go back on the Januvia she will defiantly do that if you recommend it?

## 2014-09-14 NOTE — Telephone Encounter (Signed)
Please dc jardiance and switch to previous dose of [place].

## 2014-09-15 NOTE — Telephone Encounter (Signed)
Patient aware vis vm

## 2014-09-22 ENCOUNTER — Telehealth: Payer: Self-pay | Admitting: *Deleted

## 2014-09-22 ENCOUNTER — Other Ambulatory Visit: Payer: Self-pay | Admitting: Family Medicine

## 2014-09-22 DIAGNOSIS — E119 Type 2 diabetes mellitus without complications: Secondary | ICD-10-CM

## 2014-09-22 NOTE — Telephone Encounter (Signed)
-----   Message from [name] sent at 09/09/2014 12:13 PM EST ----- [facility]  Patient needs urine test for diabetic kidney disease.  Thank you [name]

## 2014-09-22 NOTE — Telephone Encounter (Signed)
Pt is coming in either Friday this week or Monday to have urine test Micro done. Pt is OOT at this time.

## 2014-09-25 ENCOUNTER — Telehealth: Payer: Self-pay | Admitting: Family Medicine

## 2014-09-25 NOTE — Telephone Encounter (Signed)
Pt states she thinks she has taken two 50 mg Januvias today instead of one.  What should she do??  I told her not to do anything differently.  Just be aware may get low blood sugar.  If feels that way be sure to check blood sugar and EAT something right away.  Would spot check sugar before dinner tonight and at bedtime.  The one extra dose if was taken will not hurt her just once.

## 2014-09-25 NOTE — Telephone Encounter (Signed)
She should be fine, that slight amount should not cause any problems

## 2014-09-28 ENCOUNTER — Other Ambulatory Visit: Payer: Medicare Other

## 2014-09-28 DIAGNOSIS — E119 Type 2 diabetes mellitus without complications: Secondary | ICD-10-CM

## 2014-09-29 LAB — MICROALBUMIN / CREATININE URINE RATIO
Creatinine, Urine: 71.5 mg/dL
Microalb Creat Ratio: 7 mg/g (ref 0.0–30.0)
Microalb, Ur: 0.5 mg/dL (ref <2.0)

## 2014-10-22 ENCOUNTER — Other Ambulatory Visit: Payer: Self-pay | Admitting: Family Medicine

## 2014-11-20 ENCOUNTER — Other Ambulatory Visit: Payer: Self-pay | Admitting: Family Medicine

## 2014-11-27 LAB — HM MAMMOGRAPHY

## 2014-12-23 ENCOUNTER — Other Ambulatory Visit: Payer: Self-pay | Admitting: Family Medicine

## 2014-12-23 ENCOUNTER — Encounter: Payer: Self-pay | Admitting: Family Medicine

## 2014-12-23 ENCOUNTER — Ambulatory Visit (INDEPENDENT_AMBULATORY_CARE_PROVIDER_SITE_OTHER): Payer: Medicare Other | Admitting: Family Medicine

## 2014-12-23 ENCOUNTER — Other Ambulatory Visit: Payer: Medicare Other

## 2014-12-23 VITALS — BP 140/76 | HR 78 | Temp 98.0°F | Resp 16

## 2014-12-23 DIAGNOSIS — M25475 Effusion, left foot: Secondary | ICD-10-CM

## 2014-12-23 DIAGNOSIS — E119 Type 2 diabetes mellitus without complications: Secondary | ICD-10-CM

## 2014-12-23 DIAGNOSIS — E785 Hyperlipidemia, unspecified: Secondary | ICD-10-CM

## 2014-12-23 DIAGNOSIS — E039 Hypothyroidism, unspecified: Secondary | ICD-10-CM

## 2014-12-23 DIAGNOSIS — Z79899 Other long term (current) drug therapy: Secondary | ICD-10-CM

## 2014-12-23 DIAGNOSIS — I10 Essential (primary) hypertension: Secondary | ICD-10-CM

## 2014-12-23 LAB — CBC WITH DIFFERENTIAL/PLATELET
Basophils Absolute: 0 10*3/uL (ref 0.0–0.1)
Basophils Relative: 0 % (ref 0–1)
Eosinophils Absolute: 0.2 10*3/uL (ref 0.0–0.7)
Eosinophils Relative: 2 % (ref 0–5)
HCT: 37.8 % (ref 36.0–46.0)
Hemoglobin: 12.4 g/dL (ref 12.0–15.0)
Lymphocytes Relative: 22 % (ref 12–46)
Lymphs Abs: 2 10*3/uL (ref 0.7–4.0)
MCH: 29.4 pg (ref 26.0–34.0)
MCHC: 32.8 g/dL (ref 30.0–36.0)
MCV: 89.6 fL (ref 78.0–100.0)
MPV: 9.4 fL (ref 8.6–12.4)
Monocytes Absolute: 0.8 10*3/uL (ref 0.1–1.0)
Monocytes Relative: 9 % (ref 3–12)
Neutro Abs: 6 10*3/uL (ref 1.7–7.7)
Neutrophils Relative %: 67 % (ref 43–77)
Platelets: 332 10*3/uL (ref 150–400)
RBC: 4.22 MIL/uL (ref 3.87–5.11)
RDW: 13.3 % (ref 11.5–15.5)
WBC: 8.9 10*3/uL (ref 4.0–10.5)

## 2014-12-23 LAB — LIPID PANEL
Cholesterol: 140 mg/dL (ref 0–200)
HDL: 35 mg/dL — ABNORMAL LOW (ref >=46)
LDL Cholesterol: 83 mg/dL (ref 0–99)
Total CHOL/HDL Ratio: 4 Ratio
Triglycerides: 109 mg/dL (ref <150)
VLDL: 22 mg/dL (ref 0–40)

## 2014-12-23 LAB — TSH: TSH: 4.565 u[IU]/mL — ABNORMAL HIGH (ref 0.350–4.500)

## 2014-12-23 LAB — HEMOGLOBIN A1C
Hgb A1c MFr Bld: 7.5 % — ABNORMAL HIGH (ref <5.7)
Mean Plasma Glucose: 169 mg/dL — ABNORMAL HIGH (ref <117)

## 2014-12-23 LAB — COMPREHENSIVE METABOLIC PANEL
ALT: 21 U/L (ref 0–35)
AST: 18 U/L (ref 0–37)
Albumin: 4 g/dL (ref 3.5–5.2)
Alkaline Phosphatase: 54 U/L (ref 39–117)
BUN: 18 mg/dL (ref 6–23)
CO2: 28 mEq/L (ref 19–32)
Calcium: 9.8 mg/dL (ref 8.4–10.5)
Chloride: 103 mEq/L (ref 96–112)
Creat: 1.07 mg/dL (ref 0.50–1.10)
Glucose, Bld: 179 mg/dL — ABNORMAL HIGH (ref 70–99)
Potassium: 5.2 mEq/L (ref 3.5–5.3)
Sodium: 141 mEq/L (ref 135–145)
Total Bilirubin: 0.3 mg/dL (ref 0.2–1.2)
Total Protein: 6.8 g/dL (ref 6.0–8.3)

## 2014-12-23 MED ORDER — FUROSEMIDE 20 MG PO TABS: 20.0000 mg | ORAL_TABLET | Freq: Every day | ORAL | Status: DC

## 2014-12-23 NOTE — Progress Notes (Signed)
Patient ID: Whitney Morales, female   DOB: 1940/11/22, 74 y.o.   MRN: [id number]   Subjective:    Patient ID: Whitney Morales, female    DOB: 1941-04-02, 74 y.o.   MRN: [id number]  Patient presents for Foot Swelling  patient here with injury to her left great toe. She states that she's been traveling and every time she travels she gets significant leg swelling. She also held her water pill when she traveled 2 weeks ago. Since then she has had some swelling mostly in the left foot she's not had any significant swelling in her legs or her calf. She was wearing a type of shoe that has caused rubbing and redness on her toe before. She denies any bruising or any injury to the toe was very sore at first but that is now resolved but she still has some swelling on top of the foot she was worried about. She has been taking her lisinopril HCTZ on a regular basis the past week.    Review Of Systems:  GEN- denies fatigue, fever, weight loss,weakness, recent illness HEENT- denies eye drainage, change in vision, nasal discharge, CVS- denies chest pain, palpitations RESP- denies SOB, cough, wheeze ABD- denies N/V, change in stools, abd pain GU- denies dysuria, hematuria, dribbling, incontinence MSK-+ joint pain, muscle aches, injury Neuro- denies headache, dizziness, syncope, seizure activity       Objective:    BP 140/76 mmHg  Pulse 78  Temp(Src) 98 F (36.7 C) (Oral)  Resp 16 GEN- NAD, alert and oriented x3 CVS- RRR, no murmur RESP-CTAB EXT- left pedal and mid foot edema, erythema on lateral edge of great toe, no warmth, FROM TOE, ANKLE, FOOT, foot/toe non tender Pulses- Radial, DP- 2+        Assessment & Plan:      Problem List Items Addressed This Visit    None    Visit Diagnoses    Swelling of foot joint, left    -  Primary     I think this is dependent edema from the trip and not taking diuretic, she does have erythema at the great toe, but no pain, less like likey gout, or OA.  will give lasix 20mg  once a day for the next 3 days, elevate foot which she has not been doing, no signs of DVT noted. No injury otherwise. Discussed using compression hose when she travels.       Note: This dictation was prepared with Dragon dictation along with smaller phrase technology. Any transcriptional errors that result from this process are unintentional.

## 2014-12-23 NOTE — Patient Instructions (Signed)
Try some  compression socks Use the lasix for 3 days Elevate the foot for next few days F/U as needed

## 2014-12-24 LAB — URIC ACID: Uric Acid, Serum: 7.9 mg/dL — ABNORMAL HIGH (ref 2.4–7.0)

## 2014-12-29 ENCOUNTER — Encounter: Payer: Self-pay | Admitting: Family Medicine

## 2014-12-29 ENCOUNTER — Ambulatory Visit (INDEPENDENT_AMBULATORY_CARE_PROVIDER_SITE_OTHER): Payer: Medicare Other | Admitting: Family Medicine

## 2014-12-29 VITALS — BP 140/70 | HR 78 | Temp 98.2°F | Resp 18 | Ht 63.0 in | Wt 227.0 lb

## 2014-12-29 DIAGNOSIS — M1000 Idiopathic gout, unspecified site: Secondary | ICD-10-CM | POA: Diagnosis not present

## 2014-12-29 DIAGNOSIS — E119 Type 2 diabetes mellitus without complications: Secondary | ICD-10-CM

## 2014-12-29 DIAGNOSIS — M109 Gout, unspecified: Secondary | ICD-10-CM

## 2014-12-29 MED ORDER — PIOGLITAZONE HCL 15 MG PO TABS: 15.0000 mg | ORAL_TABLET | Freq: Every day | ORAL | Status: DC

## 2014-12-29 MED ORDER — INDOMETHACIN 50 MG PO CAPS: 50.0000 mg | ORAL_CAPSULE | Freq: Three times a day (TID) | ORAL | Status: DC | PRN

## 2014-12-29 NOTE — Progress Notes (Signed)
Subjective:    Patient ID: Whitney Morales, female    DOB: 28-Nov-1940, 74 y.o.   MRN: [id number]  HPI   Patient is a very pleasant 69 year who is here today for complete physical exam. Her HgA1c remains elevated at 7.5.   The patient admits that she is not taking [place] regualrly.  She has tried and failed Invokana, Jardiance to yeast infections. She refuses tradjenta.  She is not exercising regularly. She recently had a gout exacerbation in her left great toe and the first MTP joint which still is tender and sore and erythematous. Fasting lab work shows excellent cholesterol. She denies any myalgias or right upper quadrant pain. Her blood pressure is adequately controlled at 120/70. She denies any chest pain shortness of breath or dyspnea on exertion. Appointment on 12/23/2014  Component Date Value Ref Range Status  . WBC 12/23/2014 8.9  4.0 - 10.5 K/uL Final  . RBC 12/23/2014 4.22  3.87 - 5.11 MIL/uL Final  . Hemoglobin 12/23/2014 12.4  12.0 - 15.0 g/dL Final  . HCT 12/23/2014 37.8  36.0 - 46.0 % Final  . MCV 12/23/2014 89.6  78.0 - 100.0 fL Final  . MCH 12/23/2014 29.4  26.0 - 34.0 pg Final  . MCHC 12/23/2014 32.8  30.0 - 36.0 g/dL Final  . RDW 12/23/2014 13.3  11.5 - 15.5 % Final  . Platelets 12/23/2014 332  150 - 400 K/uL Final  . MPV 12/23/2014 9.4  8.6 - 12.4 fL Final  . Neutrophils Relative % 12/23/2014 67  43 - 77 % Final  . Neutro Abs 12/23/2014 6.0  1.7 - 7.7 K/uL Final  . Lymphocytes Relative 12/23/2014 22  12 - 46 % Final  . Lymphs Abs 12/23/2014 2.0  0.7 - 4.0 K/uL Final  . Monocytes Relative 12/23/2014 9  3 - 12 % Final  . Monocytes Absolute 12/23/2014 0.8  0.1 - 1.0 K/uL Final  . Eosinophils Relative 12/23/2014 2  0 - 5 % Final  . Eosinophils Absolute 12/23/2014 0.2  0.0 - 0.7 K/uL Final  . Basophils Relative 12/23/2014 0  0 - 1 % Final  . Basophils Absolute 12/23/2014 0.0  0.0 - 0.1 K/uL Final  . Smear Review 12/23/2014 Criteria for review not met   Final  . Sodium  12/23/2014 141  135 - 145 mEq/L Final  . Potassium 12/23/2014 5.2  3.5 - 5.3 mEq/L Final  . Chloride 12/23/2014 103  96 - 112 mEq/L Final  . CO2 12/23/2014 28  19 - 32 mEq/L Final  . Glucose, Bld 12/23/2014 179* 70 - 99 mg/dL Final  . BUN 12/23/2014 18  6 - 23 mg/dL Final  . Creat 12/23/2014 1.07  0.50 - 1.10 mg/dL Final  . Total Bilirubin 12/23/2014 0.3  0.2 - 1.2 mg/dL Final  . Alkaline Phosphatase 12/23/2014 54  39 - 117 U/L Final  . AST 12/23/2014 18  0 - 37 U/L Final  . ALT 12/23/2014 21  0 - 35 U/L Final  . Total Protein 12/23/2014 6.8  6.0 - 8.3 g/dL Final  . Albumin 12/23/2014 4.0  3.5 - 5.2 g/dL Final  . Calcium 12/23/2014 9.8  8.4 - 10.5 mg/dL Final  . Hgb A1c MFr Bld 12/23/2014 7.5* <5.7 % Final   Comment:  According to the ADA Clinical Practice Recommendations for 2011, when HbA1c is used as a screening test:     >=6.5%   Diagnostic of Diabetes Mellitus            (if abnormal result is confirmed)   5.7-6.4%   Increased risk of developing Diabetes Mellitus   References:Diagnosis and Classification of Diabetes Mellitus,Diabetes [id number],66(AYTKZ 1):S62-S69 and Standards of Medical Care in         Diabetes - 2011,Diabetes SWFU,9323,55 (Suppl 1):S11-S61.     . Mean Plasma Glucose 12/23/2014 169* <117 mg/dL Final  . Cholesterol 12/23/2014 140  0 - 200 mg/dL Final   Comment: ATP III Classification:       < 200        mg/dL        Desirable      200 - 239     mg/dL        Borderline High      >= 240        mg/dL        High     . Triglycerides 12/23/2014 109  <150 mg/dL Final  . HDL 12/23/2014 35* >=46 mg/dL Final   ** Please note change in reference range(s). **  . Total CHOL/HDL Ratio 12/23/2014 4.0   Final  . VLDL 12/23/2014 22  0 - 40 mg/dL Final  . LDL Cholesterol 12/23/2014 83  0 - 99 mg/dL Final   Comment:   Total Cholesterol/HDL Ratio:CHD Risk                        Coronary Heart Disease  Risk Table                                        Men       Women          1/2 Average Risk              3.4        3.3              Average Risk              5.0        4.4           2X Average Risk              9.6        7.1           3X Average Risk             23.4       11.0 Use the calculated Patient Ratio above and the CHD Risk table  to determine the patient's CHD Risk. ATP III Classification (LDL):       < 100        mg/dL         Optimal      100 - 129     mg/dL         Near or Above Optimal      130 - 159     mg/dL         Borderline High      160 - 189     mg/dL         High       > 190  mg/dL         Very High     . TSH 12/23/2014 4.565* 0.350 - 4.500 uIU/mL Final  Orders Only on 12/23/2014  Component Date Value Ref Range Status  . Uric Acid, Serum 12/23/2014 7.9* 2.4 - 7.0 mg/dL Final   Past Medical History  Diagnosis Date  . Diverticulitis   . Diabetes mellitus   . Hypertension   . High cholesterol   . Fatigue   . Obesity   . H/O hiatal hernia   . Goiter     cyst vs goiter on thyroid  . GERD (gastroesophageal reflux disease)     PAST HX--NO PROBLEMS NOW-PT DOES TAKE DAILY TUMS AT BEDTIME  . Abnormal finding on EKG 10/29/2012    FOLLOW UP NUCLEAR STRESS TEST ON 11/05/12 -NORMAL  . Corneal erosion 2012    RIGHT EYE--HAS RESOLVED--NO PROBLEMS SINCE   Past Surgical History  Procedure Laterality Date  . Abdominal hysterectomy    . Tonsillectomy    . US echocardiography  03/23/2006    EF 55-60%  . Cardiovascular stress test  06/08/2009    EF 78%, NO ISCHEMIA  . Breast lumpectomy      BENIGN  . Thyroid lobectomy  11/27/2012    Procedure: THYROID LOBECTOMY;  Surgeon: Ralene Ok, MD;  Location: [facility];  Service: General;  Laterality: Right;  Right Thyroid Lobectomy   Current Outpatient Prescriptions on File Prior to Visit  Medication Sig Dispense Refill  . ACCU-CHEK AVIVA PLUS test strip TEST DAILY 100 each 11  . ACCU-CHEK SOFTCLIX LANCETS lancets USE  AS DIRECTED 100 each 5  . aspirin 81 MG tablet Take 81 mg by mouth daily.     . betamethasone dipropionate (DIPROLENE) 0.05 % cream Apply topically 2 (two) times daily.    . Blood Glucose Monitoring Suppl (ACCU-CHEK AVIVA PLUS) W/DEVICE KIT PT NEEDS METER-STRIPS(1 BOTTLE =100/5 REFILLS)-LANCETS(1 BOX/5 REFILLS) CHECKS BS QD DX: 250.00 1 kit 0  . Calcium Carbonate Antacid (TUMS PO) Take by mouth at bedtime. RARELY WOULD NEED ADDITIONAL TUMS DURING DAY IF SHE ATE SPICY FOODS    . fluticasone (FLONASE) 50 MCG/ACT nasal spray Place 2 sprays into both nostrils as needed. 16 g 11  . furosemide (LASIX) 20 MG tablet Take 1 tablet (20 mg total) by mouth daily. 7 tablet 0  . glipiZIDE (GLUCOTROL) 5 MG tablet TAKE 1 TABLET BY MOUTH TWICE DAILY BEFORE A MEAL 180 tablet 1  . levothyroxine (SYNTHROID, LEVOTHROID) 75 MCG tablet TAKE ONE TABLET BY MOUTH ONCE DAILY BEFORE BREAKFAST 90 tablet 3  . lisinopril-hydrochlorothiazide (PRINZIDE,ZESTORETIC) 20-25 MG per tablet TAKE 1 TABLET BY MOUTH DAILY. 90 tablet 1  . loratadine (CLARITIN) 10 MG tablet TAKE 1 TABLET BY MOUTH DAILY 90 tablet 9  . LUNESTA 2 MG TABS tablet TAKE 1 TABLET AT BEDTIME (Patient taking differently: TAKE 1 TABLET AT BEDTIME - Just when traveling) 30 tablet 3  . metFORMIN (GLUCOPHAGE) 500 MG tablet TAKE 2 TABLETS BY MOUTH TWICE DAILY WITH A MEAL 360 tablet 3  . Multiple Vitamin (MULTIVITAMIN WITH MINERALS) TABS Take 1 tablet by mouth daily.    . simvastatin (ZOCOR) 80 MG tablet Take 0.5 tablets (40 mg total) by mouth at bedtime. Takes 1/2 tablet 90 tablet 1  . sitaGLIPtin (JANUVIA) 50 MG tablet Take 1 tablet (50 mg total) by mouth daily. 30 tablet 11  . triamcinolone ointment (KENALOG) 0.1 % Apply 1 application topically 2 (two) times daily.     No current facility-administered medications  on file prior to visit.   Allergies  Allergen Reactions  . Clindamycin/Lincomycin     CLINDAMYCIN TAKEN WITH CIPRO CAUSED SEVERE NAUSEA--PT STATES SHE CAN  TAKE CIPRO ALONE WITHOUT PROBLEM--IT WAS JUST THE COMBINATION SHE DID NOT TOLERATE.  Marland Kitchen Levaquin [Levofloxacin] Nausea And Vomiting  . Metronidazole Hcl Other (See Comments)    Deathly sick WHEN PT TOOK METRONIDAZOLE WITH CIPRO.  PT STATES SHE HAS TAKEN CIPRO ALONE WITHOUT PROBLEM --JUST DID NOT TOLERATE THE COMBINATION OF BOTH DRUGS TAKEN TOGETHER.   History   Social History  . Marital Status: Married    Spouse Name: N/A  . Number of Children: N/A  . Years of Education: N/A   Occupational History  . Not on file.   Social History Main Topics  . Smoking status: Never Smoker   . Smokeless tobacco: Not on file  . Alcohol Use: No  . Drug Use: No  . Sexual Activity: Not on file   Other Topics Concern  . Not on file   Social History Narrative   No family history on file. .  Review of Systems  All other systems reviewed and are negative.      Objective:   Physical Exam  Constitutional: She is oriented to person, place, and time. She appears well-developed and well-nourished. No distress.  HENT:  Head: Normocephalic and atraumatic.  Right Ear: External ear normal.  Left Ear: External ear normal.  Nose: Nose normal.  Mouth/Throat: Oropharynx is clear and moist. No oropharyngeal exudate.  Eyes: Conjunctivae and EOM are normal. Pupils are equal, round, and reactive to light. Right eye exhibits no discharge. Left eye exhibits no discharge. No scleral icterus.  Neck: Normal range of motion. Neck supple. No JVD present. No tracheal deviation present. No thyromegaly present.  Cardiovascular: Normal rate, regular rhythm and intact distal pulses.  Exam reveals no gallop and no friction rub.   No murmur heard. Pulmonary/Chest: Effort normal and breath sounds normal. No stridor. No respiratory distress. She has no wheezes. She has no rales. She exhibits no tenderness.  Abdominal: Soft. Bowel sounds are normal. She exhibits no distension and no mass. There is no tenderness. There is no  rebound and no guarding.  Musculoskeletal: Normal range of motion. She exhibits no edema or tenderness.  Lymphadenopathy:    She has no cervical adenopathy.  Neurological: She is alert and oriented to person, place, and time. She has normal reflexes. No cranial nerve deficit. She exhibits normal muscle tone. Coordination normal.  Skin: Skin is warm. No rash noted. She is not diaphoretic. No erythema. No pallor.  Psychiatric: She has a normal mood and affect. Her behavior is normal. Judgment and thought content normal.  Vitals reviewed.         Assessment & Plan:  Podagra - Plan: indomethacin (INDOCIN) 50 MG capsule  Diabetes mellitus type II, controlled - Plan: pioglitazone (ACTOS) 15 MG tablet  begin indomethacin 50 mg by mouth 3 times a day as needed for pain in the left great toe. Discontinue medication was the pain resolves. If she begins to have frequent gout attacks we may need to start the patient on allopurinol. Her uric acid level was 7.9. Cholesterol is well controlled. Blood pressures well controlled. Diabetes is not. I recommended she increase Januvia to every day. Also recommended we start Actos 15 mg by mouth daily and recheck lab work in 3 months. I recommended aggressive therapeutic lifestyle changes as well.

## 2015-01-04 ENCOUNTER — Telehealth: Payer: Self-pay | Admitting: Family Medicine

## 2015-01-04 NOTE — Telephone Encounter (Signed)
[phone] PT was seen for gout the other day and she states its not better its still swollen and would like to speak to you about it

## 2015-01-05 NOTE — Telephone Encounter (Signed)
Per Dr. Dennard Schaumann stop indomethacin if no pain and stop lasix and use compressions stockings to control the swelling.  Per pt pain is better but still having some swelling and was wondering if [facility]P wanted to change meds?

## 2015-01-06 NOTE — Telephone Encounter (Signed)
Patient aware of recommendations.  

## 2015-01-11 LAB — HM DIABETES EYE EXAM

## 2015-01-12 ENCOUNTER — Other Ambulatory Visit: Payer: Self-pay | Admitting: Family Medicine

## 2015-01-13 NOTE — Telephone Encounter (Signed)
Medication refilled per protocol. 

## 2015-01-20 ENCOUNTER — Encounter: Payer: Self-pay | Admitting: Family Medicine

## 2015-01-26 ENCOUNTER — Other Ambulatory Visit: Payer: Self-pay | Admitting: Family Medicine

## 2015-01-26 NOTE — Telephone Encounter (Signed)
Medication refilled per protocol. 

## 2015-02-03 ENCOUNTER — Other Ambulatory Visit: Payer: Self-pay | Admitting: Family Medicine

## 2015-02-03 NOTE — Telephone Encounter (Signed)
Refill appropriate and filled per protocol. 

## 2015-02-22 ENCOUNTER — Ambulatory Visit (INDEPENDENT_AMBULATORY_CARE_PROVIDER_SITE_OTHER): Payer: Medicare Other | Admitting: Family Medicine

## 2015-02-22 ENCOUNTER — Encounter: Payer: Self-pay | Admitting: Family Medicine

## 2015-02-22 VITALS — BP 140/78 | HR 82 | Temp 98.0°F | Resp 18 | Ht 63.0 in | Wt 230.0 lb

## 2015-02-22 DIAGNOSIS — E119 Type 2 diabetes mellitus without complications: Secondary | ICD-10-CM | POA: Diagnosis not present

## 2015-02-22 MED ORDER — SIMVASTATIN 80 MG PO TABS: 40.0000 mg | ORAL_TABLET | Freq: Every day | ORAL | Status: DC

## 2015-02-22 NOTE — Progress Notes (Signed)
Subjective:    Patient ID: Whitney Morales, female    DOB: 06-30-1941, 74 y.o.   MRN: [id number]  HPI Please see my last office visit. At that time I recommended the patient begin taking Januvia 50 mg every day and also start Actos 15 mg by mouth daily to help control her diabetes.  She was already taking glipizide 5 mg a day, metformin 1000 mg by mouth twice a day.  Since making this change, the patient reports frequent episodes of hypoglycemia. She brings in fasting blood sugars for me to review the majority are 80-130 but she does have 5-6 days where she dropped into the 60s. This makes her feel terrible..  She reports feeling dizzy and lightheaded and weak and shaky. Symptoms improve if she drinks orange use.  Past Medical History  Diagnosis Date  . Diverticulitis   . Diabetes mellitus   . Hypertension   . High cholesterol   . Fatigue   . Obesity   . H/O hiatal hernia   . Goiter     cyst vs goiter on thyroid  . GERD (gastroesophageal reflux disease)     PAST HX--NO PROBLEMS NOW-PT DOES TAKE DAILY TUMS AT BEDTIME  . Abnormal finding on EKG 10/29/2012    FOLLOW UP NUCLEAR STRESS TEST ON 11/05/12 -NORMAL  . Corneal erosion 2012    RIGHT EYE--HAS RESOLVED--NO PROBLEMS SINCE   Past Surgical History  Procedure Laterality Date  . Abdominal hysterectomy    . Tonsillectomy    . US echocardiography  03/23/2006    EF 55-60%  . Cardiovascular stress test  06/08/2009    EF 78%, NO ISCHEMIA  . Breast lumpectomy      BENIGN  . Thyroid lobectomy  11/27/2012    Procedure: THYROID LOBECTOMY;  Surgeon: Ralene Ok, MD;  Location: [facility];  Service: General;  Laterality: Right;  Right Thyroid Lobectomy   Current Outpatient Prescriptions on File Prior to Visit  Medication Sig Dispense Refill  . ACCU-CHEK AVIVA PLUS test strip TEST DAILY 100 each 11  . ACCU-CHEK SOFTCLIX LANCETS lancets USE AS DIRECTED 100 each 5  . aspirin 81 MG tablet Take 81 mg by mouth daily.     . betamethasone  dipropionate (DIPROLENE) 0.05 % cream Apply topically 2 (two) times daily.    . Blood Glucose Monitoring Suppl (ACCU-CHEK AVIVA PLUS) W/DEVICE KIT PT NEEDS METER-STRIPS(1 BOTTLE =100/5 REFILLS)-LANCETS(1 BOX/5 REFILLS) CHECKS BS QD DX: 250.00 1 kit 0  . Calcium Carbonate Antacid (TUMS PO) Take by mouth at bedtime. RARELY WOULD NEED ADDITIONAL TUMS DURING DAY IF SHE ATE SPICY FOODS    . clotrimazole-betamethasone (LOTRISONE) cream APPLY TWICE DAILY FOR 10 DAYS 30 g 0  . fluticasone (FLONASE) 50 MCG/ACT nasal spray USE 2 SPRAYS IN EACH NOSTRIL DAILY 16 g 5  . furosemide (LASIX) 20 MG tablet Take 1 tablet (20 mg total) by mouth daily. 7 tablet 0  . glipiZIDE (GLUCOTROL) 5 MG tablet TAKE 1 TABLET BY MOUTH TWICE DAILY BEFORE A MEAL 180 tablet 1  . indomethacin (INDOCIN) 50 MG capsule Take 1 capsule (50 mg total) by mouth 3 (three) times daily as needed. 30 capsule 0  . JANUVIA 50 MG tablet TAKE 1 TABLET (50 MG TOTAL) BY MOUTH DAILY. 30 tablet 5  . levothyroxine (SYNTHROID, LEVOTHROID) 75 MCG tablet TAKE ONE TABLET BY MOUTH ONCE DAILY BEFORE BREAKFAST 90 tablet 3  . lisinopril-hydrochlorothiazide (PRINZIDE,ZESTORETIC) 20-25 MG per tablet TAKE 1 TABLET BY MOUTH DAILY 90 tablet 0  .  loratadine (CLARITIN) 10 MG tablet TAKE 1 TABLET BY MOUTH DAILY 90 tablet 9  . LUNESTA 2 MG TABS tablet TAKE 1 TABLET AT BEDTIME (Patient taking differently: TAKE 1 TABLET AT BEDTIME - Just when traveling) 30 tablet 3  . metFORMIN (GLUCOPHAGE) 500 MG tablet TAKE 2 TABLETS BY MOUTH TWICE DAILY WITH A MEAL 360 tablet 3  . Multiple Vitamin (MULTIVITAMIN WITH MINERALS) TABS Take 1 tablet by mouth daily.    . pioglitazone (ACTOS) 15 MG tablet Take 1 tablet (15 mg total) by mouth daily. 30 tablet 5  . triamcinolone ointment (KENALOG) 0.1 % Apply 1 application topically 2 (two) times daily.     No current facility-administered medications on file prior to visit.   Allergies  Allergen Reactions  . Clindamycin/Lincomycin      CLINDAMYCIN TAKEN WITH CIPRO CAUSED SEVERE NAUSEA--PT STATES SHE CAN TAKE CIPRO ALONE WITHOUT PROBLEM--IT WAS JUST THE COMBINATION SHE DID NOT TOLERATE.  Marland Kitchen Levaquin [Levofloxacin] Nausea And Vomiting  . Metronidazole Hcl Other (See Comments)    Deathly sick WHEN PT TOOK METRONIDAZOLE WITH CIPRO.  PT STATES SHE HAS TAKEN CIPRO ALONE WITHOUT PROBLEM --JUST DID NOT TOLERATE THE COMBINATION OF BOTH DRUGS TAKEN TOGETHER.   History   Social History  . Marital Status: Married    Spouse Name: N/A  . Number of Children: N/A  . Years of Education: N/A   Occupational History  . Not on file.   Social History Main Topics  . Smoking status: Never Smoker   . Smokeless tobacco: Not on file  . Alcohol Use: No  . Drug Use: No  . Sexual Activity: Not on file   Other Topics Concern  . Not on file   Social History Narrative      Review of Systems  All other systems reviewed and are negative.      Objective:   Physical Exam  Constitutional: She appears well-developed and well-nourished.  Cardiovascular: Normal rate, regular rhythm and normal heart sounds.   Pulmonary/Chest: Effort normal and breath sounds normal. No respiratory distress. She has no wheezes. She has no rales. She exhibits no tenderness.  Abdominal: Soft. Bowel sounds are normal.  Vitals reviewed.         Assessment & Plan:  Type 2 diabetes mellitus without complication  Discontinue glipizide. I believe it is the most likely offending agent causing hypoglycemic episodes. My goal fasting blood sugar for this patient will be 80-130 area not goal two-hour postprandial blood sugar will be 80-160. If her blood sugars are far outside the range I would add back half the dose of glipizide. She can call me on the telephone if necessary to adjust for high sugars.

## 2015-02-24 ENCOUNTER — Other Ambulatory Visit: Payer: Self-pay | Admitting: Family Medicine

## 2015-02-24 MED ORDER — SIMVASTATIN 80 MG PO TABS: 40.0000 mg | ORAL_TABLET | Freq: Every day | ORAL | Status: DC

## 2015-02-24 MED ORDER — METFORMIN HCL 500 MG PO TABS: ORAL_TABLET | ORAL | Status: DC

## 2015-03-03 ENCOUNTER — Other Ambulatory Visit: Payer: Self-pay | Admitting: Family Medicine

## 2015-03-03 NOTE — Telephone Encounter (Signed)
Refill appropriate and filled per protocol. 

## 2015-03-08 ENCOUNTER — Ambulatory Visit (INDEPENDENT_AMBULATORY_CARE_PROVIDER_SITE_OTHER): Payer: Medicare Other | Admitting: Family Medicine

## 2015-03-08 ENCOUNTER — Encounter: Payer: Self-pay | Admitting: Family Medicine

## 2015-03-08 VITALS — BP 132/74 | HR 84 | Temp 98.6°F | Resp 18 | Ht 63.0 in | Wt 226.0 lb

## 2015-03-08 DIAGNOSIS — K5732 Diverticulitis of large intestine without perforation or abscess without bleeding: Secondary | ICD-10-CM

## 2015-03-08 MED ORDER — AMOXICILLIN-POT CLAVULANATE 875-125 MG PO TABS: 1.0000 | ORAL_TABLET | Freq: Two times a day (BID) | ORAL | Status: DC

## 2015-03-08 NOTE — Progress Notes (Signed)
Subjective:    Patient ID: Whitney Morales, female    DOB: 05-Nov-1940, 74 y.o.   MRN: [id number]  HPI  Patient begin developing left lower quadrant abdominal pain last week. She hurts in her left lower quadrant today. Symptoms are consistent with previous episodes of diverticulitis she has had the past. She reports fatigue and subjective fevers. She denies any nausea or vomiting. She denies any dysuria. She denies any vaginal discharge or vaginal bleeding. She denies any hematuria. Today on exam she is tender to palpation of the left lower quadrant. In the past she has become ill with metronidazole. She cannot tolerate clindamycin. We have successfully treated diverticulitis with Augmentin in 2014 and that worked at that time Past Medical History  Diagnosis Date  . Diverticulitis   . Diabetes mellitus   . Hypertension   . High cholesterol   . Fatigue   . Obesity   . H/O hiatal hernia   . Goiter     cyst vs goiter on thyroid  . GERD (gastroesophageal reflux disease)     PAST HX--NO PROBLEMS NOW-PT DOES TAKE DAILY TUMS AT BEDTIME  . Abnormal finding on EKG 10/29/2012    FOLLOW UP NUCLEAR STRESS TEST ON 11/05/12 -NORMAL  . Corneal erosion 2012    RIGHT EYE--HAS RESOLVED--NO PROBLEMS SINCE   Past Surgical History  Procedure Laterality Date  . Abdominal hysterectomy    . Tonsillectomy    . US echocardiography  03/23/2006    EF 55-60%  . Cardiovascular stress test  06/08/2009    EF 78%, NO ISCHEMIA  . Breast lumpectomy      BENIGN  . Thyroid lobectomy  11/27/2012    Procedure: THYROID LOBECTOMY;  Surgeon: Ralene Ok, MD;  Location: [facility];  Service: General;  Laterality: Right;  Right Thyroid Lobectomy   Current Outpatient Prescriptions on File Prior to Visit  Medication Sig Dispense Refill  . ACCU-CHEK AVIVA PLUS test strip TEST DAILY 100 each 11  . ACCU-CHEK SOFTCLIX LANCETS lancets USE AS DIRECTED 100 each 5  . aspirin 81 MG tablet Take 81 mg by mouth daily.     . betamethasone  dipropionate (DIPROLENE) 0.05 % cream Apply topically 2 (two) times daily.    . Blood Glucose Monitoring Suppl (ACCU-CHEK AVIVA PLUS) W/DEVICE KIT PT NEEDS METER-STRIPS(1 BOTTLE =100/5 REFILLS)-LANCETS(1 BOX/5 REFILLS) CHECKS BS QD DX: 250.00 1 kit 0  . Calcium Carbonate Antacid (TUMS PO) Take by mouth at bedtime. RARELY WOULD NEED ADDITIONAL TUMS DURING DAY IF SHE ATE SPICY FOODS    . clotrimazole-betamethasone (LOTRISONE) cream APPLY TWICE DAILY FOR 10 DAYS 30 g 0  . fluticasone (FLONASE) 50 MCG/ACT nasal spray USE 2 SPRAYS IN EACH NOSTRIL DAILY 16 g 5  . furosemide (LASIX) 20 MG tablet Take 1 tablet (20 mg total) by mouth daily. 7 tablet 0  . indomethacin (INDOCIN) 50 MG capsule Take 1 capsule (50 mg total) by mouth 3 (three) times daily as needed. 30 capsule 0  . JANUVIA 50 MG tablet TAKE 1 TABLET (50 MG TOTAL) BY MOUTH DAILY. 30 tablet 5  . levothyroxine (SYNTHROID, LEVOTHROID) 75 MCG tablet TAKE ONE TABLET BY MOUTH ONCE DAILY BEFORE  BREAKFAST 90 tablet 0  . lisinopril-hydrochlorothiazide (PRINZIDE,ZESTORETIC) 20-25 MG per tablet TAKE 1 TABLET BY MOUTH DAILY 90 tablet 0  . loratadine (CLARITIN) 10 MG tablet TAKE 1 TABLET BY MOUTH DAILY 90 tablet 9  . LUNESTA 2 MG TABS tablet TAKE 1 TABLET AT BEDTIME (Patient taking differently: TAKE 1  TABLET AT BEDTIME - Just when traveling) 30 tablet 3  . metFORMIN (GLUCOPHAGE) 500 MG tablet TAKE 2 TABLETS BY MOUTH TWICE DAILY WITH A MEAL 360 tablet 3  . Multiple Vitamin (MULTIVITAMIN WITH MINERALS) TABS Take 1 tablet by mouth daily.    . pioglitazone (ACTOS) 15 MG tablet Take 1 tablet (15 mg total) by mouth daily. 30 tablet 5  . simvastatin (ZOCOR) 80 MG tablet Take 0.5 tablets (40 mg total) by mouth at bedtime. Takes 1/2 tablet 90 tablet 1  . triamcinolone ointment (KENALOG) 0.1 % Apply 1 application topically 2 (two) times daily.     No current facility-administered medications on file prior to visit.   Allergies  Allergen Reactions  .  Clindamycin/Lincomycin     CLINDAMYCIN TAKEN WITH CIPRO CAUSED SEVERE NAUSEA--PT STATES SHE CAN TAKE CIPRO ALONE WITHOUT PROBLEM--IT WAS JUST THE COMBINATION SHE DID NOT TOLERATE.  Marland Kitchen Levaquin [Levofloxacin] Nausea And Vomiting  . Metronidazole Hcl Other (See Comments)    Deathly sick WHEN PT TOOK METRONIDAZOLE WITH CIPRO.  PT STATES SHE HAS TAKEN CIPRO ALONE WITHOUT PROBLEM --JUST DID NOT TOLERATE THE COMBINATION OF BOTH DRUGS TAKEN TOGETHER.   History   Social History  . Marital Status: Married    Spouse Name: N/A  . Number of Children: N/A  . Years of Education: N/A   Occupational History  . Not on file.   Social History Main Topics  . Smoking status: Never Smoker   . Smokeless tobacco: Not on file  . Alcohol Use: No  . Drug Use: No  . Sexual Activity: Not on file   Other Topics Concern  . Not on file   Social History Narrative     Review of Systems  All other systems reviewed and are negative.      Objective:   Physical Exam  Constitutional: She appears well-developed and well-nourished.  Cardiovascular: Normal rate, regular rhythm and normal heart sounds.   No murmur heard. Pulmonary/Chest: Effort normal and breath sounds normal. No respiratory distress. She has no wheezes. She has no rales.  Abdominal: Soft. Bowel sounds are normal. She exhibits no distension. There is tenderness. There is no rebound.  Vitals reviewed.         Assessment & Plan:  Diverticulitis  Augmentin 875 mg by mouth twice a day for 10 days. Begin taking MiraLAX 3-4 times a day until having regular bowel movements. Recheck in 48 hours if no better or sooner if worse

## 2015-04-19 ENCOUNTER — Other Ambulatory Visit: Payer: Self-pay

## 2015-04-23 ENCOUNTER — Other Ambulatory Visit: Payer: Medicare Other

## 2015-04-23 DIAGNOSIS — Z79899 Other long term (current) drug therapy: Secondary | ICD-10-CM

## 2015-04-23 DIAGNOSIS — E039 Hypothyroidism, unspecified: Secondary | ICD-10-CM

## 2015-04-23 DIAGNOSIS — E785 Hyperlipidemia, unspecified: Secondary | ICD-10-CM

## 2015-04-23 DIAGNOSIS — E119 Type 2 diabetes mellitus without complications: Secondary | ICD-10-CM

## 2015-04-23 LAB — COMPLETE METABOLIC PANEL WITH GFR
ALT: 12 U/L (ref 0–35)
AST: 11 U/L (ref 0–37)
Albumin: 3.7 g/dL (ref 3.5–5.2)
Alkaline Phosphatase: 48 U/L (ref 39–117)
BUN: 18 mg/dL (ref 6–23)
CO2: 27 mEq/L (ref 19–32)
Calcium: 9.2 mg/dL (ref 8.4–10.5)
Chloride: 101 mEq/L (ref 96–112)
Creat: 1.04 mg/dL (ref 0.50–1.10)
GFR, Est African American: 62 mL/min
GFR, Est Non African American: 54 mL/min — ABNORMAL LOW
Glucose, Bld: 148 mg/dL — ABNORMAL HIGH (ref 70–99)
Potassium: 5.2 mEq/L (ref 3.5–5.3)
Sodium: 141 mEq/L (ref 135–145)
Total Bilirubin: 0.3 mg/dL (ref 0.2–1.2)
Total Protein: 6.4 g/dL (ref 6.0–8.3)

## 2015-04-23 LAB — LIPID PANEL
Cholesterol: 144 mg/dL (ref 0–200)
HDL: 35 mg/dL — ABNORMAL LOW (ref >=46)
LDL Cholesterol: 82 mg/dL (ref 0–99)
Total CHOL/HDL Ratio: 4.1 Ratio
Triglycerides: 135 mg/dL (ref <150)
VLDL: 27 mg/dL (ref 0–40)

## 2015-04-23 LAB — HEMOGLOBIN A1C
Hgb A1c MFr Bld: 7 % — ABNORMAL HIGH (ref <5.7)
Mean Plasma Glucose: 154 mg/dL — ABNORMAL HIGH (ref <117)

## 2015-04-23 LAB — TSH: TSH: 3.883 u[IU]/mL (ref 0.350–4.500)

## 2015-04-29 ENCOUNTER — Ambulatory Visit (INDEPENDENT_AMBULATORY_CARE_PROVIDER_SITE_OTHER): Payer: Medicare Other | Admitting: Family Medicine

## 2015-04-29 ENCOUNTER — Encounter: Payer: Self-pay | Admitting: Family Medicine

## 2015-04-29 VITALS — BP 130/60 | HR 84 | Temp 98.5°F | Resp 18 | Ht 63.0 in | Wt 232.0 lb

## 2015-04-29 DIAGNOSIS — E119 Type 2 diabetes mellitus without complications: Secondary | ICD-10-CM

## 2015-04-29 MED ORDER — SITAGLIPTIN PHOSPHATE 50 MG PO TABS: ORAL_TABLET | ORAL | Status: DC

## 2015-04-29 MED ORDER — PIOGLITAZONE HCL 15 MG PO TABS: 15.0000 mg | ORAL_TABLET | Freq: Every day | ORAL | Status: DC

## 2015-04-29 NOTE — Progress Notes (Signed)
Subjective:    Patient ID: Whitney Morales, female    DOB: [birth year], 74 y.o.   MRN: [id number]  HPI   Please see the last office visit in May. At that time we added Actos to help better manage her diabetes. Since then the patient's hemoglobin A1c has fallen from 7.5-7.0. She denies any side effects from Actos. She denies any edema in her legs. She denies any shortness of breath. She is not exercising regularly. The patient's blood pressure is well controlled 130/60. Her most recent lab work as listed below. Her cholesterol is well controlled. Her TSH is within the therapeutic range. Lab on 04/23/2015  Component Date Value Ref Range Status  . Sodium 04/23/2015 141  135 - 145 mEq/L Final  . Potassium 04/23/2015 5.2  3.5 - 5.3 mEq/L Final  . Chloride 04/23/2015 101  96 - 112 mEq/L Final  . CO2 04/23/2015 27  19 - 32 mEq/L Final  . Glucose, Bld 04/23/2015 148* 70 - 99 mg/dL Final  . BUN 04/23/2015 18  6 - 23 mg/dL Final  . Creat 04/23/2015 1.04  0.50 - 1.10 mg/dL Final  . Total Bilirubin 04/23/2015 0.3  0.2 - 1.2 mg/dL Final  . Alkaline Phosphatase 04/23/2015 48  39 - 117 U/L Final  . AST 04/23/2015 11  0 - 37 U/L Final  . ALT 04/23/2015 12  0 - 35 U/L Final  . Total Protein 04/23/2015 6.4  6.0 - 8.3 g/dL Final  . Albumin 04/23/2015 3.7  3.5 - 5.2 g/dL Final  . Calcium 04/23/2015 9.2  8.4 - 10.5 mg/dL Final  . GFR, Est African American 04/23/2015 62   Final  . GFR, Est Non African American 04/23/2015 54*  Final   Comment:   The estimated GFR is a calculation valid for adults (>=32 years old) that uses the CKD-EPI algorithm to adjust for age and sex. It is   not to be used for children, pregnant women, hospitalized patients,    patients on dialysis, or with rapidly changing kidney function. According to the NKDEP, eGFR >89 is normal, 60-89 shows mild impairment, 30-59 shows moderate impairment, 15-29 shows severe impairment and <15 is ESRD.     Marland Kitchen Cholesterol 04/23/2015 144  0 - 200  mg/dL Final   Comment: ATP III Classification:       < 200        mg/dL        Desirable      200 - 239     mg/dL        Borderline High      >= 240        mg/dL        High     . Triglycerides 04/23/2015 135  <150 mg/dL Final  . HDL 04/23/2015 35* >=46 mg/dL Final  . Total CHOL/HDL Ratio 04/23/2015 4.1   Final  . VLDL 04/23/2015 27  0 - 40 mg/dL Final  . LDL Cholesterol 04/23/2015 82  0 - 99 mg/dL Final   Comment:   Total Cholesterol/HDL Ratio:CHD Risk                        Coronary Heart Disease Risk Table                                        Men  Women          1/2 Average Risk              3.4        3.3              Average Risk              5.0        4.4           2X Average Risk              9.6        7.1           3X Average Risk             23.4       11.0 Use the calculated Patient Ratio above and the CHD Risk table  to determine the patient's CHD Risk. ATP III Classification (LDL):       < 100        mg/dL         Optimal      100 - 129     mg/dL         Near or Above Optimal      130 - 159     mg/dL         Borderline High      160 - 189     mg/dL         High       > 190        mg/dL         Very High     . Hgb A1c MFr Bld 04/23/2015 7.0* <5.7 % Final   Comment:                                                                        According to the ADA Clinical Practice Recommendations for 2011, when HbA1c is used as a screening test:     >=6.5%   Diagnostic of Diabetes Mellitus            (if abnormal result is confirmed)   5.7-6.4%   Increased risk of developing Diabetes Mellitus   References:Diagnosis and Classification of Diabetes Mellitus,Diabetes VEHM,0947,09(GGEZM 1):S62-S69 and Standards of Medical Care in         Diabetes - 2011,Diabetes OQHU,7654,65 (Suppl 1):S11-S61.     . Mean Plasma Glucose 04/23/2015 154* <117 mg/dL Final  . TSH 04/23/2015 3.883  0.350 - 4.500 uIU/mL Final    Past Medical History  Diagnosis Date  . Diverticulitis    . Diabetes mellitus   . Hypertension   . High cholesterol   . Fatigue   . Obesity   . H/O hiatal hernia   . Goiter     cyst vs goiter on thyroid  . GERD (gastroesophageal reflux disease)     PAST HX--NO PROBLEMS NOW-PT DOES TAKE DAILY TUMS AT BEDTIME  . Abnormal finding on EKG 10/29/2012    FOLLOW UP NUCLEAR STRESS TEST ON 11/05/12 -NORMAL  . Corneal erosion 2012    RIGHT EYE--HAS RESOLVED--NO PROBLEMS SINCE   Past Surgical History  Procedure Laterality Date  . Abdominal hysterectomy    .  Tonsillectomy    . US echocardiography  03/23/2006    EF 55-60%  . Cardiovascular stress test  06/08/2009    EF 78%, NO ISCHEMIA  . Breast lumpectomy      BENIGN  . Thyroid lobectomy  11/27/2012    Procedure: THYROID LOBECTOMY;  Surgeon: Ralene Ok, MD;  Location: [facility];  Service: General;  Laterality: Right;  Right Thyroid Lobectomy   Current Outpatient Prescriptions on File Prior to Visit  Medication Sig Dispense Refill  . ACCU-CHEK AVIVA PLUS test strip TEST DAILY 100 each 11  . ACCU-CHEK SOFTCLIX LANCETS lancets USE AS DIRECTED 100 each 5  . aspirin 81 MG tablet Take 81 mg by mouth daily.     . Blood Glucose Monitoring Suppl (ACCU-CHEK AVIVA PLUS) W/DEVICE KIT PT NEEDS METER-STRIPS(1 BOTTLE =100/5 REFILLS)-LANCETS(1 BOX/5 REFILLS) CHECKS BS QD DX: 250.00 1 kit 0  . Calcium Carbonate Antacid (TUMS PO) Take by mouth at bedtime. RARELY WOULD NEED ADDITIONAL TUMS DURING DAY IF SHE ATE SPICY FOODS    . levothyroxine (SYNTHROID, LEVOTHROID) 75 MCG tablet TAKE ONE TABLET BY MOUTH ONCE DAILY BEFORE  BREAKFAST 90 tablet 0  . lisinopril-hydrochlorothiazide (PRINZIDE,ZESTORETIC) 20-25 MG per tablet TAKE 1 TABLET BY MOUTH DAILY 90 tablet 0  . loratadine (CLARITIN) 10 MG tablet TAKE 1 TABLET BY MOUTH DAILY 90 tablet 9  . LUNESTA 2 MG TABS tablet TAKE 1 TABLET AT BEDTIME (Patient taking differently: TAKE 1 TABLET AT BEDTIME - Just when traveling) 30 tablet 3  . metFORMIN (GLUCOPHAGE) 500 MG tablet TAKE  2 TABLETS BY MOUTH TWICE DAILY WITH A MEAL 360 tablet 3  . Multiple Vitamin (MULTIVITAMIN WITH MINERALS) TABS Take 1 tablet by mouth daily.    . simvastatin (ZOCOR) 80 MG tablet Take 0.5 tablets (40 mg total) by mouth at bedtime. Takes 1/2 tablet 90 tablet 1  . triamcinolone ointment (KENALOG) 0.1 % Apply 1 application topically 2 (two) times daily.     No current facility-administered medications on file prior to visit.   Allergies  Allergen Reactions  . Clindamycin/Lincomycin     CLINDAMYCIN TAKEN WITH CIPRO CAUSED SEVERE NAUSEA--PT STATES SHE CAN TAKE CIPRO ALONE WITHOUT PROBLEM--IT WAS JUST THE COMBINATION SHE DID NOT TOLERATE.  Marland Kitchen Levaquin [Levofloxacin] Nausea And Vomiting  . Metronidazole Hcl Other (See Comments)    Deathly sick WHEN PT TOOK METRONIDAZOLE WITH CIPRO.  PT STATES SHE HAS TAKEN CIPRO ALONE WITHOUT PROBLEM --JUST DID NOT TOLERATE THE COMBINATION OF BOTH DRUGS TAKEN TOGETHER.   History   Social History  . Marital Status: Married    Spouse Name: N/A  . Number of Children: N/A  . Years of Education: N/A   Occupational History  . Not on file.   Social History Main Topics  . Smoking status: Never Smoker   . Smokeless tobacco: Not on file  . Alcohol Use: No  . Drug Use: No  . Sexual Activity: Not on file   Other Topics Concern  . Not on file   Social History Narrative     Review of Systems  All other systems reviewed and are negative.      Objective:   Physical Exam  Constitutional: She appears well-developed and well-nourished.  Cardiovascular: Normal rate, regular rhythm and normal heart sounds.   No murmur heard. Pulmonary/Chest: Effort normal and breath sounds normal. No respiratory distress. She has no wheezes. She has no rales.  Abdominal: Soft. Bowel sounds are normal. She exhibits no distension. There is no tenderness. There is  no rebound.  Vitals reviewed.         Assessment & Plan:  Diabetes mellitus type II, controlled - Plan:  pioglitazone (ACTOS) 15 MG tablet  Patient's blood sugar looks much better. She is almost at goal. I believe she can get her hemoglobin A1c to 6.5 if she will increase her aerobic exercise. I recommended 30 minutes of aerobic exercise every day 5 days a week. Also recommended a low carbohydrate diet. Otherwise her blood pressure is well controlled. Her cholesterol is well controlled. Her TSH is within therapeutic range. Recheck lab work in 6 months

## 2015-05-11 ENCOUNTER — Other Ambulatory Visit: Payer: Self-pay | Admitting: Family Medicine

## 2015-05-11 NOTE — Telephone Encounter (Signed)
Refill appropriate and filled per protocol. 

## 2015-06-03 ENCOUNTER — Other Ambulatory Visit: Payer: Self-pay | Admitting: Family Medicine

## 2015-06-03 NOTE — Telephone Encounter (Signed)
Refill appropriate and filled per protocol. 

## 2015-08-17 ENCOUNTER — Other Ambulatory Visit: Payer: Medicare Other

## 2015-08-17 DIAGNOSIS — E785 Hyperlipidemia, unspecified: Secondary | ICD-10-CM

## 2015-08-17 DIAGNOSIS — E119 Type 2 diabetes mellitus without complications: Secondary | ICD-10-CM

## 2015-08-17 DIAGNOSIS — I10 Essential (primary) hypertension: Secondary | ICD-10-CM

## 2015-08-17 DIAGNOSIS — E038 Other specified hypothyroidism: Secondary | ICD-10-CM

## 2015-08-17 LAB — COMPLETE METABOLIC PANEL WITH GFR
ALT: 15 U/L (ref 6–29)
AST: 14 U/L (ref 10–35)
Albumin: 4 g/dL (ref 3.6–5.1)
Alkaline Phosphatase: 58 U/L (ref 33–130)
BUN: 17 mg/dL (ref 7–25)
CO2: 27 mmol/L (ref 20–31)
Calcium: 10 mg/dL (ref 8.6–10.4)
Chloride: 101 mmol/L (ref 98–110)
Creat: 1.07 mg/dL — ABNORMAL HIGH (ref 0.60–0.93)
GFR, Est African American: 60 mL/min (ref >=60)
GFR, Est Non African American: 52 mL/min — ABNORMAL LOW (ref >=60)
Glucose, Bld: 194 mg/dL — ABNORMAL HIGH (ref 70–99)
Potassium: 5.1 mmol/L (ref 3.5–5.3)
Sodium: 140 mmol/L (ref 135–146)
Total Bilirubin: 0.3 mg/dL (ref 0.2–1.2)
Total Protein: 6.8 g/dL (ref 6.1–8.1)

## 2015-08-17 LAB — LIPID PANEL
Cholesterol: 152 mg/dL (ref 125–200)
HDL: 34 mg/dL — ABNORMAL LOW (ref >=46)
LDL Cholesterol: 89 mg/dL (ref <130)
Total CHOL/HDL Ratio: 4.5 Ratio (ref <=5.0)
Triglycerides: 146 mg/dL (ref <150)
VLDL: 29 mg/dL (ref <30)

## 2015-08-17 LAB — CBC WITH DIFFERENTIAL/PLATELET
Basophils Absolute: 0.1 10*3/uL (ref 0.0–0.1)
Basophils Relative: 1 % (ref 0–1)
Eosinophils Absolute: 0.2 10*3/uL (ref 0.0–0.7)
Eosinophils Relative: 2 % (ref 0–5)
HCT: 37.1 % (ref 36.0–46.0)
Hemoglobin: 12.3 g/dL (ref 12.0–15.0)
Lymphocytes Relative: 23 % (ref 12–46)
Lymphs Abs: 2 10*3/uL (ref 0.7–4.0)
MCH: 29.8 pg (ref 26.0–34.0)
MCHC: 33.2 g/dL (ref 30.0–36.0)
MCV: 89.8 fL (ref 78.0–100.0)
MPV: 9.4 fL (ref 8.6–12.4)
Monocytes Absolute: 0.8 10*3/uL (ref 0.1–1.0)
Monocytes Relative: 9 % (ref 3–12)
Neutro Abs: 5.7 10*3/uL (ref 1.7–7.7)
Neutrophils Relative %: 65 % (ref 43–77)
Platelets: 298 10*3/uL (ref 150–400)
RBC: 4.13 MIL/uL (ref 3.87–5.11)
RDW: 13.4 % (ref 11.5–15.5)
WBC: 8.7 10*3/uL (ref 4.0–10.5)

## 2015-08-17 LAB — TSH: TSH: 3.833 u[IU]/mL (ref 0.350–4.500)

## 2015-08-17 LAB — HEMOGLOBIN A1C
Hgb A1c MFr Bld: 7.4 % — ABNORMAL HIGH (ref <5.7)
Mean Plasma Glucose: 166 mg/dL — ABNORMAL HIGH (ref <117)

## 2015-08-19 ENCOUNTER — Ambulatory Visit (INDEPENDENT_AMBULATORY_CARE_PROVIDER_SITE_OTHER): Payer: Medicare Other | Admitting: Family Medicine

## 2015-08-19 ENCOUNTER — Encounter: Payer: Self-pay | Admitting: Family Medicine

## 2015-08-19 VITALS — BP 122/58 | HR 88 | Temp 98.7°F | Resp 16 | Ht 63.0 in | Wt 234.0 lb

## 2015-08-19 DIAGNOSIS — E038 Other specified hypothyroidism: Secondary | ICD-10-CM | POA: Diagnosis not present

## 2015-08-19 DIAGNOSIS — E785 Hyperlipidemia, unspecified: Secondary | ICD-10-CM | POA: Diagnosis not present

## 2015-08-19 DIAGNOSIS — E1121 Type 2 diabetes mellitus with diabetic nephropathy: Secondary | ICD-10-CM | POA: Diagnosis not present

## 2015-08-19 NOTE — Progress Notes (Signed)
Subjective:    Patient ID: Whitney Morales, female    DOB: [birth year], 74 y.o.   MRN: [id number]  HPI  04/29/15 Please see the last office visit in May. At that time we added Actos to help better manage her diabetes. Since then the patient's hemoglobin A1c has fallen from 7.5-7.0. She denies any side effects from Actos. She denies any edema in her legs. She denies any shortness of breath. She is not exercising regularly. The patient's blood pressure is well controlled 130/60. Her cholesterol is well controlled. Her TSH is within the therapeutic range.  At that time, my plan was: Patient's blood sugar looks much better. She is almost at goal. I believe she can get her hemoglobin A1c to 6.5 if she will increase her aerobic exercise. I recommended 30 minutes of aerobic exercise every day 5 days a week. Also recommended a low carbohydrate diet. Otherwise her blood pressure is well controlled. Her cholesterol is well controlled. Her TSH is within therapeutic range. Recheck lab work in 6 months.  08/19/15 Here for follow up.  Unfortunately, hemoglobin A1c has risen to 7.4. Patient admits that she has not been very active due to low back pain. She is not watching her diet. She is not losing weight. Her blood pressure today is well controlled, her cholesterol is acceptable, her thyroid test is normal. Her flu shot is up-to-date. Otherwise she is doing well Lab on 08/17/2015  Component Date Value Ref Range Status  . WBC 08/17/2015 8.7  4.0 - 10.5 K/uL Final  . RBC 08/17/2015 4.13  3.87 - 5.11 MIL/uL Final  . Hemoglobin 08/17/2015 12.3  12.0 - 15.0 g/dL Final  . HCT 08/17/2015 37.1  36.0 - 46.0 % Final  . MCV 08/17/2015 89.8  78.0 - 100.0 fL Final  . MCH 08/17/2015 29.8  26.0 - 34.0 pg Final  . MCHC 08/17/2015 33.2  30.0 - 36.0 g/dL Final  . RDW 08/17/2015 13.4  11.5 - 15.5 % Final  . Platelets 08/17/2015 298  150 - 400 K/uL Final  . MPV 08/17/2015 9.4  8.6 - 12.4 fL Final  . Neutrophils Relative %  08/17/2015 65  43 - 77 % Final  . Neutro Abs 08/17/2015 5.7  1.7 - 7.7 K/uL Final  . Lymphocytes Relative 08/17/2015 23  12 - 46 % Final  . Lymphs Abs 08/17/2015 2.0  0.7 - 4.0 K/uL Final  . Monocytes Relative 08/17/2015 9  3 - 12 % Final  . Monocytes Absolute 08/17/2015 0.8  0.1 - 1.0 K/uL Final  . Eosinophils Relative 08/17/2015 2  0 - 5 % Final  . Eosinophils Absolute 08/17/2015 0.2  0.0 - 0.7 K/uL Final  . Basophils Relative 08/17/2015 1  0 - 1 % Final  . Basophils Absolute 08/17/2015 0.1  0.0 - 0.1 K/uL Final  . Smear Review 08/17/2015 Criteria for review not met   Final  . Hgb A1c MFr Bld 08/17/2015 7.4* <5.7 % Final   Comment:                                                                        According to the ADA Clinical Practice Recommendations for 2011, when HbA1c is used as a screening  test:     >=6.5%   Diagnostic of Diabetes Mellitus            (if abnormal result is confirmed)   5.7-6.4%   Increased risk of developing Diabetes Mellitus   References:Diagnosis and Classification of Diabetes Mellitus,Diabetes AJOI,7867,67(MCNOB 1):S62-S69 and Standards of Medical Care in         Diabetes - 2011,Diabetes SJGG,8366,29 (Suppl 1):S11-S61.     . Mean Plasma Glucose 08/17/2015 166* <117 mg/dL Final  . Cholesterol 08/17/2015 152  125 - 200 mg/dL Final  . Triglycerides 08/17/2015 146  <150 mg/dL Final  . HDL 08/17/2015 34* >=46 mg/dL Final  . Total CHOL/HDL Ratio 08/17/2015 4.5  <=5.0 Ratio Final  . VLDL 08/17/2015 29  <30 mg/dL Final  . LDL Cholesterol 08/17/2015 89  <130 mg/dL Final   Comment:   Total Cholesterol/HDL Ratio:CHD Risk                        Coronary Heart Disease Risk Table                                        Men       Women          1/2 Average Risk              3.4        3.3              Average Risk              5.0        4.4           2X Average Risk              9.6        7.1           3X Average Risk             23.4       11.0 Use the  calculated Patient Ratio above and the CHD Risk table  to determine the patient's CHD Risk.   Marland Kitchen TSH 08/17/2015 3.833  0.350 - 4.500 uIU/mL Final  . Sodium 08/17/2015 140  135 - 146 mmol/L Final  . Potassium 08/17/2015 5.1  3.5 - 5.3 mmol/L Final  . Chloride 08/17/2015 101  98 - 110 mmol/L Final  . CO2 08/17/2015 27  20 - 31 mmol/L Final  . Glucose, Bld 08/17/2015 194* 70 - 99 mg/dL Final  . BUN 08/17/2015 17  7 - 25 mg/dL Final  . Creat 08/17/2015 1.07* 0.60 - 0.93 mg/dL Final  . Total Bilirubin 08/17/2015 0.3  0.2 - 1.2 mg/dL Final  . Alkaline Phosphatase 08/17/2015 58  33 - 130 U/L Final  . AST 08/17/2015 14  10 - 35 U/L Final  . ALT 08/17/2015 15  6 - 29 U/L Final  . Total Protein 08/17/2015 6.8  6.1 - 8.1 g/dL Final  . Albumin 08/17/2015 4.0  3.6 - 5.1 g/dL Final  . Calcium 08/17/2015 10.0  8.6 - 10.4 mg/dL Final  . GFR, Est African American 08/17/2015 60  >=60 mL/min Final  . GFR, Est Non African American 08/17/2015 52* >=60 mL/min Final   Comment:   The estimated GFR is a calculation valid for adults (>=94 years old) that uses the CKD-EPI algorithm to adjust for age  and sex. It is   not to be used for children, pregnant women, hospitalized patients,    patients on dialysis, or with rapidly changing kidney function. According to the NKDEP, eGFR >89 is normal, 60-89 shows mild impairment, 30-59 shows moderate impairment, 15-29 shows severe impairment and <15 is ESRD.       Past Medical History  Diagnosis Date  . Diverticulitis   . Diabetes mellitus   . Hypertension   . High cholesterol   . Fatigue   . Obesity   . H/O hiatal hernia   . Goiter     cyst vs goiter on thyroid  . GERD (gastroesophageal reflux disease)     PAST HX--NO PROBLEMS NOW-PT DOES TAKE DAILY TUMS AT BEDTIME  . Abnormal finding on EKG 10/29/2012    FOLLOW UP NUCLEAR STRESS TEST ON 11/05/12 -NORMAL  . Corneal erosion 2012    RIGHT EYE--HAS RESOLVED--NO PROBLEMS SINCE   Past Surgical History    Procedure Laterality Date  . Abdominal hysterectomy    . Tonsillectomy    . US echocardiography  03/23/2006    EF 55-60%  . Cardiovascular stress test  06/08/2009    EF 78%, NO ISCHEMIA  . Breast lumpectomy      BENIGN  . Thyroid lobectomy  11/27/2012    Procedure: THYROID LOBECTOMY;  Surgeon: Ralene Ok, MD;  Location: [facility];  Service: General;  Laterality: Right;  Right Thyroid Lobectomy   Current Outpatient Prescriptions on File Prior to Visit  Medication Sig Dispense Refill  . ACCU-CHEK AVIVA PLUS test strip TEST DAILY 100 each 11  . ACCU-CHEK SOFTCLIX LANCETS lancets USE AS DIRECTED 100 each 5  . aspirin 81 MG tablet Take 81 mg by mouth daily.     . Blood Glucose Monitoring Suppl (ACCU-CHEK AVIVA PLUS) W/DEVICE KIT PT NEEDS METER-STRIPS(1 BOTTLE =100/5 REFILLS)-LANCETS(1 BOX/5 REFILLS) CHECKS BS QD DX: 250.00 1 kit 0  . Calcium Carbonate Antacid (TUMS PO) Take by mouth at bedtime. RARELY WOULD NEED ADDITIONAL TUMS DURING DAY IF SHE ATE SPICY FOODS    . lisinopril-hydrochlorothiazide (PRINZIDE,ZESTORETIC) 20-25 MG per tablet TAKE 1 TABLET BY MOUTH DAILY 90 tablet 1  . loratadine (CLARITIN) 10 MG tablet TAKE 1 TABLET BY MOUTH DAILY 90 tablet 9  . LUNESTA 2 MG TABS tablet TAKE 1 TABLET AT BEDTIME (Patient taking differently: TAKE 1 TABLET AT BEDTIME - Just when traveling) 30 tablet 3  . metFORMIN (GLUCOPHAGE) 500 MG tablet TAKE 2 TABLETS BY MOUTH TWICE DAILY WITH A MEAL 360 tablet 3  . Multiple Vitamin (MULTIVITAMIN WITH MINERALS) TABS Take 1 tablet by mouth daily.    . pioglitazone (ACTOS) 15 MG tablet Take 1 tablet (15 mg total) by mouth daily. 90 tablet 3  . simvastatin (ZOCOR) 80 MG tablet Take 0.5 tablets (40 mg total) by mouth at bedtime. Takes 1/2 tablet 90 tablet 1  . sitaGLIPtin (JANUVIA) 50 MG tablet TAKE 1 TABLET (50 MG TOTAL) BY MOUTH DAILY. 90 tablet 3  . triamcinolone ointment (KENALOG) 0.1 % Apply 1 application topically 2 (two) times daily.    . [DISCONTINUED]  levothyroxine (SYNTHROID, LEVOTHROID) 75 MCG tablet TAKE ONE TABLET BY MOUTH ONCE DAILY BEFORE  BREAKFAST 90 tablet 0   No current facility-administered medications on file prior to visit.   Allergies  Allergen Reactions  . Clindamycin/Lincomycin     CLINDAMYCIN TAKEN WITH CIPRO CAUSED SEVERE NAUSEA--PT STATES SHE CAN TAKE CIPRO ALONE WITHOUT PROBLEM--IT WAS JUST THE COMBINATION SHE DID NOT TOLERATE.  Marland Kitchen  Levaquin [Levofloxacin] Nausea And Vomiting  . Metronidazole Hcl Other (See Comments)    Deathly sick WHEN PT TOOK METRONIDAZOLE WITH CIPRO.  PT STATES SHE HAS TAKEN CIPRO ALONE WITHOUT PROBLEM --JUST DID NOT TOLERATE THE COMBINATION OF BOTH DRUGS TAKEN TOGETHER.   Social History   Social History  . Marital Status: Married    Spouse Name: N/A  . Number of Children: N/A  . Years of Education: N/A   Occupational History  . Not on file.   Social History Main Topics  . Smoking status: Never Smoker   . Smokeless tobacco: Not on file  . Alcohol Use: No  . Drug Use: No  . Sexual Activity: Not on file   Other Topics Concern  . Not on file   Social History Narrative     Review of Systems  All other systems reviewed and are negative.      Objective:   Physical Exam  Constitutional: She appears well-developed and well-nourished.  Cardiovascular: Normal rate, regular rhythm and normal heart sounds.   No murmur heard. Pulmonary/Chest: Effort normal and breath sounds normal. No respiratory distress. She has no wheezes. She has no rales.  Abdominal: Soft. Bowel sounds are normal. She exhibits no distension. There is no tenderness. There is no rebound.  Vitals reviewed.         Assessment & Plan:  Controlled type 2 diabetes mellitus with diabetic nephropathy, without long-term current use of insulin (HCC)  HLD (hyperlipidemia)  Other specified hypothyroidism  I gave the patient the option of starting insulin versus 15 pounds of weight loss. The patient would like to try  15 pounds weight loss and therapeutic lifestyle changes. Cholesterol and thyroid tests are within normal limits

## 2015-08-20 ENCOUNTER — Other Ambulatory Visit: Payer: Self-pay | Admitting: Family Medicine

## 2015-08-20 NOTE — Telephone Encounter (Signed)
Refill appropriate and filled per protocol. 

## 2015-11-10 ENCOUNTER — Other Ambulatory Visit: Payer: Self-pay | Admitting: Family Medicine

## 2015-11-10 NOTE — Telephone Encounter (Signed)
Refill appropriate and filled per protocol. 

## 2015-12-10 ENCOUNTER — Ambulatory Visit (INDEPENDENT_AMBULATORY_CARE_PROVIDER_SITE_OTHER): Payer: Medicare Other | Admitting: Family Medicine

## 2015-12-10 ENCOUNTER — Encounter: Payer: Self-pay | Admitting: Family Medicine

## 2015-12-10 VITALS — BP 140/62 | HR 84 | Temp 98.1°F | Resp 16 | Ht 63.0 in

## 2015-12-10 DIAGNOSIS — M109 Gout, unspecified: Secondary | ICD-10-CM

## 2015-12-10 DIAGNOSIS — M1000 Idiopathic gout, unspecified site: Secondary | ICD-10-CM

## 2015-12-10 MED ORDER — COLCHICINE 0.6 MG PO TABS: 0.6000 mg | ORAL_TABLET | Freq: Every day | ORAL | Status: DC

## 2015-12-10 NOTE — Progress Notes (Signed)
Subjective:    Patient ID: Whitney Morales, female    DOB: Oct 21, 1941, 75 y.o.   MRN: [id number]  HPI    Patient reports one week of severe pain in her right first MTP joint. The joint is erythematous swollen painful and warm. It hurts to touch the skin. She has a history of gout Past Medical History  Diagnosis Date  . Diverticulitis   . Diabetes mellitus   . Hypertension   . High cholesterol   . Fatigue   . Obesity   . H/O hiatal hernia   . Goiter     cyst vs goiter on thyroid  . GERD (gastroesophageal reflux disease)     PAST HX--NO PROBLEMS NOW-PT DOES TAKE DAILY TUMS AT BEDTIME  . Abnormal finding on EKG 10/29/2012    FOLLOW UP NUCLEAR STRESS TEST ON 11/05/12 -NORMAL  . Corneal erosion 2012    RIGHT EYE--HAS RESOLVED--NO PROBLEMS SINCE   Past Surgical History  Procedure Laterality Date  . Abdominal hysterectomy    . Tonsillectomy    . US echocardiography  03/23/2006    EF 55-60%  . Cardiovascular stress test  06/08/2009    EF 78%, NO ISCHEMIA  . Breast lumpectomy      BENIGN  . Thyroid lobectomy  11/27/2012    Procedure: THYROID LOBECTOMY;  Surgeon: Ralene Ok, MD;  Location: [facility];  Service: General;  Laterality: Right;  Right Thyroid Lobectomy   Current Outpatient Prescriptions on File Prior to Visit  Medication Sig Dispense Refill  . ACCU-CHEK AVIVA PLUS test strip TEST DAILY 100 each 11  . ACCU-CHEK SOFTCLIX LANCETS lancets USE AS DIRECTED 100 each 5  . aspirin 81 MG tablet Take 81 mg by mouth daily.     . Blood Glucose Monitoring Suppl (ACCU-CHEK AVIVA PLUS) W/DEVICE KIT PT NEEDS METER-STRIPS(1 BOTTLE =100/5 REFILLS)-LANCETS(1 BOX/5 REFILLS) CHECKS BS QD DX: 250.00 1 kit 0  . Calcium Carbonate Antacid (TUMS PO) Take by mouth at bedtime. RARELY WOULD NEED ADDITIONAL TUMS DURING DAY IF SHE ATE SPICY FOODS    . lisinopril-hydrochlorothiazide (PRINZIDE,ZESTORETIC) 20-25 MG tablet TAKE 1 TABLET BY MOUTH DAILY 90 tablet 0  . loratadine (CLARITIN) 10 MG tablet TAKE  1 TABLET BY MOUTH DAILY 90 tablet 9  . LUNESTA 2 MG TABS tablet TAKE 1 TABLET AT BEDTIME (Patient taking differently: TAKE 1 TABLET AT BEDTIME - Just when traveling) 30 tablet 3  . metFORMIN (GLUCOPHAGE) 500 MG tablet TAKE 2 TABLETS BY MOUTH TWICE DAILY WITH A MEAL 360 tablet 2  . Multiple Vitamin (MULTIVITAMIN WITH MINERALS) TABS Take 1 tablet by mouth daily.    . pioglitazone (ACTOS) 15 MG tablet Take 1 tablet (15 mg total) by mouth daily. 90 tablet 3  . simvastatin (ZOCOR) 80 MG tablet Take 0.5 tablets (40 mg total) by mouth at bedtime. Takes 1/2 tablet 90 tablet 1  . sitaGLIPtin (JANUVIA) 50 MG tablet TAKE 1 TABLET (50 MG TOTAL) BY MOUTH DAILY. 90 tablet 3  . triamcinolone ointment (KENALOG) 0.1 % Apply 1 application topically 2 (two) times daily.    . [DISCONTINUED] levothyroxine (SYNTHROID, LEVOTHROID) 75 MCG tablet TAKE ONE TABLET BY MOUTH ONCE DAILY BEFORE  BREAKFAST 90 tablet 0   No current facility-administered medications on file prior to visit.   Allergies  Allergen Reactions  . Clindamycin/Lincomycin     CLINDAMYCIN TAKEN WITH CIPRO CAUSED SEVERE NAUSEA--PT STATES SHE CAN TAKE CIPRO ALONE WITHOUT PROBLEM--IT WAS JUST THE COMBINATION SHE DID NOT TOLERATE.  Marland Kitchen Levaquin [  Levofloxacin] Nausea And Vomiting  . Metronidazole Hcl Other (See Comments)    Deathly sick WHEN PT TOOK METRONIDAZOLE WITH CIPRO.  PT STATES SHE HAS TAKEN CIPRO ALONE WITHOUT PROBLEM --JUST DID NOT TOLERATE THE COMBINATION OF BOTH DRUGS TAKEN TOGETHER.   Social History   Social History  . Marital Status: Married    Spouse Name: N/A  . Number of Children: N/A  . Years of Education: N/A   Occupational History  . Not on file.   Social History Main Topics  . Smoking status: Never Smoker   . Smokeless tobacco: Not on file  . Alcohol Use: No  . Drug Use: No  . Sexual Activity: Not on file   Other Topics Concern  . Not on file   Social History Narrative   No family history on file. .  Review of  Systems  All other systems reviewed and are negative.      Objective:   Physical Exam  Constitutional: She is oriented to person, place, and time. She appears well-developed and well-nourished. No distress.  HENT:  Head: Normocephalic and atraumatic.  Right Ear: External ear normal.  Left Ear: External ear normal.  Nose: Nose normal.  Mouth/Throat: Oropharynx is clear and moist. No oropharyngeal exudate.  Eyes: Conjunctivae and EOM are normal. Pupils are equal, round, and reactive to light. Right eye exhibits no discharge. Left eye exhibits no discharge. No scleral icterus.  Neck: Normal range of motion. Neck supple. No JVD present. No tracheal deviation present. No thyromegaly present.  Cardiovascular: Normal rate, regular rhythm and intact distal pulses.  Exam reveals no gallop and no friction rub.   No murmur heard. Pulmonary/Chest: Effort normal and breath sounds normal. No stridor. No respiratory distress. She has no wheezes. She has no rales. She exhibits no tenderness.  Abdominal: Soft. Bowel sounds are normal. She exhibits no distension and no mass. There is no tenderness. There is no rebound and no guarding.  Musculoskeletal: Normal range of motion. She exhibits no edema or tenderness.  Lymphadenopathy:    She has no cervical adenopathy.  Neurological: She is alert and oriented to person, place, and time. She has normal reflexes. No cranial nerve deficit. She exhibits normal muscle tone. Coordination normal.  Skin: Skin is warm. No rash noted. She is not diaphoretic. No erythema. No pallor.  Psychiatric: She has a normal mood and affect. Her behavior is normal. Judgment and thought content normal.  Vitals reviewed.         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 She can repeat this process on a daily basis until pain improves. We discussed starting the patient on  allopurinol as a preventative or stopping hydrochlorothiazide. However at the present time she elects to make no changes to prevent this. These attacks are occurring infrequently. At the present time she prefers just to treat the attack.

## 2015-12-16 ENCOUNTER — Telehealth: Payer: Self-pay | Admitting: Family Medicine

## 2015-12-16 NOTE — Telephone Encounter (Signed)
Questions answered for pt per LOV note

## 2015-12-16 NOTE — Telephone Encounter (Signed)
Patient would like to talk to you regarding her gout  [phone]

## 2015-12-28 ENCOUNTER — Other Ambulatory Visit: Payer: Medicare Other

## 2015-12-28 DIAGNOSIS — E1121 Type 2 diabetes mellitus with diabetic nephropathy: Secondary | ICD-10-CM

## 2015-12-28 DIAGNOSIS — Z79899 Other long term (current) drug therapy: Secondary | ICD-10-CM

## 2015-12-28 DIAGNOSIS — E785 Hyperlipidemia, unspecified: Secondary | ICD-10-CM

## 2015-12-28 LAB — LIPID PANEL
Cholesterol: 142 mg/dL (ref 125–200)
HDL: 34 mg/dL — ABNORMAL LOW (ref >=46)
LDL Cholesterol: 76 mg/dL (ref <130)
Total CHOL/HDL Ratio: 4.2 Ratio (ref <=5.0)
Triglycerides: 158 mg/dL — ABNORMAL HIGH (ref <150)
VLDL: 32 mg/dL — ABNORMAL HIGH (ref <30)

## 2015-12-28 LAB — COMPLETE METABOLIC PANEL WITH GFR
ALT: 14 U/L (ref 6–29)
AST: 15 U/L (ref 10–35)
Albumin: 4.2 g/dL (ref 3.6–5.1)
Alkaline Phosphatase: 46 U/L (ref 33–130)
BUN: 16 mg/dL (ref 7–25)
CO2: 28 mmol/L (ref 20–31)
Calcium: 9.8 mg/dL (ref 8.6–10.4)
Chloride: 101 mmol/L (ref 98–110)
Creat: 1.13 mg/dL — ABNORMAL HIGH (ref 0.60–0.93)
GFR, Est African American: 56 mL/min — ABNORMAL LOW (ref >=60)
GFR, Est Non African American: 49 mL/min — ABNORMAL LOW (ref >=60)
Glucose, Bld: 161 mg/dL — ABNORMAL HIGH (ref 70–99)
Potassium: 5.3 mmol/L (ref 3.5–5.3)
Sodium: 137 mmol/L (ref 135–146)
Total Bilirubin: 0.3 mg/dL (ref 0.2–1.2)
Total Protein: 7.1 g/dL (ref 6.1–8.1)

## 2015-12-29 LAB — HEMOGLOBIN A1C
Hgb A1c MFr Bld: 7.5 % — ABNORMAL HIGH (ref <5.7)
Mean Plasma Glucose: 169 mg/dL — ABNORMAL HIGH (ref <117)

## 2015-12-31 ENCOUNTER — Ambulatory Visit (INDEPENDENT_AMBULATORY_CARE_PROVIDER_SITE_OTHER): Payer: Medicare Other | Admitting: Family Medicine

## 2015-12-31 ENCOUNTER — Encounter: Payer: Self-pay | Admitting: Family Medicine

## 2015-12-31 VITALS — BP 132/76 | HR 74 | Temp 98.2°F | Resp 18 | Ht 63.0 in | Wt 235.0 lb

## 2015-12-31 DIAGNOSIS — I10 Essential (primary) hypertension: Secondary | ICD-10-CM | POA: Diagnosis not present

## 2015-12-31 DIAGNOSIS — E1121 Type 2 diabetes mellitus with diabetic nephropathy: Secondary | ICD-10-CM | POA: Diagnosis not present

## 2015-12-31 DIAGNOSIS — E785 Hyperlipidemia, unspecified: Secondary | ICD-10-CM | POA: Diagnosis not present

## 2015-12-31 NOTE — Progress Notes (Signed)
Subjective:    Patient ID: Whitney Morales, female    DOB: September 27, 1941, 75 y.o.   MRN: [id number]  HPI Wt Readings from Last 3 Encounters:  12/31/15 235 lb (106.595 kg)  08/19/15 234 lb (106.142 kg)  04/29/15 232 lb (105.235 kg)      Review of Systems     Objective:   Physical Exam        Assessment & Plan:     Subjective:    Patient ID: Whitney Morales, female    DOB: 07-31-1941, 75 y.o.   MRN: [id number]  HPI  04/29/15 Please see the last office visit in May. At that time we added Actos to help better manage her diabetes. Since then the patient's hemoglobin A1c has fallen from 7.5-7.0. She denies any side effects from Actos. She denies any edema in her legs. She denies any shortness of breath. She is not exercising regularly. The patient's blood pressure is well controlled 130/60. Her cholesterol is well controlled. Her TSH is within the therapeutic range.  At that time, my plan was: Patient's blood sugar looks much better. She is almost at goal. I believe she can get her hemoglobin A1c to 6.5 if she will increase her aerobic exercise. I recommended 30 minutes of aerobic exercise every day 5 days a week. Also recommended a low carbohydrate diet. Otherwise her blood pressure is well controlled. Her cholesterol is well controlled. Her TSH is within therapeutic range. Recheck lab work in 6 months.  08/19/15 Here for follow up.  Unfortunately, hemoglobin A1c has risen to 7.4. Patient admits that she has not been very active due to low back pain. She is not watching her diet. She is not losing weight. Her blood pressure today is well controlled, her cholesterol is acceptable, her thyroid test is normal. Her flu shot is up-to-date. Otherwise she is doing well  12/31/15 She is here today for follow-up. She is currently using metformin 1000 mg by mouth twice a day, Januvia 50 mg by mouth daily, and Actos 15 mg by mouth daily to control her diabetes. Her blood pressure is well  controlled. She denies any chest pain shortness of breath or dyspnea on exertion. She denies any myalgias or right upper quadrant pain. She denies any polyuria, polydipsia, or blurred vision. She denies any neuropathy. She denies any hypoglycemic episodes. Lab on 12/28/2015  Component Date Value Ref Range Status  . Cholesterol 12/28/2015 142  125 - 200 mg/dL Final  . Triglycerides 12/28/2015 158* <150 mg/dL Final  . HDL 12/28/2015 34* >=46 mg/dL Final  . Total CHOL/HDL Ratio 12/28/2015 4.2  <=5.0 Ratio Final  . VLDL 12/28/2015 32* <30 mg/dL Final  . LDL Cholesterol 12/28/2015 76  <130 mg/dL Final   Comment:   Total Cholesterol/HDL Ratio:CHD Risk                        Coronary Heart Disease Risk Table                                        Men       Women          1/2 Average Risk              3.4        3.3  Average Risk              5.0        4.4           2X Average Risk              9.6        7.1           3X Average Risk             23.4       11.0 Use the calculated Patient Ratio above and the CHD Risk table  to determine the patient's CHD Risk.   . Sodium 12/28/2015 137  135 - 146 mmol/L Final  . Potassium 12/28/2015 5.3  3.5 - 5.3 mmol/L Final  . Chloride 12/28/2015 101  98 - 110 mmol/L Final  . CO2 12/28/2015 28  20 - 31 mmol/L Final  . Glucose, Bld 12/28/2015 161* 70 - 99 mg/dL Final  . BUN 12/28/2015 16  7 - 25 mg/dL Final  . Creat 12/28/2015 1.13* 0.60 - 0.93 mg/dL Final  . Total Bilirubin 12/28/2015 0.3  0.2 - 1.2 mg/dL Final  . Alkaline Phosphatase 12/28/2015 46  33 - 130 U/L Final  . AST 12/28/2015 15  10 - 35 U/L Final  . ALT 12/28/2015 14  6 - 29 U/L Final  . Total Protein 12/28/2015 7.1  6.1 - 8.1 g/dL Final  . Albumin 12/28/2015 4.2  3.6 - 5.1 g/dL Final  . Calcium 12/28/2015 9.8  8.6 - 10.4 mg/dL Final  . GFR, Est African American 12/28/2015 56* >=60 mL/min Final  . GFR, Est Non African American 12/28/2015 49* >=60 mL/min Final   Comment:   The  estimated GFR is a calculation valid for adults (>=48 years old) that uses the CKD-EPI algorithm to adjust for age and sex. It is   not to be used for children, pregnant women, hospitalized patients,    patients on dialysis, or with rapidly changing kidney function. According to the NKDEP, eGFR >89 is normal, 60-89 shows mild impairment, 30-59 shows moderate impairment, 15-29 shows severe impairment and <15 is ESRD.     Marland Kitchen Hgb A1c MFr Bld 12/28/2015 7.5* <5.7 % Final   Comment:                                                                        According to the ADA Clinical Practice Recommendations for 2011, when HbA1c is used as a screening test:     >=6.5%   Diagnostic of Diabetes Mellitus            (if abnormal result is confirmed)   5.7-6.4%   Increased risk of developing Diabetes Mellitus   References:Diagnosis and Classification of Diabetes Mellitus,Diabetes ZDGL,8756,43(PIRJJ 1):S62-S69 and Standards of Medical Care in         Diabetes - 2011,Diabetes OACZ,6606,30 (Suppl 1):S11-S61.     . Mean Plasma Glucose 12/28/2015 169* <117 mg/dL Final    Past Medical History  Diagnosis Date  . Diverticulitis   . Diabetes mellitus   . Hypertension   . High cholesterol   . Fatigue   . Obesity   . H/O hiatal hernia   . Goiter  cyst vs goiter on thyroid  . GERD (gastroesophageal reflux disease)     PAST HX--NO PROBLEMS NOW-PT DOES TAKE DAILY TUMS AT BEDTIME  . Abnormal finding on EKG 10/29/2012    FOLLOW UP NUCLEAR STRESS TEST ON 11/05/12 -NORMAL  . Corneal erosion 2012    RIGHT EYE--HAS RESOLVED--NO PROBLEMS SINCE   Past Surgical History  Procedure Laterality Date  . Abdominal hysterectomy    . Tonsillectomy    . US echocardiography  03/23/2006    EF 55-60%  . Cardiovascular stress test  06/08/2009    EF 78%, NO ISCHEMIA  . Breast lumpectomy      BENIGN  . Thyroid lobectomy  11/27/2012    Procedure: THYROID LOBECTOMY;  Surgeon: Ralene Ok, MD;  Location: [facility];   Service: General;  Laterality: Right;  Right Thyroid Lobectomy   Current Outpatient Prescriptions on File Prior to Visit  Medication Sig Dispense Refill  . ACCU-CHEK AVIVA PLUS test strip TEST DAILY 100 each 11  . ACCU-CHEK SOFTCLIX LANCETS lancets USE AS DIRECTED 100 each 5  . aspirin 81 MG tablet Take 81 mg by mouth daily.     . Blood Glucose Monitoring Suppl (ACCU-CHEK AVIVA PLUS) W/DEVICE KIT PT NEEDS METER-STRIPS(1 BOTTLE =100/5 REFILLS)-LANCETS(1 BOX/5 REFILLS) CHECKS BS QD DX: 250.00 1 kit 0  . Calcium Carbonate Antacid (TUMS PO) Take by mouth at bedtime. RARELY WOULD NEED ADDITIONAL TUMS DURING DAY IF SHE ATE SPICY FOODS    . colchicine 0.6 MG tablet Take 1 tablet (0.6 mg total) by mouth daily. Take 2 pills immediately and then 1 pill in an hour if still hurting. 30 tablet 0  . lisinopril-hydrochlorothiazide (PRINZIDE,ZESTORETIC) 20-25 MG tablet TAKE 1 TABLET BY MOUTH DAILY 90 tablet 0  . loratadine (CLARITIN) 10 MG tablet TAKE 1 TABLET BY MOUTH DAILY 90 tablet 9  . LUNESTA 2 MG TABS tablet TAKE 1 TABLET AT BEDTIME (Patient taking differently: TAKE 1 TABLET AT BEDTIME - Just when traveling) 30 tablet 3  . metFORMIN (GLUCOPHAGE) 500 MG tablet TAKE 2 TABLETS BY MOUTH TWICE DAILY WITH A MEAL 360 tablet 2  . Multiple Vitamin (MULTIVITAMIN WITH MINERALS) TABS Take 1 tablet by mouth daily.    . pioglitazone (ACTOS) 15 MG tablet Take 1 tablet (15 mg total) by mouth daily. 90 tablet 3  . simvastatin (ZOCOR) 80 MG tablet Take 0.5 tablets (40 mg total) by mouth at bedtime. Takes 1/2 tablet 90 tablet 1  . triamcinolone ointment (KENALOG) 0.1 % Apply 1 application topically 2 (two) times daily.    . [DISCONTINUED] levothyroxine (SYNTHROID, LEVOTHROID) 75 MCG tablet TAKE ONE TABLET BY MOUTH ONCE DAILY BEFORE  BREAKFAST 90 tablet 0   No current facility-administered medications on file prior to visit.   Allergies  Allergen Reactions  . Clindamycin/Lincomycin     CLINDAMYCIN TAKEN WITH CIPRO CAUSED  SEVERE NAUSEA--PT STATES SHE CAN TAKE CIPRO ALONE WITHOUT PROBLEM--IT WAS JUST THE COMBINATION SHE DID NOT TOLERATE.  Marland Kitchen Levaquin [Levofloxacin] Nausea And Vomiting  . Metronidazole Hcl Other (See Comments)    Deathly sick WHEN PT TOOK METRONIDAZOLE WITH CIPRO.  PT STATES SHE HAS TAKEN CIPRO ALONE WITHOUT PROBLEM --JUST DID NOT TOLERATE THE COMBINATION OF BOTH DRUGS TAKEN TOGETHER.   Social History   Social History  . Marital Status: Married    Spouse Name: N/A  . Number of Children: N/A  . Years of Education: N/A   Occupational History  . Not on file.   Social History Main Topics  .  Smoking status: Never Smoker   . Smokeless tobacco: Not on file  . Alcohol Use: No  . Drug Use: No  . Sexual Activity: Not on file   Other Topics Concern  . Not on file   Social History Narrative     Review of Systems  All other systems reviewed and are negative.      Objective:   Physical Exam  Constitutional: She appears well-developed and well-nourished.  Cardiovascular: Normal rate, regular rhythm and normal heart sounds.   No murmur heard. Pulmonary/Chest: Effort normal and breath sounds normal. No respiratory distress. She has no wheezes. She has no rales.  Abdominal: Soft. Bowel sounds are normal. She exhibits no distension. There is no tenderness. There is no rebound.  Vitals reviewed.         Assessment & Plan:  Controlled type 2 diabetes mellitus with diabetic nephropathy, without long-term current use of insulin (HCC)  HLD (hyperlipidemia)  Essential hypertension  Diabetes is not adequately controlled. We discussed several options including trulicity, insulin, or titrating oral medications.  Ultimately, the patient elected to increase Januvia to 100 mg a day and then increase Actos to 30 mg a day and recheck in 3 months. She will also try to increase aerobic exercise, follow a consistent low carb diet, and lose weight which she has not been successfully able to do. Blood  pressure is well controlled. Cholesterol is adequately controlled.

## 2016-01-03 ENCOUNTER — Other Ambulatory Visit: Payer: Self-pay | Admitting: Family Medicine

## 2016-01-03 NOTE — Telephone Encounter (Signed)
Test strips refilled

## 2016-01-12 ENCOUNTER — Other Ambulatory Visit: Payer: Self-pay | Admitting: Family Medicine

## 2016-01-12 MED ORDER — SITAGLIPTIN PHOSPHATE 100 MG PO TABS: 100.0000 mg | ORAL_TABLET | Freq: Every day | ORAL | Status: DC

## 2016-01-12 MED ORDER — PIOGLITAZONE HCL 30 MG PO TABS: 30.0000 mg | ORAL_TABLET | Freq: Every day | ORAL | Status: DC

## 2016-01-18 ENCOUNTER — Encounter: Payer: Self-pay | Admitting: Family Medicine

## 2016-01-18 ENCOUNTER — Ambulatory Visit (INDEPENDENT_AMBULATORY_CARE_PROVIDER_SITE_OTHER): Payer: Medicare Other | Admitting: Family Medicine

## 2016-01-18 ENCOUNTER — Ambulatory Visit
Admission: RE | Admit: 2016-01-18 | Discharge: 2016-01-18 | Disposition: A | Discharge status: Home / Self Care | Payer: Medicare Other | Source: Ambulatory Visit | Attending: Family Medicine | Admitting: Family Medicine

## 2016-01-18 VITALS — BP 120/60 | HR 84 | Temp 98.2°F | Resp 18 | Ht 63.0 in | Wt 235.0 lb

## 2016-01-18 DIAGNOSIS — M109 Gout, unspecified: Secondary | ICD-10-CM

## 2016-01-18 DIAGNOSIS — M1000 Idiopathic gout, unspecified site: Secondary | ICD-10-CM

## 2016-01-18 DIAGNOSIS — M25579 Pain in unspecified ankle and joints of unspecified foot: Secondary | ICD-10-CM | POA: Diagnosis not present

## 2016-01-18 MED ORDER — PREDNISONE 20 MG PO TABS
ORAL_TABLET | ORAL | Status: DC
Start: 2016-01-18 — End: 2016-04-06

## 2016-01-18 NOTE — Progress Notes (Signed)
Subjective:    Patient ID: Whitney Morales, female    DOB: 08/16/1941, 75 y.o.   MRN: [id number]  HPI 12/10/15 Patient reports one week of severe pain in her right first MTP joint. The joint is erythematous swollen painful and warm. It hurts to touch the skin. She has a history of gout.  At that time, my plan was: Begin colchicine 1.2 mg by mouth 1 and then repeat 0.6 mg in 1 hour if pain persists. She can repeat this process on a daily basis until pain improves. We discussed starting the patient on allopurinol as a preventative or stopping hydrochlorothiazide. However at the present time she elects to make no changes to prevent this. These attacks are occurring infrequently. At the present time she prefers just to treat the attack.  01/18/16 Uric acid 7.9 in 3/16.  She can use to complain of pain in the right first MTP joint. She states that it is sore simply to the touch. It hurts to bear weight when she is walking. The joint still appears slightly swollen. It is not warm to the touch. It is not erythematous. Past Medical History  Diagnosis Date  . Diverticulitis   . Diabetes mellitus   . Hypertension   . High cholesterol   . Fatigue   . Obesity   . H/O hiatal hernia   . Goiter     cyst vs goiter on thyroid  . GERD (gastroesophageal reflux disease)     PAST HX--NO PROBLEMS NOW-PT DOES TAKE DAILY TUMS AT BEDTIME  . Abnormal finding on EKG 10/29/2012    FOLLOW UP NUCLEAR STRESS TEST ON 11/05/12 -NORMAL  . Corneal erosion 2012    RIGHT EYE--HAS RESOLVED--NO PROBLEMS SINCE   Past Surgical History  Procedure Laterality Date  . Abdominal hysterectomy    . Tonsillectomy    . US echocardiography  03/23/2006    EF 55-60%  . Cardiovascular stress test  06/08/2009    EF 78%, NO ISCHEMIA  . Breast lumpectomy      BENIGN  . Thyroid lobectomy  11/27/2012    Procedure: THYROID LOBECTOMY;  Surgeon: Ralene Ok, MD;  Location: [facility];  Service: General;  Laterality: Right;  Right Thyroid  Lobectomy   Current Outpatient Prescriptions on File Prior to Visit  Medication Sig Dispense Refill  . ACCU-CHEK SOFTCLIX LANCETS lancets USE AS DIRECTED 100 each 5  . aspirin 81 MG tablet Take 81 mg by mouth daily.     . Blood Glucose Monitoring Suppl (ACCU-CHEK AVIVA PLUS) W/DEVICE KIT PT NEEDS METER-STRIPS(1 BOTTLE =100/5 REFILLS)-LANCETS(1 BOX/5 REFILLS) CHECKS BS QD DX: 250.00 1 kit 0  . Calcium Carbonate Antacid (TUMS PO) Take by mouth at bedtime. RARELY WOULD NEED ADDITIONAL TUMS DURING DAY IF SHE ATE SPICY FOODS    . colchicine 0.6 MG tablet Take 1 tablet (0.6 mg total) by mouth daily. Take 2 pills immediately and then 1 pill in an hour if still hurting. 30 tablet 0  . glucose blood (ACCU-CHEK AVIVA PLUS) test strip 1 each by Other route 2 (two) times daily. Check blood sugar twice daily  Fasting in AM, 2 hrs after a meal  E11.65 200 each 3  . lisinopril-hydrochlorothiazide (PRINZIDE,ZESTORETIC) 20-25 MG tablet TAKE 1 TABLET BY MOUTH DAILY 90 tablet 0  . loratadine (CLARITIN) 10 MG tablet TAKE 1 TABLET BY MOUTH DAILY 90 tablet 9  . LUNESTA 2 MG TABS tablet TAKE 1 TABLET AT BEDTIME (Patient taking differently: TAKE 1 TABLET AT BEDTIME -  Just when traveling) 30 tablet 3  . metFORMIN (GLUCOPHAGE) 500 MG tablet TAKE 2 TABLETS BY MOUTH TWICE DAILY WITH A MEAL 360 tablet 2  . Multiple Vitamin (MULTIVITAMIN WITH MINERALS) TABS Take 1 tablet by mouth daily.    . pioglitazone (ACTOS) 30 MG tablet Take 1 tablet (30 mg total) by mouth daily. 90 tablet 1  . simvastatin (ZOCOR) 80 MG tablet Take 0.5 tablets (40 mg total) by mouth at bedtime. Takes 1/2 tablet 90 tablet 1  . sitaGLIPtin (JANUVIA) 100 MG tablet Take 1 tablet (100 mg total) by mouth daily. 90 tablet 1  . triamcinolone ointment (KENALOG) 0.1 % Apply 1 application topically 2 (two) times daily.    . [DISCONTINUED] levothyroxine (SYNTHROID, LEVOTHROID) 75 MCG tablet TAKE ONE TABLET BY MOUTH ONCE DAILY BEFORE  BREAKFAST 90 tablet 0   No  current facility-administered medications on file prior to visit.   Allergies  Allergen Reactions  . Clindamycin/Lincomycin     CLINDAMYCIN TAKEN WITH CIPRO CAUSED SEVERE NAUSEA--PT STATES SHE CAN TAKE CIPRO ALONE WITHOUT PROBLEM--IT WAS JUST THE COMBINATION SHE DID NOT TOLERATE.  Marland Kitchen Levaquin [Levofloxacin] Nausea And Vomiting  . Metronidazole Hcl Other (See Comments)    Deathly sick WHEN PT TOOK METRONIDAZOLE WITH CIPRO.  PT STATES SHE HAS TAKEN CIPRO ALONE WITHOUT PROBLEM --JUST DID NOT TOLERATE THE COMBINATION OF BOTH DRUGS TAKEN TOGETHER.   Social History   Social History  . Marital Status: Married    Spouse Name: N/A  . Number of Children: N/A  . Years of Education: N/A   Occupational History  . Not on file.   Social History Main Topics  . Smoking status: Never Smoker   . Smokeless tobacco: Not on file  . Alcohol Use: No  . Drug Use: No  . Sexual Activity: Not on file   Other Topics Concern  . Not on file   Social History Narrative   No family history on file. .  Review of Systems  All other systems reviewed and are negative.      Objective:   Physical Exam  Constitutional: She appears well-developed and well-nourished. No distress.  Cardiovascular: Normal rate, regular rhythm and intact distal pulses.   Pulmonary/Chest: Effort normal and breath sounds normal.  Musculoskeletal: She exhibits tenderness. She exhibits no edema.  Skin: Skin is warm. She is not diaphoretic. No erythema.  Psychiatric: She has a normal mood and affect. Her behavior is normal. Judgment and thought content normal.  Vitals reviewed.         Assessment & Plan:  Pain in joint, ankle and foot, unspecified laterality  Podagra   I still suspect gout. However I will send the patient for an x-ray to rule out a stress fracture or other skeletal pathology in the first MTP joint. If the x-ray is negative, I'll start the patient on a prednisone taper pack. Consider starting her on  allopurinol once pain has resolved to prevent this in the future.

## 2016-02-06 ENCOUNTER — Other Ambulatory Visit: Payer: Self-pay | Admitting: Family Medicine

## 2016-02-12 ENCOUNTER — Other Ambulatory Visit: Payer: Self-pay | Admitting: Family Medicine

## 2016-02-29 ENCOUNTER — Other Ambulatory Visit: Payer: Self-pay | Admitting: *Deleted

## 2016-02-29 MED ORDER — METFORMIN HCL 500 MG PO TABS: ORAL_TABLET | ORAL | Status: DC

## 2016-02-29 MED ORDER — SIMVASTATIN 80 MG PO TABS: 40.0000 mg | ORAL_TABLET | Freq: Every day | ORAL | Status: DC

## 2016-02-29 NOTE — Telephone Encounter (Signed)
Received fax requesting refill on Zocor and MTF.   Refill appropriate and filled per protocol.

## 2016-04-05 ENCOUNTER — Encounter: Payer: Self-pay | Admitting: Family Medicine

## 2016-04-06 ENCOUNTER — Encounter: Payer: Self-pay | Admitting: Family Medicine

## 2016-04-06 ENCOUNTER — Ambulatory Visit
Admission: RE | Admit: 2016-04-06 | Discharge: 2016-04-06 | Disposition: A | Discharge status: Home / Self Care | Payer: Medicare Other | Source: Ambulatory Visit | Attending: Family Medicine | Admitting: Family Medicine

## 2016-04-06 ENCOUNTER — Ambulatory Visit (INDEPENDENT_AMBULATORY_CARE_PROVIDER_SITE_OTHER): Payer: Medicare Other | Admitting: Family Medicine

## 2016-04-06 VITALS — BP 140/78 | HR 82 | Temp 98.0°F | Resp 18 | Ht 63.0 in | Wt 237.0 lb

## 2016-04-06 DIAGNOSIS — M5432 Sciatica, left side: Secondary | ICD-10-CM

## 2016-04-06 NOTE — Progress Notes (Signed)
Subjective:    Patient ID: Whitney Morales, female    DOB: 1940/10/31, 75 y.o.   MRN: [id number]  HPI Patient is a very pleasant 75 year old white female who is here today complaining of low back pain. The pain radiates into her left and right gluteus muscle. The pain is worse on the left side and radiates down the lateral aspect of her left thigh. She also occasionally reports weakness in her legs primarily her left leg that seems to be related to the pain. The pain is exacerbated by prolonged standing and walking. It feels better when she leans forward or sits down. She denies any symptoms of cauda equina syndrome Past Medical History  Diagnosis Date  . Diverticulitis   . Diabetes mellitus   . Hypertension   . High cholesterol   . Fatigue   . Obesity   . H/O hiatal hernia   . Goiter     cyst vs goiter on thyroid  . GERD (gastroesophageal reflux disease)     PAST HX--NO PROBLEMS NOW-PT DOES TAKE DAILY TUMS AT BEDTIME  . Abnormal finding on EKG 10/29/2012    FOLLOW UP NUCLEAR STRESS TEST ON 11/05/12 -NORMAL  . Corneal erosion 2012    RIGHT EYE--HAS RESOLVED--NO PROBLEMS SINCE   Past Surgical History  Procedure Laterality Date  . Abdominal hysterectomy    . Tonsillectomy    . US echocardiography  03/23/2006    EF 55-60%  . Cardiovascular stress test  06/08/2009    EF 78%, NO ISCHEMIA  . Breast lumpectomy      BENIGN  . Thyroid lobectomy  11/27/2012    Procedure: THYROID LOBECTOMY;  Surgeon: Ralene Ok, MD;  Location: [facility];  Service: General;  Laterality: Right;  Right Thyroid Lobectomy   Current Outpatient Prescriptions on File Prior to Visit  Medication Sig Dispense Refill  . ACCU-CHEK SOFTCLIX LANCETS lancets USE AS DIRECTED 100 each 5  . aspirin 81 MG tablet Take 81 mg by mouth daily.     . Blood Glucose Monitoring Suppl (ACCU-CHEK AVIVA PLUS) W/DEVICE KIT PT NEEDS METER-STRIPS(1 BOTTLE =100/5 REFILLS)-LANCETS(1 BOX/5 REFILLS) CHECKS BS QD DX: 250.00 1 kit 0  . Calcium  Carbonate Antacid (TUMS PO) Take by mouth at bedtime. RARELY WOULD NEED ADDITIONAL TUMS DURING DAY IF SHE ATE SPICY FOODS    . colchicine 0.6 MG tablet TAKE 1 TABLET DAILY. TAKE 2 TABS IMMEDIATELY THEN 1 IN AN HOUR IF STILL HURTING 30 tablet 3  . glucose blood (ACCU-CHEK AVIVA PLUS) test strip 1 each by Other route 2 (two) times daily. Check blood sugar twice daily  Fasting in AM, 2 hrs after a meal  E11.65 200 each 3  . lisinopril-hydrochlorothiazide (PRINZIDE,ZESTORETIC) 20-25 MG tablet TAKE 1 TABLET BY MOUTH DAILY 90 tablet 4  . metFORMIN (GLUCOPHAGE) 500 MG tablet TAKE 2 TABLETS BY MOUTH TWICE DAILY WITH A MEAL 360 tablet 2  . Multiple Vitamin (MULTIVITAMIN WITH MINERALS) TABS Take 1 tablet by mouth daily.    . pioglitazone (ACTOS) 30 MG tablet Take 1 tablet (30 mg total) by mouth daily. 90 tablet 1  . simvastatin (ZOCOR) 80 MG tablet Take 0.5 tablets (40 mg total) by mouth at bedtime. Takes 1/2 tablet 90 tablet 1  . sitaGLIPtin (JANUVIA) 100 MG tablet Take 1 tablet (100 mg total) by mouth daily. 90 tablet 1  . triamcinolone ointment (KENALOG) 0.1 % Apply 1 application topically 2 (two) times daily.    . [DISCONTINUED] levothyroxine (SYNTHROID, LEVOTHROID) 75 MCG tablet  TAKE ONE TABLET BY MOUTH ONCE DAILY BEFORE  BREAKFAST 90 tablet 0   No current facility-administered medications on file prior to visit.   Allergies  Allergen Reactions  . Clindamycin/Lincomycin     CLINDAMYCIN TAKEN WITH CIPRO CAUSED SEVERE NAUSEA--PT STATES SHE CAN TAKE CIPRO ALONE WITHOUT PROBLEM--IT WAS JUST THE COMBINATION SHE DID NOT TOLERATE.  Marland Kitchen Levaquin [Levofloxacin] Nausea And Vomiting  . Metronidazole Hcl Other (See Comments)    Deathly sick WHEN PT TOOK METRONIDAZOLE WITH CIPRO.  PT STATES SHE HAS TAKEN CIPRO ALONE WITHOUT PROBLEM --JUST DID NOT TOLERATE THE COMBINATION OF BOTH DRUGS TAKEN TOGETHER.   Social History   Social History  . Marital Status: Married    Spouse Name: N/A  . Number of Children: N/A    . Years of Education: N/A   Occupational History  . Not on file.   Social History Main Topics  . Smoking status: Never Smoker   . Smokeless tobacco: Not on file  . Alcohol Use: No  . Drug Use: No  . Sexual Activity: Not on file   Other Topics Concern  . Not on file   Social History Narrative      Review of Systems  All other systems reviewed and are negative.      Objective:   Physical Exam  Constitutional: She appears well-developed and well-nourished. No distress.  Cardiovascular: Normal rate, regular rhythm and normal heart sounds.   Pulmonary/Chest: Effort normal and breath sounds normal.  Musculoskeletal:       Lumbar back: She exhibits decreased range of motion, tenderness and pain. She exhibits no bony tenderness and no spasm.  Skin: She is not diaphoretic.  Vitals reviewed.         Assessment & Plan:  Left sided sciatica - Plan: DG Lumbar Spine Complete  Based on her history, I suspect degenerative disc disease, spondylolysis, or possibly spinal stenosis. Begin with an x-ray of the lumbar spine. Patient will go to get the x-ray later today. I would consider starting an anti-inflammatory such as meloxicam 15 mg daily to help manage this. I caution the patient about the potential GI side effects. Consider an MRI of the lumbar spine if worsening.

## 2016-04-10 ENCOUNTER — Telehealth: Payer: Self-pay | Admitting: Family Medicine

## 2016-04-10 NOTE — Telephone Encounter (Signed)
Pt would like a call back regarding her x-ray results.  [phone]

## 2016-04-11 ENCOUNTER — Other Ambulatory Visit: Payer: Medicare Other

## 2016-04-11 ENCOUNTER — Other Ambulatory Visit: Payer: Self-pay | Admitting: Family Medicine

## 2016-04-11 DIAGNOSIS — E785 Hyperlipidemia, unspecified: Secondary | ICD-10-CM

## 2016-04-11 DIAGNOSIS — E1165 Type 2 diabetes mellitus with hyperglycemia: Principal | ICD-10-CM

## 2016-04-11 DIAGNOSIS — IMO0001 Reserved for inherently not codable concepts without codable children: Secondary | ICD-10-CM

## 2016-04-11 DIAGNOSIS — Z79899 Other long term (current) drug therapy: Secondary | ICD-10-CM

## 2016-04-11 LAB — COMPLETE METABOLIC PANEL WITH GFR
ALT: 12 U/L (ref 6–29)
AST: 14 U/L (ref 10–35)
Albumin: 4.1 g/dL (ref 3.6–5.1)
Alkaline Phosphatase: 45 U/L (ref 33–130)
BUN: 14 mg/dL (ref 7–25)
CO2: 28 mmol/L (ref 20–31)
Calcium: 9.7 mg/dL (ref 8.6–10.4)
Chloride: 102 mmol/L (ref 98–110)
Creat: 1.13 mg/dL — ABNORMAL HIGH (ref 0.60–0.93)
GFR, Est African American: 56 mL/min — ABNORMAL LOW (ref >=60)
GFR, Est Non African American: 49 mL/min — ABNORMAL LOW (ref >=60)
Glucose, Bld: 112 mg/dL — ABNORMAL HIGH (ref 70–99)
Potassium: 5.3 mmol/L (ref 3.5–5.3)
Sodium: 139 mmol/L (ref 135–146)
Total Bilirubin: 0.3 mg/dL (ref 0.2–1.2)
Total Protein: 6.7 g/dL (ref 6.1–8.1)

## 2016-04-11 LAB — HEMOGLOBIN A1C
Hgb A1c MFr Bld: 6.9 % — ABNORMAL HIGH (ref <5.7)
Mean Plasma Glucose: 151 mg/dL

## 2016-04-11 LAB — LIPID PANEL
Cholesterol: 143 mg/dL (ref 125–200)
HDL: 37 mg/dL — ABNORMAL LOW (ref >=46)
LDL Cholesterol: 72 mg/dL (ref <130)
Total CHOL/HDL Ratio: 3.9 Ratio (ref <=5.0)
Triglycerides: 168 mg/dL — ABNORMAL HIGH (ref <150)
VLDL: 34 mg/dL — ABNORMAL HIGH (ref <30)

## 2016-04-11 MED ORDER — MELOXICAM 15 MG PO TABS: 15.0000 mg | ORAL_TABLET | Freq: Every day | ORAL | Status: DC

## 2016-04-11 NOTE — Telephone Encounter (Signed)
Patient aware of results and med sent to pharm 

## 2016-04-14 ENCOUNTER — Ambulatory Visit (INDEPENDENT_AMBULATORY_CARE_PROVIDER_SITE_OTHER): Payer: Medicare Other | Admitting: Family Medicine

## 2016-04-14 ENCOUNTER — Encounter: Payer: Self-pay | Admitting: Family Medicine

## 2016-04-14 VITALS — BP 160/80 | HR 80 | Temp 98.4°F | Resp 18 | Ht 63.0 in | Wt 237.0 lb

## 2016-04-14 DIAGNOSIS — E1121 Type 2 diabetes mellitus with diabetic nephropathy: Secondary | ICD-10-CM | POA: Diagnosis not present

## 2016-04-14 DIAGNOSIS — E785 Hyperlipidemia, unspecified: Secondary | ICD-10-CM | POA: Diagnosis not present

## 2016-04-14 DIAGNOSIS — I10 Essential (primary) hypertension: Secondary | ICD-10-CM

## 2016-04-14 DIAGNOSIS — M5432 Sciatica, left side: Secondary | ICD-10-CM

## 2016-04-14 NOTE — Progress Notes (Signed)
Subjective:    Patient ID: Whitney Morales, female    DOB: [birth year], 75 y.o.   MRN: [id number]  HPI 04/06/16 Patient is a very pleasant 75 year old white female who is here today complaining of low back pain. The pain radiates into her left and right gluteus muscle. The pain is worse on the left side and radiates down the lateral aspect of her left thigh. She also occasionally reports weakness in her legs primarily her left leg that seems to be related to the pain. The pain is exacerbated by prolonged standing and walking. It feels better when she leans forward or sits down. She denies any symptoms of cauda equina syndrome.  At that time, my plan was: Based on her history, I suspect degenerative disc disease, spondylolysis, or possibly spinal stenosis. Begin with an x-ray of the lumbar spine. Patient will go to get the x-ray later today. I would consider starting an anti-inflammatory such as meloxicam 15 mg daily to help manage this. I caution the patient about the potential GI side effects. Consider an MRI of the lumbar spine if worsening.  04/14/16 X-ray revealed near bone on bone degenerative disc disease at L5-S1 likely causing her low back pain and I suspect that's where she has neural impingement. However meloxicam seems to be helping. She is here today to follow-up to discuss her diabetes, cholesterol, and high blood pressure. She has really been watching her diet. Her hemoglobin A1c has fallen from 7.5-6.9. I'm very happy about this. Her cholesterol remains well controlled. Her triglycerides a reasonable. Her good cholesterol will improve aerobic exercise. However her blood pressure today is extremely high. She is checking her blood pressure at home and finding it to be 130s to 140/70-80. She believes this is due to pain in her lower back but she is still experiencing. Lab on 04/11/2016  Component Date Value Ref Range Status  . Hgb A1c MFr Bld 04/11/2016 6.9* <5.7 % Final   Comment:   For  someone without known diabetes, a hemoglobin A1c value of 6.5% or greater indicates that they may have diabetes and this should be confirmed with a follow-up test.   For someone with known diabetes, a value <7% indicates that their diabetes is well controlled and a value greater than or equal to 7% indicates suboptimal control. A1c targets should be individualized based on duration of diabetes, age, comorbid conditions, and other considerations.   Currently, no consensus exists for use of hemoglobin A1c for diagnosis of diabetes for children.     . Mean Plasma Glucose 04/11/2016 151   Final  . Sodium 04/11/2016 139  135 - 146 mmol/L Final  . Potassium 04/11/2016 5.3  3.5 - 5.3 mmol/L Final  . Chloride 04/11/2016 102  98 - 110 mmol/L Final  . CO2 04/11/2016 28  20 - 31 mmol/L Final  . Glucose, Bld 04/11/2016 112* 70 - 99 mg/dL Final  . BUN 04/11/2016 14  7 - 25 mg/dL Final  . Creat 04/11/2016 1.13* 0.60 - 0.93 mg/dL Final   Comment:   For patients > or = 73 years of age: The upper reference limit for Creatinine is approximately 13% higher for people identified as African-American.     . Total Bilirubin 04/11/2016 0.3  0.2 - 1.2 mg/dL Final  . Alkaline Phosphatase 04/11/2016 45  33 - 130 U/L Final  . AST 04/11/2016 14  10 - 35 U/L Final  . ALT 04/11/2016 12  6 - 29 U/L Final  .  Total Protein 04/11/2016 6.7  6.1 - 8.1 g/dL Final  . Albumin 04/11/2016 4.1  3.6 - 5.1 g/dL Final  . Calcium 04/11/2016 9.7  8.6 - 10.4 mg/dL Final  . GFR, Est African American 04/11/2016 56* >=60 mL/min Final  . GFR, Est Non African American 04/11/2016 49* >=60 mL/min Final  . Cholesterol 04/11/2016 143  125 - 200 mg/dL Final  . Triglycerides 04/11/2016 168* <150 mg/dL Final  . HDL 04/11/2016 37* >=46 mg/dL Final  . Total CHOL/HDL Ratio 04/11/2016 3.9  <=5.0 Ratio Final  . VLDL 04/11/2016 34* <30 mg/dL Final  . LDL Cholesterol 04/11/2016 72  <130 mg/dL Final   Comment:   Total Cholesterol/HDL  Ratio:CHD Risk                        Coronary Heart Disease Risk Table                                        Men       Women          1/2 Average Risk              3.4        3.3              Average Risk              5.0        4.4           2X Average Risk              9.6        7.1           3X Average Risk             23.4       11.0 Use the calculated Patient Ratio above and the CHD Risk table  to determine the patient's CHD Risk.   Abstract on 04/05/2016  Component Date Value Ref Range Status  . HM Mammogram 11/27/2014 0-4 Bi-Rad  0-4 Bi-Rad, Self Reported Normal Final   [facility]    Past Medical History  Diagnosis Date  . Diverticulitis   . Diabetes mellitus   . Hypertension   . High cholesterol   . Fatigue   . Obesity   . H/O hiatal hernia   . Goiter     cyst vs goiter on thyroid  . GERD (gastroesophageal reflux disease)     PAST HX--NO PROBLEMS NOW-PT DOES TAKE DAILY TUMS AT BEDTIME  . Abnormal finding on EKG 10/29/2012    FOLLOW UP NUCLEAR STRESS TEST ON 11/05/12 -NORMAL  . Corneal erosion 2012    RIGHT EYE--HAS RESOLVED--NO PROBLEMS SINCE   Past Surgical History  Procedure Laterality Date  . Abdominal hysterectomy    . Tonsillectomy    . US echocardiography  03/23/2006    EF 55-60%  . Cardiovascular stress test  06/08/2009    EF 78%, NO ISCHEMIA  . Breast lumpectomy      BENIGN  . Thyroid lobectomy  11/27/2012    Procedure: THYROID LOBECTOMY;  Surgeon: Ralene Ok, MD;  Location: [facility];  Service: General;  Laterality: Right;  Right Thyroid Lobectomy   Current Outpatient Prescriptions on File Prior to Visit  Medication Sig Dispense Refill  . ACCU-CHEK SOFTCLIX LANCETS lancets USE AS DIRECTED 100 each 5  .  aspirin 81 MG tablet Take 81 mg by mouth daily.     . Blood Glucose Monitoring Suppl (ACCU-CHEK AVIVA PLUS) W/DEVICE KIT PT NEEDS METER-STRIPS(1 BOTTLE =100/5 REFILLS)-LANCETS(1 BOX/5 REFILLS) CHECKS BS QD DX: 250.00 1 kit 0  . Calcium Carbonate Antacid  (TUMS PO) Take by mouth at bedtime. RARELY WOULD NEED ADDITIONAL TUMS DURING DAY IF SHE ATE SPICY FOODS    . colchicine 0.6 MG tablet TAKE 1 TABLET DAILY. TAKE 2 TABS IMMEDIATELY THEN 1 IN AN HOUR IF STILL HURTING 30 tablet 3  . glucose blood (ACCU-CHEK AVIVA PLUS) test strip 1 each by Other route 2 (two) times daily. Check blood sugar twice daily  Fasting in AM, 2 hrs after a meal  E11.65 200 each 3  . lisinopril-hydrochlorothiazide (PRINZIDE,ZESTORETIC) 20-25 MG tablet TAKE 1 TABLET BY MOUTH DAILY 90 tablet 4  . meloxicam (MOBIC) 15 MG tablet Take 1 tablet (15 mg total) by mouth daily. 90 tablet 0  . metFORMIN (GLUCOPHAGE) 500 MG tablet TAKE 2 TABLETS BY MOUTH TWICE DAILY WITH A MEAL 360 tablet 2  . Multiple Vitamin (MULTIVITAMIN WITH MINERALS) TABS Take 1 tablet by mouth daily.    . pioglitazone (ACTOS) 30 MG tablet Take 1 tablet (30 mg total) by mouth daily. 90 tablet 1  . simvastatin (ZOCOR) 80 MG tablet Take 0.5 tablets (40 mg total) by mouth at bedtime. Takes 1/2 tablet 90 tablet 1  . sitaGLIPtin (JANUVIA) 100 MG tablet Take 1 tablet (100 mg total) by mouth daily. 90 tablet 1  . triamcinolone ointment (KENALOG) 0.1 % Apply 1 application topically 2 (two) times daily.    . [DISCONTINUED] levothyroxine (SYNTHROID, LEVOTHROID) 75 MCG tablet TAKE ONE TABLET BY MOUTH ONCE DAILY BEFORE  BREAKFAST 90 tablet 0   No current facility-administered medications on file prior to visit.   Allergies  Allergen Reactions  . Clindamycin/Lincomycin     CLINDAMYCIN TAKEN WITH CIPRO CAUSED SEVERE NAUSEA--PT STATES SHE CAN TAKE CIPRO ALONE WITHOUT PROBLEM--IT WAS JUST THE COMBINATION SHE DID NOT TOLERATE.  Marland Kitchen Levaquin [Levofloxacin] Nausea And Vomiting  . Metronidazole Hcl Other (See Comments)    Deathly sick WHEN PT TOOK METRONIDAZOLE WITH CIPRO.  PT STATES SHE HAS TAKEN CIPRO ALONE WITHOUT PROBLEM --JUST DID NOT TOLERATE THE COMBINATION OF BOTH DRUGS TAKEN TOGETHER.   Social History   Social History  .  Marital Status: Married    Spouse Name: N/A  . Number of Children: N/A  . Years of Education: N/A   Occupational History  . Not on file.   Social History Main Topics  . Smoking status: Never Smoker   . Smokeless tobacco: Not on file  . Alcohol Use: No  . Drug Use: No  . Sexual Activity: Not on file   Other Topics Concern  . Not on file   Social History Narrative      Review of Systems  All other systems reviewed and are negative.      Objective:   Physical Exam  Constitutional: She appears well-developed and well-nourished. No distress.  Cardiovascular: Normal rate, regular rhythm and normal heart sounds.   Pulmonary/Chest: Effort normal and breath sounds normal.  Musculoskeletal:       Lumbar back: She exhibits decreased range of motion, tenderness and pain. She exhibits no bony tenderness and no spasm.  Skin: She is not diaphoretic.  Vitals reviewed.         Assessment & Plan:  Left sided sciatica  Controlled type 2 diabetes mellitus with diabetic nephropathy,  without long-term current use of insulin (HCC)  HLD (hyperlipidemia)  Essential hypertension  At the present time, the patient would like to continue meloxicam for her low back pain and sciatica. Should the symptoms worsen we will proceed with an MRI and possible epidural steroid injection. Her cholesterol is excellent. I would like to see her triglycerides improved by decreasing saturated fat in her diet as well as carbohydrates. I would like to see her HDL improved by increasing her aerobic exercise and losing weight. I am very happy with her hemoglobin A1c. This is the best that she has been for me. I will recheck this in 6 months to make no changes in her medication at this time. I am very concerned by her blood pressure. She states that she will check it daily for the next week and notify me of the values. She believes that this is a fluke

## 2016-05-01 ENCOUNTER — Telehealth: Payer: Self-pay | Admitting: Family Medicine

## 2016-05-01 NOTE — Telephone Encounter (Signed)
pts feet started swelling when she increased the Actos and Januvia a couple of weeks ago. She wants to know if she should lower the dose. Please advise.  [phone]

## 2016-05-01 NOTE — Telephone Encounter (Signed)
Swelling is due to actos, but I would not decrease dose.

## 2016-05-02 NOTE — Telephone Encounter (Signed)
Called placed - no answer and no vm

## 2016-05-03 NOTE — Telephone Encounter (Signed)
Patient's husband aware of providers recommendations.  

## 2016-05-05 LAB — HM DIABETES EYE EXAM

## 2016-05-20 ENCOUNTER — Other Ambulatory Visit: Payer: Self-pay | Admitting: Family Medicine

## 2016-05-25 ENCOUNTER — Encounter: Payer: Self-pay | Admitting: Family Medicine

## 2016-05-26 ENCOUNTER — Other Ambulatory Visit: Payer: Self-pay | Admitting: Family Medicine

## 2016-07-04 ENCOUNTER — Other Ambulatory Visit: Payer: Self-pay | Admitting: Family Medicine

## 2016-08-01 ENCOUNTER — Other Ambulatory Visit: Payer: Medicare Other

## 2016-08-01 DIAGNOSIS — E119 Type 2 diabetes mellitus without complications: Secondary | ICD-10-CM

## 2016-08-01 DIAGNOSIS — I10 Essential (primary) hypertension: Secondary | ICD-10-CM

## 2016-08-01 DIAGNOSIS — Z79899 Other long term (current) drug therapy: Secondary | ICD-10-CM

## 2016-08-01 DIAGNOSIS — E785 Hyperlipidemia, unspecified: Secondary | ICD-10-CM

## 2016-08-01 DIAGNOSIS — E039 Hypothyroidism, unspecified: Secondary | ICD-10-CM

## 2016-08-01 LAB — COMPLETE METABOLIC PANEL WITH GFR
ALT: 10 U/L (ref 6–29)
AST: 13 U/L (ref 10–35)
Albumin: 4.1 g/dL (ref 3.6–5.1)
Alkaline Phosphatase: 45 U/L (ref 33–130)
BUN: 17 mg/dL (ref 7–25)
CO2: 29 mmol/L (ref 20–31)
Calcium: 9.6 mg/dL (ref 8.6–10.4)
Chloride: 102 mmol/L (ref 98–110)
Creat: 1.1 mg/dL — ABNORMAL HIGH (ref 0.60–0.93)
GFR, Est African American: 58 mL/min — ABNORMAL LOW (ref >=60)
GFR, Est Non African American: 50 mL/min — ABNORMAL LOW (ref >=60)
Glucose, Bld: 126 mg/dL — ABNORMAL HIGH (ref 70–99)
Potassium: 5.3 mmol/L (ref 3.5–5.3)
Sodium: 141 mmol/L (ref 135–146)
Total Bilirubin: 0.3 mg/dL (ref 0.2–1.2)
Total Protein: 6.8 g/dL (ref 6.1–8.1)

## 2016-08-01 LAB — CBC WITH DIFFERENTIAL/PLATELET
Basophils Absolute: 78 cells/uL (ref 0–200)
Basophils Relative: 1 %
Eosinophils Absolute: 156 cells/uL (ref 15–500)
Eosinophils Relative: 2 %
HCT: 38 % (ref 35.0–45.0)
Hemoglobin: 12.2 g/dL (ref 12.0–15.0)
Lymphocytes Relative: 30 %
Lymphs Abs: 2340 cells/uL (ref 850–3900)
MCH: 29.3 pg (ref 27.0–33.0)
MCHC: 32.1 g/dL (ref 32.0–36.0)
MCV: 91.1 fL (ref 80.0–100.0)
MPV: 9.9 fL (ref 7.5–12.5)
Monocytes Absolute: 858 cells/uL (ref 200–950)
Monocytes Relative: 11 %
Neutro Abs: 4368 cells/uL (ref 1500–7800)
Neutrophils Relative %: 56 %
Platelets: 311 10*3/uL (ref 140–400)
RBC: 4.17 MIL/uL (ref 3.80–5.10)
RDW: 13.9 % (ref 11.0–15.0)
WBC: 7.8 10*3/uL (ref 3.8–10.8)

## 2016-08-01 LAB — LIPID PANEL
Cholesterol: 159 mg/dL (ref 125–200)
HDL: 35 mg/dL — ABNORMAL LOW (ref >=46)
LDL Cholesterol: 98 mg/dL (ref <130)
Total CHOL/HDL Ratio: 4.5 Ratio (ref <=5.0)
Triglycerides: 131 mg/dL (ref <150)
VLDL: 26 mg/dL (ref <30)

## 2016-08-01 LAB — TSH: TSH: 4.05 mIU/L

## 2016-08-02 LAB — HEMOGLOBIN A1C
Hgb A1c MFr Bld: 6.4 % — ABNORMAL HIGH (ref <5.7)
Mean Plasma Glucose: 137 mg/dL

## 2016-08-08 ENCOUNTER — Ambulatory Visit (INDEPENDENT_AMBULATORY_CARE_PROVIDER_SITE_OTHER): Payer: Medicare Other | Admitting: Family Medicine

## 2016-08-08 ENCOUNTER — Other Ambulatory Visit: Payer: Self-pay | Admitting: Family Medicine

## 2016-08-08 ENCOUNTER — Encounter: Payer: Self-pay | Admitting: Family Medicine

## 2016-08-08 VITALS — BP 160/70 | HR 88 | Temp 98.2°F | Resp 18 | Ht 63.0 in | Wt 237.0 lb

## 2016-08-08 DIAGNOSIS — Z23 Encounter for immunization: Secondary | ICD-10-CM

## 2016-08-08 DIAGNOSIS — E7800 Pure hypercholesterolemia, unspecified: Secondary | ICD-10-CM | POA: Diagnosis not present

## 2016-08-08 DIAGNOSIS — I10 Essential (primary) hypertension: Secondary | ICD-10-CM

## 2016-08-08 DIAGNOSIS — E1121 Type 2 diabetes mellitus with diabetic nephropathy: Secondary | ICD-10-CM | POA: Diagnosis not present

## 2016-08-08 NOTE — Progress Notes (Signed)
Subjective:    Patient ID: Whitney Morales, female    DOB: 1941/06/30, 75 y.o.   MRN: [id number]  HPI Wt Readings from Last 3 Encounters:  08/08/16 237 lb (107.5 kg)  04/14/16 237 lb (107.5 kg)  04/06/16 237 lb (107.5 kg)      Review of Systems     Objective:   Physical Exam        Assessment & Plan:     Subjective:    Patient ID: Whitney Morales, female    DOB: 1941-03-23, 75 y.o.   MRN: [id number]  HPI  She is here today for follow-up. She is currently using metformin 1000 mg by mouth twice a day, Januvia 100 mg by mouth daily, and Actos 30 mg by mouth daily to control her diabetes. Her blood pressure is well controlled. She denies any chest pain shortness of breath or dyspnea on exertion. She denies any myalgias or right upper quadrant pain. She denies any polyuria, polydipsia, or blurred vision. She denies any neuropathy. She denies any hypoglycemic episodes. Lab on 08/01/2016  Component Date Value Ref Range Status  . Sodium 08/01/2016 141  135 - 146 mmol/L Final  . Potassium 08/01/2016 5.3  3.5 - 5.3 mmol/L Final  . Chloride 08/01/2016 102  98 - 110 mmol/L Final  . CO2 08/01/2016 29  20 - 31 mmol/L Final  . Glucose, Bld 08/01/2016 126* 70 - 99 mg/dL Final  . BUN 08/01/2016 17  7 - 25 mg/dL Final  . Creat 08/01/2016 1.10* 0.60 - 0.93 mg/dL Final   Comment:   For patients > or = 73 years of age: The upper reference limit for Creatinine is approximately 13% higher for people identified as African-American.     . Total Bilirubin 08/01/2016 0.3  0.2 - 1.2 mg/dL Final  . Alkaline Phosphatase 08/01/2016 45  33 - 130 U/L Final  . AST 08/01/2016 13  10 - 35 U/L Final  . ALT 08/01/2016 10  6 - 29 U/L Final  . Total Protein 08/01/2016 6.8  6.1 - 8.1 g/dL Final  . Albumin 08/01/2016 4.1  3.6 - 5.1 g/dL Final  . Calcium 08/01/2016 9.6  8.6 - 10.4 mg/dL Final  . GFR, Est African American 08/01/2016 58* >=60 mL/min Final  . GFR, Est Non African American 08/01/2016 50*  >=60 mL/min Final  . Cholesterol 08/01/2016 159  125 - 200 mg/dL Final  . Triglycerides 08/01/2016 131  <150 mg/dL Final  . HDL 08/01/2016 35* >=46 mg/dL Final  . Total CHOL/HDL Ratio 08/01/2016 4.5  <=5.0 Ratio Final  . VLDL 08/01/2016 26  <30 mg/dL Final  . LDL Cholesterol 08/01/2016 98  <130 mg/dL Final   Comment:   Total Cholesterol/HDL Ratio:CHD Risk                        Coronary Heart Disease Risk Table                                        Men       Women          1/2 Average Risk              3.4        3.3              Average Risk  5.0        4.4           2X Average Risk              9.6        7.1           3X Average Risk             23.4       11.0 Use the calculated Patient Ratio above and the CHD Risk table  to determine the patient's CHD Risk.   . Hgb A1c MFr Bld 08/02/2016 6.4* <5.7 % Final   Comment:   For someone without known diabetes, a hemoglobin A1c value between 5.7% and 6.4% is consistent with prediabetes and should be confirmed with a follow-up test.   For someone with known diabetes, a value <7% indicates that their diabetes is well controlled. A1c targets should be individualized based on duration of diabetes, age, co-morbid conditions and other considerations.   This assay result is consistent with an increased risk of diabetes.   Currently, no consensus exists regarding use of hemoglobin A1c for diagnosis of diabetes in children.     . Mean Plasma Glucose 08/02/2016 137  mg/dL Final  . TSH 08/01/2016 4.05  mIU/L Final   Comment:   Reference Range   > or = 20 Years  0.40-4.50   Pregnancy Range First trimester  0.26-2.66 Second trimester 0.55-2.73 Third trimester  0.43-2.91     . WBC 08/01/2016 7.8  3.8 - 10.8 K/uL Final  . RBC 08/01/2016 4.17  3.80 - 5.10 MIL/uL Final  . Hemoglobin 08/01/2016 12.2  12.0 - 15.0 g/dL Final  . HCT 08/01/2016 38.0  35.0 - 45.0 % Final  . MCV 08/01/2016 91.1  80.0 - 100.0 fL Final  . MCH  08/01/2016 29.3  27.0 - 33.0 pg Final  . MCHC 08/01/2016 32.1  32.0 - 36.0 g/dL Final  . RDW 08/01/2016 13.9  11.0 - 15.0 % Final  . Platelets 08/01/2016 311  140 - 400 K/uL Final  . MPV 08/01/2016 9.9  7.5 - 12.5 fL Final  . Neutro Abs 08/01/2016 4368  1,500 - 7,800 cells/uL Final  . Lymphs Abs 08/01/2016 2340  850 - 3,900 cells/uL Final  . Monocytes Absolute 08/01/2016 858  200 - 950 cells/uL Final  . Eosinophils Absolute 08/01/2016 156  15 - 500 cells/uL Final  . Basophils Absolute 08/01/2016 78  0 - 200 cells/uL Final  . Neutrophils Relative % 08/01/2016 56  % Final  . Lymphocytes Relative 08/01/2016 30  % Final  . Monocytes Relative 08/01/2016 11  % Final  . Eosinophils Relative 08/01/2016 2  % Final  . Basophils Relative 08/01/2016 1  % Final  . Smear Review 08/01/2016 Criteria for review not met   Final    Past Medical History:  Diagnosis Date  . Abnormal finding on EKG 10/29/2012   FOLLOW UP NUCLEAR STRESS TEST ON 11/05/12 -NORMAL  . Corneal erosion 2012   RIGHT EYE--HAS RESOLVED--NO PROBLEMS SINCE  . Diabetes mellitus   . Diverticulitis   . Fatigue   . GERD (gastroesophageal reflux disease)    PAST HX--NO PROBLEMS NOW-PT DOES TAKE DAILY TUMS AT BEDTIME  . Goiter    cyst vs goiter on thyroid  . H/O hiatal hernia   . High cholesterol   . Hypertension   . Obesity    Past Surgical History:  Procedure Laterality Date  . ABDOMINAL HYSTERECTOMY    .  BREAST LUMPECTOMY     BENIGN  . CARDIOVASCULAR STRESS TEST  06/08/2009   EF 78%, NO ISCHEMIA  . THYROID LOBECTOMY  11/27/2012   Procedure: THYROID LOBECTOMY;  Surgeon: Ralene Ok, MD;  Location: [facility];  Service: General;  Laterality: Right;  Right Thyroid Lobectomy  . TONSILLECTOMY    . US ECHOCARDIOGRAPHY  03/23/2006   EF 55-60%   Current Outpatient Prescriptions on File Prior to Visit  Medication Sig Dispense Refill  . ACCU-CHEK SOFTCLIX LANCETS lancets USE AS DIRECTED 100 each 5  . Blood Glucose Monitoring Suppl  (ACCU-CHEK AVIVA PLUS) W/DEVICE KIT PT NEEDS METER-STRIPS(1 BOTTLE =100/5 REFILLS)-LANCETS(1 BOX/5 REFILLS) CHECKS BS QD DX: 250.00 1 kit 0  . Calcium Carbonate Antacid (TUMS PO) Take by mouth at bedtime. RARELY WOULD NEED ADDITIONAL TUMS DURING DAY IF SHE ATE SPICY FOODS    . colchicine 0.6 MG tablet TAKE 1 TABLET DAILY. TAKE 2 TABS IMMEDIATELY THEN 1 IN AN HOUR IF STILL HURTING 30 tablet 3  . glucose blood (ACCU-CHEK AVIVA PLUS) test strip 1 each by Other route 2 (two) times daily. Check blood sugar twice daily  Fasting in AM, 2 hrs after a meal  E11.65 200 each 3  . levothyroxine (SYNTHROID, LEVOTHROID) 75 MCG tablet TAKE ONE TABLET BY MOUTH ONCE DAILY BEFORE BREAKFAST 90 tablet 3  . lisinopril-hydrochlorothiazide (PRINZIDE,ZESTORETIC) 20-25 MG tablet TAKE 1 TABLET BY MOUTH DAILY 90 tablet 4  . metFORMIN (GLUCOPHAGE) 500 MG tablet TAKE 2 TABLETS BY MOUTH TWICE DAILY WITH A MEAL 360 tablet 1  . Multiple Vitamin (MULTIVITAMIN WITH MINERALS) TABS Take 1 tablet by mouth daily.    . pioglitazone (ACTOS) 30 MG tablet Take 1 tablet (30 mg total) by mouth daily. 90 tablet 1  . simvastatin (ZOCOR) 80 MG tablet Take 0.5 tablets (40 mg total) by mouth at bedtime. Takes 1/2 tablet 90 tablet 1  . sitaGLIPtin (JANUVIA) 100 MG tablet Take 1 tablet (100 mg total) by mouth daily. 90 tablet 1  . triamcinolone ointment (KENALOG) 0.1 % Apply 1 application topically 2 (two) times daily.    Marland Kitchen aspirin 81 MG tablet Take 81 mg by mouth daily.     . meloxicam (MOBIC) 15 MG tablet TAKE 1 TABLET (15 MG TOTAL) BY MOUTH DAILY. (Patient not taking: Reported on 08/08/2016) 90 tablet 3   No current facility-administered medications on file prior to visit.    Allergies  Allergen Reactions  . Clindamycin/Lincomycin     CLINDAMYCIN TAKEN WITH CIPRO CAUSED SEVERE NAUSEA--PT STATES SHE CAN TAKE CIPRO ALONE WITHOUT PROBLEM--IT WAS JUST THE COMBINATION SHE DID NOT TOLERATE.  Marland Kitchen Levaquin [Levofloxacin] Nausea And Vomiting  .  Metronidazole Hcl Other (See Comments)    Deathly sick WHEN PT TOOK METRONIDAZOLE WITH CIPRO.  PT STATES SHE HAS TAKEN CIPRO ALONE WITHOUT PROBLEM --JUST DID NOT TOLERATE THE COMBINATION OF BOTH DRUGS TAKEN TOGETHER.   Social History   Social History  . Marital status: Married    Spouse name: N/A  . Number of children: N/A  . Years of education: N/A   Occupational History  . Not on file.   Social History Main Topics  . Smoking status: Never Smoker  . Smokeless tobacco: Not on file  . Alcohol use No  . Drug use: No  . Sexual activity: Not on file   Other Topics Concern  . Not on file   Social History Narrative  . No narrative on file     Review of Systems  All other  systems reviewed and are negative.      Objective:   Physical Exam  Constitutional: She appears well-developed and well-nourished.  Cardiovascular: Normal rate, regular rhythm and normal heart sounds.   No murmur heard. Pulmonary/Chest: Effort normal and breath sounds normal. No respiratory distress. She has no wheezes. She has no rales.  Abdominal: Soft. Bowel sounds are normal. She exhibits no distension. There is no tenderness. There is no rebound.  Vitals reviewed.         Assessment & Plan:  Controlled type 2 diabetes mellitus with diabetic nephropathy, without long-term current use of insulin (HCC)  Need for prophylactic vaccination against Streptococcus pneumoniae (pneumococcus) - Plan: Pneumococcal polysaccharide vaccine 23-valent greater than or equal to 2yo subcutaneous/IM  Essential hypertension  Pure hypercholesterolemia The patient's hemoglobin A1c is controlled for the first time in years at 6.4. I congratulated the patient on this. We will make no changes in her medication at this time. Her blood pressure is adequately controlled. Her LDL cholesterol is excellent. I did encourage increasing aerobic exercise and weight loss. Patient will receive her flu shot at her husband's work. She  will receive a booster on Pneumovax 23 today here.

## 2016-10-05 ENCOUNTER — Other Ambulatory Visit: Payer: Self-pay | Admitting: Family Medicine

## 2016-10-13 ENCOUNTER — Encounter: Payer: Self-pay | Admitting: Family Medicine

## 2016-10-13 ENCOUNTER — Ambulatory Visit (INDEPENDENT_AMBULATORY_CARE_PROVIDER_SITE_OTHER): Payer: Medicare Other | Admitting: Family Medicine

## 2016-10-13 VITALS — BP 138/78 | HR 78 | Temp 98.5°F | Resp 14 | Ht 63.0 in | Wt 235.0 lb

## 2016-10-13 DIAGNOSIS — J0100 Acute maxillary sinusitis, unspecified: Secondary | ICD-10-CM

## 2016-10-13 MED ORDER — AMOXICILLIN 875 MG PO TABS: 875.0000 mg | ORAL_TABLET | Freq: Two times a day (BID) | ORAL | 0 refills | Status: DC

## 2016-10-13 MED ORDER — FLUCONAZOLE 150 MG PO TABS: 150.0000 mg | ORAL_TABLET | Freq: Once | ORAL | 0 refills | Status: AC

## 2016-10-13 NOTE — Patient Instructions (Signed)
F/u AS NEEDED  

## 2016-10-13 NOTE — Progress Notes (Signed)
   Subjective:    Patient ID: Whitney Morales, female    DOB: 1941-04-22, 75 y.o.   MRN: [id number]  Patient presents for Illness (x1 week- sinus pressure, nasal congestion, green mucus)   sinus pressure drainage, > 1 week, thick drainage, no fever, mild cough from post nasal drainage, using floanse, nasal saline no improvement. No known sick contact, now has pain into her left cheek and feel like her teeth hurt.    Review Of Systems:  GEN- denies fatigue, fever, weight loss,weakness, recent illness HEENT- denies eye drainage, change in vision, +nasal discharge, CVS- denies chest pain, palpitations RESP- denies SOB, +cough, wheeze ABD- denies N/V, change in stools, abd pain GU- denies dysuria, hematuria, dribbling, incontinence MSK- denies joint pain, muscle aches, injury Neuro- denies headache, dizziness, syncope, seizure activity       Objective:    BP 138/78 (BP Location: Left Arm, Patient Position: Sitting, Cuff Size: Large)   Pulse 78   Temp 98.5 F (36.9 C) (Oral)   Resp 14   Ht 5\' 3"  (1.6 m)   Wt 235 lb (106.6 kg)   SpO2 98%   BMI 41.63 kg/m  GEN- NAD, alert and oriented x3 HEENT- PERRL, EOMI, non injected sclera, pink conjunctiva, MMM,+ post nasal drip  oropharynx mild injection, TM clear bilat no effusion, + left  maxillary sinus tenderness, inflammed turbinates,  Nasal drainage  Neck- Supple, shotty submandibular  LAD CVS- RRR, no murmur RESP-CTAB Pulses- Radial 2+          Assessment & Plan:      Problem List Items Addressed This Visit    None    Visit Diagnoses    Acute non-recurrent maxillary sinusitis    -  Primary   sinusitis, treat with amoxicillin x 10 days, continue nasal rinses,occ gets yeast infection after antibiotics, diflucan sent   Relevant Medications   amoxicillin (AMOXIL) 875 MG tablet   fluconazole (DIFLUCAN) 150 MG tablet      Note: This dictation was prepared with Dragon dictation along with smaller phrase technology. Any  transcriptional errors that result from this process are unintentional.

## 2016-10-27 ENCOUNTER — Encounter: Payer: Self-pay | Admitting: Family Medicine

## 2016-10-27 ENCOUNTER — Ambulatory Visit (INDEPENDENT_AMBULATORY_CARE_PROVIDER_SITE_OTHER): Payer: Medicare Other | Admitting: Family Medicine

## 2016-10-27 VITALS — BP 120/64 | HR 98 | Temp 99.4°F | Resp 18 | Ht 63.0 in

## 2016-10-27 DIAGNOSIS — J0100 Acute maxillary sinusitis, unspecified: Secondary | ICD-10-CM

## 2016-10-27 MED ORDER — CEFDINIR 300 MG PO CAPS: 300.0000 mg | ORAL_CAPSULE | Freq: Two times a day (BID) | ORAL | 0 refills | Status: DC

## 2016-10-27 NOTE — Progress Notes (Signed)
Subjective:    Patient ID: Whitney Morales, female    DOB: 03-19-1941, 76 y.o.   MRN: [id number]  HPI  10/13/16 Was given amoxicillin for a sinus infection by my partner.  Patient states that symptoms initially improved but subsequently returned. She reports head congestion, rhinorrhea, max sinus pain and pressure, postnasal drip, and subjective fevers.  Also reports severe sore throat due to post nasal drip.   Past Medical History:  Diagnosis Date  . Abnormal finding on EKG 10/29/2012   FOLLOW UP NUCLEAR STRESS TEST ON 11/05/12 -NORMAL  . Corneal erosion 2012   RIGHT EYE--HAS RESOLVED--NO PROBLEMS SINCE  . Diabetes mellitus   . Diverticulitis   . Fatigue   . GERD (gastroesophageal reflux disease)    PAST HX--NO PROBLEMS NOW-PT DOES TAKE DAILY TUMS AT BEDTIME  . Goiter    cyst vs goiter on thyroid  . H/O hiatal hernia   . High cholesterol   . Hypertension   . Obesity    Past Surgical History:  Procedure Laterality Date  . ABDOMINAL HYSTERECTOMY    . BREAST LUMPECTOMY     BENIGN  . CARDIOVASCULAR STRESS TEST  06/08/2009   EF 78%, NO ISCHEMIA  . THYROID LOBECTOMY  11/27/2012   Procedure: THYROID LOBECTOMY;  Surgeon: Ralene Ok, MD;  Location: [facility];  Service: General;  Laterality: Right;  Right Thyroid Lobectomy  . TONSILLECTOMY    . US ECHOCARDIOGRAPHY  03/23/2006   EF 55-60%   Current Outpatient Prescriptions on File Prior to Visit  Medication Sig Dispense Refill  . ACCU-CHEK SOFTCLIX LANCETS lancets USE AS DIRECTED TO CHECK SUGAR 100 each 1  . amoxicillin (AMOXIL) 875 MG tablet Take 1 tablet (875 mg total) by mouth 2 (two) times daily. 20 tablet 0  . aspirin 81 MG tablet Take 81 mg by mouth daily.     . Blood Glucose Monitoring Suppl (ACCU-CHEK AVIVA PLUS) W/DEVICE KIT PT NEEDS METER-STRIPS(1 BOTTLE =100/5 REFILLS)-LANCETS(1 BOX/5 REFILLS) CHECKS BS QD DX: 250.00 1 kit 0  . Calcium Carbonate Antacid (TUMS PO) Take by mouth at bedtime. RARELY WOULD NEED ADDITIONAL TUMS  DURING DAY IF SHE ATE SPICY FOODS    . colchicine 0.6 MG tablet TAKE 1 TABLET DAILY. TAKE 2 TABS IMMEDIATELY THEN 1 IN AN HOUR IF STILL HURTING 30 tablet 3  . glucose blood (ACCU-CHEK AVIVA PLUS) test strip 1 each by Other route 2 (two) times daily. Check blood sugar twice daily  Fasting in AM, 2 hrs after a meal  E11.65 200 each 3  . JANUVIA 100 MG tablet TAKE 1 TABLET EVERY DAY 90 tablet 1  . levothyroxine (SYNTHROID, LEVOTHROID) 75 MCG tablet TAKE ONE TABLET BY MOUTH ONCE DAILY BEFORE BREAKFAST 90 tablet 3  . lisinopril-hydrochlorothiazide (PRINZIDE,ZESTORETIC) 20-25 MG tablet TAKE 1 TABLET BY MOUTH DAILY 90 tablet 4  . meloxicam (MOBIC) 15 MG tablet TAKE 1 TABLET (15 MG TOTAL) BY MOUTH DAILY. 90 tablet 3  . metFORMIN (GLUCOPHAGE) 500 MG tablet TAKE 2 TABLETS BY MOUTH TWICE DAILY WITH A MEAL 360 tablet 1  . Multiple Vitamin (MULTIVITAMIN WITH MINERALS) TABS Take 1 tablet by mouth daily.    . pioglitazone (ACTOS) 30 MG tablet Take 1 tablet (30 mg total) by mouth daily. 90 tablet 1  . simvastatin (ZOCOR) 80 MG tablet TAKE 1/2 TABLET BY MOUTH AT BEDTIME 90 tablet 1  . triamcinolone ointment (KENALOG) 0.1 % Apply 1 application topically 2 (two) times daily.     No current facility-administered medications  on file prior to visit.    Allergies  Allergen Reactions  . Clindamycin/Lincomycin     CLINDAMYCIN TAKEN WITH CIPRO CAUSED SEVERE NAUSEA--PT STATES SHE CAN TAKE CIPRO ALONE WITHOUT PROBLEM--IT WAS JUST THE COMBINATION SHE DID NOT TOLERATE.  Marland Kitchen Levaquin [Levofloxacin] Nausea And Vomiting  . Metronidazole Hcl Other (See Comments)    Deathly sick WHEN PT TOOK METRONIDAZOLE WITH CIPRO.  PT STATES SHE HAS TAKEN CIPRO ALONE WITHOUT PROBLEM --JUST DID NOT TOLERATE THE COMBINATION OF BOTH DRUGS TAKEN TOGETHER.   Social History   Social History  . Marital status: Married    Spouse name: N/A  . Number of children: N/A  . Years of education: N/A   Occupational History  . Not on file.   Social  History Main Topics  . Smoking status: Never Smoker  . Smokeless tobacco: Never Used  . Alcohol use No  . Drug use: No  . Sexual activity: Not on file   Other Topics Concern  . Not on file   Social History Narrative  . No narrative on file     Review of Systems  All other systems reviewed and are negative.      Objective:   Physical Exam  Constitutional: She appears well-developed and well-nourished. No distress.  HENT:  Right Ear: External ear normal.  Left Ear: External ear normal.  Nose: Mucosal edema and rhinorrhea present.  Mouth/Throat: Oropharynx is clear and moist. No oropharyngeal exudate.  Eyes: Conjunctivae are normal.  Cardiovascular: Normal rate, regular rhythm and normal heart sounds.   No murmur heard. Pulmonary/Chest: Effort normal and breath sounds normal. No respiratory distress. She has no wheezes. She has no rales.  Abdominal: Soft. Bowel sounds are normal. She exhibits no distension. There is no tenderness. There is no rebound.  Lymphadenopathy:    She has no cervical adenopathy.  Skin: No rash noted. She is not diaphoretic.  Vitals reviewed.         Assessment & Plan:  Acute non-recurrent maxillary sinusitis - Plan: cefdinir (OMNICEF) 300 MG capsule  Patient states sounds like she has a chronic sinus infection that never completely resolved on the initial dose of amoxicillin. Therefore I will switch the patient to Omnicef 300 mg by mouth twice a day for 10 days.

## 2016-12-05 ENCOUNTER — Encounter: Payer: Self-pay | Admitting: Family Medicine

## 2016-12-05 ENCOUNTER — Ambulatory Visit (INDEPENDENT_AMBULATORY_CARE_PROVIDER_SITE_OTHER): Payer: Medicare Other | Admitting: Family Medicine

## 2016-12-05 VITALS — BP 160/74 | HR 78 | Temp 98.6°F | Resp 18 | Ht 63.0 in

## 2016-12-05 DIAGNOSIS — M25561 Pain in right knee: Secondary | ICD-10-CM | POA: Diagnosis not present

## 2016-12-05 NOTE — Progress Notes (Signed)
Subjective:    Patient ID: Whitney Morales, female    DOB: [birth year], 76 y.o.   MRN: [id number]  HPI Approximately one month ago, the patient has severe pain in her right knee without provocation lasted 4 days. The pain gradually subsided. Over the last week, the pain has returned in her right knee. There is no laxity to varus or valgus stress. She has negative anterior and posterior drawer sign. She has a negative McMurray sign. She has negative Apley grind. There is no erythema or effusion. There is no tenderness to palpation over the joint line. The pain is more of a soreness and stiffness the primarily occurs at night. Symptoms have improved since she started wearing a knee brace Past Medical History:  Diagnosis Date  . Abnormal finding on EKG 10/29/2012   FOLLOW UP NUCLEAR STRESS TEST ON 11/05/12 -NORMAL  . Corneal erosion 2012   RIGHT EYE--HAS RESOLVED--NO PROBLEMS SINCE  . Diabetes mellitus   . Diverticulitis   . Fatigue   . GERD (gastroesophageal reflux disease)    PAST HX--NO PROBLEMS NOW-PT DOES TAKE DAILY TUMS AT BEDTIME  . Goiter    cyst vs goiter on thyroid  . H/O hiatal hernia   . High cholesterol   . Hypertension   . Obesity    Past Surgical History:  Procedure Laterality Date  . ABDOMINAL HYSTERECTOMY    . BREAST LUMPECTOMY     BENIGN  . CARDIOVASCULAR STRESS TEST  06/08/2009   EF 78%, NO ISCHEMIA  . THYROID LOBECTOMY  11/27/2012   Procedure: THYROID LOBECTOMY;  Surgeon: Ralene Ok, MD;  Location: [facility];  Service: General;  Laterality: Right;  Right Thyroid Lobectomy  . TONSILLECTOMY    . US ECHOCARDIOGRAPHY  03/23/2006   EF 55-60%   Current Outpatient Prescriptions on File Prior to Visit  Medication Sig Dispense Refill  . ACCU-CHEK SOFTCLIX LANCETS lancets USE AS DIRECTED TO CHECK SUGAR 100 each 1  . amoxicillin (AMOXIL) 875 MG tablet Take 1 tablet (875 mg total) by mouth 2 (two) times daily. 20 tablet 0  . aspirin 81 MG tablet Take 81 mg by mouth daily.      . Blood Glucose Monitoring Suppl (ACCU-CHEK AVIVA PLUS) W/DEVICE KIT PT NEEDS METER-STRIPS(1 BOTTLE =100/5 REFILLS)-LANCETS(1 BOX/5 REFILLS) CHECKS BS QD DX: 250.00 1 kit 0  . Calcium Carbonate Antacid (TUMS PO) Take by mouth at bedtime. RARELY WOULD NEED ADDITIONAL TUMS DURING DAY IF SHE ATE SPICY FOODS    . cefdinir (OMNICEF) 300 MG capsule Take 1 capsule (300 mg total) by mouth 2 (two) times daily. 20 capsule 0  . colchicine 0.6 MG tablet TAKE 1 TABLET DAILY. TAKE 2 TABS IMMEDIATELY THEN 1 IN AN HOUR IF STILL HURTING 30 tablet 3  . glucose blood (ACCU-CHEK AVIVA PLUS) test strip 1 each by Other route 2 (two) times daily. Check blood sugar twice daily  Fasting in AM, 2 hrs after a meal  E11.65 200 each 3  . JANUVIA 100 MG tablet TAKE 1 TABLET EVERY DAY 90 tablet 1  . levothyroxine (SYNTHROID, LEVOTHROID) 75 MCG tablet TAKE ONE TABLET BY MOUTH ONCE DAILY BEFORE BREAKFAST 90 tablet 3  . lisinopril-hydrochlorothiazide (PRINZIDE,ZESTORETIC) 20-25 MG tablet TAKE 1 TABLET BY MOUTH DAILY 90 tablet 4  . meloxicam (MOBIC) 15 MG tablet TAKE 1 TABLET (15 MG TOTAL) BY MOUTH DAILY. 90 tablet 3  . metFORMIN (GLUCOPHAGE) 500 MG tablet TAKE 2 TABLETS BY MOUTH TWICE DAILY WITH A MEAL 360 tablet 1  .  Multiple Vitamin (MULTIVITAMIN WITH MINERALS) TABS Take 1 tablet by mouth daily.    . pioglitazone (ACTOS) 30 MG tablet Take 1 tablet (30 mg total) by mouth daily. 90 tablet 1  . simvastatin (ZOCOR) 80 MG tablet TAKE 1/2 TABLET BY MOUTH AT BEDTIME 90 tablet 1  . triamcinolone ointment (KENALOG) 0.1 % Apply 1 application topically 2 (two) times daily.     No current facility-administered medications on file prior to visit.    Allergies  Allergen Reactions  . Clindamycin/Lincomycin     CLINDAMYCIN TAKEN WITH CIPRO CAUSED SEVERE NAUSEA--PT STATES SHE CAN TAKE CIPRO ALONE WITHOUT PROBLEM--IT WAS JUST THE COMBINATION SHE DID NOT TOLERATE.  Marland Kitchen Levaquin [Levofloxacin] Nausea And Vomiting  . Metronidazole Hcl Other (See  Comments)    Deathly sick WHEN PT TOOK METRONIDAZOLE WITH CIPRO.  PT STATES SHE HAS TAKEN CIPRO ALONE WITHOUT PROBLEM --JUST DID NOT TOLERATE THE COMBINATION OF BOTH DRUGS TAKEN TOGETHER.   Social History   Social History  . Marital status: Married    Spouse name: N/A  . Number of children: N/A  . Years of education: N/A   Occupational History  . Not on file.   Social History Main Topics  . Smoking status: Never Smoker  . Smokeless tobacco: Never Used  . Alcohol use No  . Drug use: No  . Sexual activity: Not on file   Other Topics Concern  . Not on file   Social History Narrative  . No narrative on file      Review of Systems  All other systems reviewed and are negative.      Objective:   Physical Exam  Cardiovascular: Normal rate, regular rhythm and normal heart sounds.   Pulmonary/Chest: Effort normal and breath sounds normal.  Musculoskeletal:       Right knee: She exhibits normal range of motion, no LCL laxity, normal meniscus and no MCL laxity. No medial joint line, no lateral joint line, no MCL and no LCL tenderness noted.  Vitals reviewed.         Assessment & Plan:  Right knee pain, unspecified chronicity  I suspect that this is a combination of a possible strain in the right knee coupled with osteoarthritis. I recommended using meloxicam on an as-needed basis and continue to use the knee brace for comfort. Symptoms seem to be improving gradually over the last week and therefore I'll not work it up further at the present time. Should pain return, I would order an x-ray of the right knee

## 2016-12-07 ENCOUNTER — Ambulatory Visit: Payer: Medicare Other | Admitting: Family Medicine

## 2017-01-11 ENCOUNTER — Other Ambulatory Visit: Payer: Self-pay | Admitting: Family Medicine

## 2017-01-30 ENCOUNTER — Other Ambulatory Visit: Payer: Self-pay | Admitting: Family Medicine

## 2017-01-30 ENCOUNTER — Other Ambulatory Visit: Payer: Medicare Other

## 2017-01-30 DIAGNOSIS — E1165 Type 2 diabetes mellitus with hyperglycemia: Secondary | ICD-10-CM

## 2017-01-30 DIAGNOSIS — E039 Hypothyroidism, unspecified: Secondary | ICD-10-CM

## 2017-01-30 DIAGNOSIS — Z79899 Other long term (current) drug therapy: Secondary | ICD-10-CM

## 2017-01-30 DIAGNOSIS — E785 Hyperlipidemia, unspecified: Secondary | ICD-10-CM

## 2017-01-30 LAB — COMPLETE METABOLIC PANEL WITH GFR
ALT: 12 U/L (ref 6–29)
AST: 13 U/L (ref 10–35)
Albumin: 4 g/dL (ref 3.6–5.1)
Alkaline Phosphatase: 52 U/L (ref 33–130)
BUN: 17 mg/dL (ref 7–25)
CO2: 27 mmol/L (ref 20–31)
Calcium: 9.3 mg/dL (ref 8.6–10.4)
Chloride: 101 mmol/L (ref 98–110)
Creat: 1.2 mg/dL — ABNORMAL HIGH (ref 0.60–0.93)
GFR, Est African American: 52 mL/min — ABNORMAL LOW (ref >=60)
GFR, Est Non African American: 45 mL/min — ABNORMAL LOW (ref >=60)
Glucose, Bld: 141 mg/dL — ABNORMAL HIGH (ref 70–99)
Potassium: 4.7 mmol/L (ref 3.5–5.3)
Sodium: 139 mmol/L (ref 135–146)
Total Bilirubin: 0.4 mg/dL (ref 0.2–1.2)
Total Protein: 6.8 g/dL (ref 6.1–8.1)

## 2017-01-30 LAB — LIPID PANEL
Cholesterol: 140 mg/dL (ref <200)
HDL: 44 mg/dL — ABNORMAL LOW (ref >50)
LDL Cholesterol: 72 mg/dL (ref <100)
Total CHOL/HDL Ratio: 3.2 Ratio (ref <5.0)
Triglycerides: 120 mg/dL (ref <150)
VLDL: 24 mg/dL (ref <30)

## 2017-01-30 LAB — TSH: TSH: 3.5 mIU/L

## 2017-01-31 LAB — HEMOGLOBIN A1C
Hgb A1c MFr Bld: 6.1 % — ABNORMAL HIGH
Mean Plasma Glucose: 128 mg/dL

## 2017-02-02 ENCOUNTER — Ambulatory Visit (INDEPENDENT_AMBULATORY_CARE_PROVIDER_SITE_OTHER): Payer: Medicare Other | Admitting: Family Medicine

## 2017-02-02 ENCOUNTER — Encounter: Payer: Self-pay | Admitting: Family Medicine

## 2017-02-02 VITALS — BP 146/74 | HR 78 | Temp 98.2°F | Resp 18 | Ht 63.0 in | Wt 236.0 lb

## 2017-02-02 DIAGNOSIS — E038 Other specified hypothyroidism: Secondary | ICD-10-CM

## 2017-02-02 DIAGNOSIS — N183 Chronic kidney disease, stage 3 unspecified: Secondary | ICD-10-CM

## 2017-02-02 DIAGNOSIS — I10 Essential (primary) hypertension: Secondary | ICD-10-CM | POA: Diagnosis not present

## 2017-02-02 DIAGNOSIS — E7800 Pure hypercholesterolemia, unspecified: Secondary | ICD-10-CM

## 2017-02-02 DIAGNOSIS — E1121 Type 2 diabetes mellitus with diabetic nephropathy: Secondary | ICD-10-CM

## 2017-02-02 NOTE — Progress Notes (Signed)
Subjective:    Patient ID: Whitney Morales, female    DOB: August 04, 1941, 76 y.o.   MRN: [id number]  Medication Refill   Patient is here today for follow-up. Last week had a gout exacerbation in her left foot over the fourth toe. This responded to colchicine and pain is much better. Her blood pressure today is elevated however she assures me that her blood pressure is less than 140/90 at home. She denies any chest pain shortness of breath or dyspnea on exertion. Diabetic foot exam today is normal with no evidence of peripheral vascular disease or neuropathy aside from the swelling in the left fourth toe. Recommended annual diabetic eye exam. She denies any myalgias or right upper quadrant pain. Most recent lab work as listed below: Appointment on 01/30/2017  Component Date Value Ref Range Status  . TSH 01/30/2017 3.50  mIU/L Final   Comment:   Reference Range   > or = 20 Years  0.40-4.50   Pregnancy Range First trimester  0.26-2.66 Second trimester 0.55-2.73 Third trimester  0.43-2.91     . Cholesterol 01/30/2017 140  <200 mg/dL Final  . Triglycerides 01/30/2017 120  <150 mg/dL Final  . HDL 01/30/2017 44* >50 mg/dL Final  . Total CHOL/HDL Ratio 01/30/2017 3.2  <5.0 Ratio Final  . VLDL 01/30/2017 24  <30 mg/dL Final  . LDL Cholesterol 01/30/2017 72  <100 mg/dL Final  . Sodium 01/30/2017 139  135 - 146 mmol/L Final  . Potassium 01/30/2017 4.7  3.5 - 5.3 mmol/L Final  . Chloride 01/30/2017 101  98 - 110 mmol/L Final  . CO2 01/30/2017 27  20 - 31 mmol/L Final  . Glucose, Bld 01/30/2017 141* 70 - 99 mg/dL Final  . BUN 01/30/2017 17  7 - 25 mg/dL Final  . Creat 01/30/2017 1.20* 0.60 - 0.93 mg/dL Final   Comment:   For patients > or = 74 years of age: The upper reference limit for Creatinine is approximately 13% higher for people identified as African-American.     . Total Bilirubin 01/30/2017 0.4  0.2 - 1.2 mg/dL Final  . Alkaline Phosphatase 01/30/2017 52  33 - 130 U/L Final  .  AST 01/30/2017 13  10 - 35 U/L Final  . ALT 01/30/2017 12  6 - 29 U/L Final  . Total Protein 01/30/2017 6.8  6.1 - 8.1 g/dL Final  . Albumin 01/30/2017 4.0  3.6 - 5.1 g/dL Final  . Calcium 01/30/2017 9.3  8.6 - 10.4 mg/dL Final  . GFR, Est African American 01/30/2017 52* >=60 mL/min Final  . GFR, Est Non African American 01/30/2017 45* >=60 mL/min Final  . Hgb A1c MFr Bld 01/30/2017 6.1* <5.7 % Final   Comment:   For someone without known diabetes, a hemoglobin A1c value between 5.7% and 6.4% is consistent with prediabetes and should be confirmed with a follow-up test.   For someone with known diabetes, a value <7% indicates that their diabetes is well controlled. A1c targets should be individualized based on duration of diabetes, age, co-morbid conditions and other considerations.   This assay result is consistent with an increased risk of diabetes.   Currently, no consensus exists regarding use of hemoglobin A1c for diagnosis of diabetes in children.     . Mean Plasma Glucose 01/30/2017 128  mg/dL Final    Past Medical History:  Diagnosis Date  . Abnormal finding on EKG 10/29/2012   FOLLOW UP NUCLEAR STRESS TEST ON 11/05/12 -NORMAL  . Corneal erosion  2012   RIGHT EYE--HAS RESOLVED--NO PROBLEMS SINCE  . Diabetes mellitus   . Diverticulitis   . Fatigue   . GERD (gastroesophageal reflux disease)    PAST HX--NO PROBLEMS NOW-PT DOES TAKE DAILY TUMS AT BEDTIME  . Goiter    cyst vs goiter on thyroid  . H/O hiatal hernia   . High cholesterol   . Hypertension   . Obesity    Past Surgical History:  Procedure Laterality Date  . ABDOMINAL HYSTERECTOMY    . BREAST LUMPECTOMY     BENIGN  . CARDIOVASCULAR STRESS TEST  06/08/2009   EF 78%, NO ISCHEMIA  . THYROID LOBECTOMY  11/27/2012   Procedure: THYROID LOBECTOMY;  Surgeon: Ralene Ok, MD;  Location: [facility];  Service: General;  Laterality: Right;  Right Thyroid Lobectomy  . TONSILLECTOMY    . US ECHOCARDIOGRAPHY  03/23/2006    EF 55-60%   Current Outpatient Prescriptions on File Prior to Visit  Medication Sig Dispense Refill  . ACCU-CHEK SOFTCLIX LANCETS lancets USE AS DIRECTED TO CHECK SUGAR 100 each 1  . Blood Glucose Monitoring Suppl (ACCU-CHEK AVIVA PLUS) W/DEVICE KIT PT NEEDS METER-STRIPS(1 BOTTLE =100/5 REFILLS)-LANCETS(1 BOX/5 REFILLS) CHECKS BS QD DX: 250.00 1 kit 0  . Calcium Carbonate Antacid (TUMS PO) Take by mouth at bedtime. RARELY WOULD NEED ADDITIONAL TUMS DURING DAY IF SHE ATE SPICY FOODS    . colchicine 0.6 MG tablet TAKE 1 TABLET DAILY. TAKE 2 TABS IMMEDIATELY THEN 1 IN AN HOUR IF STILL HURTING 30 tablet 3  . glucose blood (ACCU-CHEK AVIVA PLUS) test strip 1 each by Other route 2 (two) times daily. Check blood sugar twice daily  Fasting in AM, 2 hrs after a meal  E11.65 200 each 3  . JANUVIA 100 MG tablet TAKE 1 TABLET EVERY DAY 90 tablet 3  . levothyroxine (SYNTHROID, LEVOTHROID) 75 MCG tablet TAKE ONE TABLET BY MOUTH ONCE DAILY BEFORE BREAKFAST 90 tablet 3  . lisinopril-hydrochlorothiazide (PRINZIDE,ZESTORETIC) 20-25 MG tablet TAKE 1 TABLET BY MOUTH DAILY 90 tablet 4  . meloxicam (MOBIC) 15 MG tablet TAKE 1 TABLET (15 MG TOTAL) BY MOUTH DAILY. 90 tablet 3  . metFORMIN (GLUCOPHAGE) 500 MG tablet TAKE 2 TABLETS BY MOUTH TWICE DAILY WITH A MEAL 360 tablet 1  . Multiple Vitamin (MULTIVITAMIN WITH MINERALS) TABS Take 1 tablet by mouth daily.    . pioglitazone (ACTOS) 30 MG tablet Take 1 tablet (30 mg total) by mouth daily. 90 tablet 1  . simvastatin (ZOCOR) 80 MG tablet TAKE 1/2 TABLET BY MOUTH AT BEDTIME 90 tablet 1  . triamcinolone ointment (KENALOG) 0.1 % Apply 1 application topically 2 (two) times daily.     No current facility-administered medications on file prior to visit.    Allergies  Allergen Reactions  . Clindamycin/Lincomycin     CLINDAMYCIN TAKEN WITH CIPRO CAUSED SEVERE NAUSEA--PT STATES SHE CAN TAKE CIPRO ALONE WITHOUT PROBLEM--IT WAS JUST THE COMBINATION SHE DID NOT TOLERATE.  Marland Kitchen  Levaquin [Levofloxacin] Nausea And Vomiting  . Metronidazole Hcl Other (See Comments)    Deathly sick WHEN PT TOOK METRONIDAZOLE WITH CIPRO.  PT STATES SHE HAS TAKEN CIPRO ALONE WITHOUT PROBLEM --JUST DID NOT TOLERATE THE COMBINATION OF BOTH DRUGS TAKEN TOGETHER.   Social History   Social History  . Marital status: Married    Spouse name: N/A  . Number of children: N/A  . Years of education: N/A   Occupational History  . Not on file.   Social History Main Topics  . Smoking  status: Never Smoker  . Smokeless tobacco: Never Used  . Alcohol use No  . Drug use: No  . Sexual activity: Not on file   Other Topics Concern  . Not on file   Social History Narrative  . No narrative on file   No family history on file.     Review of Systems  All other systems reviewed and are negative.      Objective:   Physical Exam  Constitutional: She appears well-developed and well-nourished. No distress.  Neck: Neck supple. No thyromegaly present.  Cardiovascular: Normal rate, regular rhythm, normal heart sounds and intact distal pulses.   No murmur heard. Pulmonary/Chest: Effort normal and breath sounds normal. No respiratory distress. She has no wheezes. She has no rales.  Abdominal: Soft. Bowel sounds are normal. She exhibits no distension. There is no tenderness. There is no rebound.  Musculoskeletal: She exhibits no edema.  Skin: She is not diaphoretic.  Vitals reviewed.         Assessment & Plan:  DMII HTN HLD CKD 3  Diabetes is well controlled. Discontinue Actos. Cholesterol is excellent. Thyroid is appropriately treated. Patient does have stage III chronic kidney disease. I recommended discontinuation of meloxicam. I recommended she drink more fluids. I recommended that she monitor her blood pressure more closely at home so that we can keep her blood pressure less than 140/90 and preferably 130/80.

## 2017-02-06 ENCOUNTER — Telehealth: Payer: Self-pay | Admitting: Family Medicine

## 2017-02-06 MED ORDER — PREDNISONE 20 MG PO TABS: ORAL_TABLET | ORAL | 0 refills | Status: DC

## 2017-02-06 NOTE — Telephone Encounter (Signed)
Pt called and states that her toe is no better and she states that the last time we sent her in some prednisone and that seemed to work and was wondering if she could try that again.   Per [facility] ok to send in taper pack.   Medication called/sent to requested pharmacy and pt aware

## 2017-02-08 ENCOUNTER — Other Ambulatory Visit: Payer: Self-pay | Admitting: Family Medicine

## 2017-02-23 ENCOUNTER — Telehealth: Payer: Self-pay | Admitting: Family Medicine

## 2017-02-23 NOTE — Telephone Encounter (Signed)
Pt called [facility] stating that her BP's are doing good with a few highs in 140's - 150's. Her BS has been in the 120's - 130's and her toes is still swollen and red and the pred did not help at all. She just wanted to give you an update.

## 2017-02-26 ENCOUNTER — Ambulatory Visit
Admission: RE | Admit: 2017-02-26 | Discharge: 2017-02-26 | Disposition: A | Discharge status: Home / Self Care | Payer: Medicare Other | Source: Ambulatory Visit | Attending: Family Medicine | Admitting: Family Medicine

## 2017-02-26 ENCOUNTER — Encounter: Payer: Self-pay | Admitting: Family Medicine

## 2017-02-26 ENCOUNTER — Ambulatory Visit (INDEPENDENT_AMBULATORY_CARE_PROVIDER_SITE_OTHER): Payer: Medicare Other | Admitting: Family Medicine

## 2017-02-26 VITALS — BP 140/60 | HR 82 | Temp 98.6°F | Resp 20 | Ht 63.0 in

## 2017-02-26 DIAGNOSIS — M109 Gout, unspecified: Secondary | ICD-10-CM | POA: Diagnosis not present

## 2017-02-26 DIAGNOSIS — G8929 Other chronic pain: Secondary | ICD-10-CM

## 2017-02-26 DIAGNOSIS — M79675 Pain in left toe(s): Secondary | ICD-10-CM | POA: Diagnosis not present

## 2017-02-26 NOTE — Progress Notes (Signed)
Subjective:    Patient ID: Whitney Morales, female    DOB: 1941-09-17, 76 y.o.   MRN: [id number]  Medication Refill   When I saw the patient in April, she had just had a gout exacerbation in the left fourth toe DIP joint. The pain has improved however it was still slightly erythematous and swollen. She is here today for recheck. The left fourth toe distal to the IP joint is erythematous although it is a more violaceous color and not a bright red erythema. It is still swollen distal to the IP joint. There is no pain. There is no warmth. Otherwise there is no symptoms of an infection. It appears that she may be developing tophaceous gout although I cannot exclude osteomyelitis in a diabetic patient. However she has no history of diabetic neuropathy and this is not painful today. She also has this acute onset of severe pain in her right midfoot. This is erythematous warm and tender to the touch at the tarsal/metatarsal row. There is some swelling in that area consistent with acute gout.  Past Medical History:  Diagnosis Date  . Abnormal finding on EKG 10/29/2012   FOLLOW UP NUCLEAR STRESS TEST ON 11/05/12 -NORMAL  . Corneal erosion 2012   RIGHT EYE--HAS RESOLVED--NO PROBLEMS SINCE  . Diabetes mellitus   . Diverticulitis   . Fatigue   . GERD (gastroesophageal reflux disease)    PAST HX--NO PROBLEMS NOW-PT DOES TAKE DAILY TUMS AT BEDTIME  . Goiter    cyst vs goiter on thyroid  . H/O hiatal hernia   . High cholesterol   . Hypertension   . Obesity    Past Surgical History:  Procedure Laterality Date  . ABDOMINAL HYSTERECTOMY    . BREAST LUMPECTOMY     BENIGN  . CARDIOVASCULAR STRESS TEST  06/08/2009   EF 78%, NO ISCHEMIA  . THYROID LOBECTOMY  11/27/2012   Procedure: THYROID LOBECTOMY;  Surgeon: Ralene Ok, MD;  Location: [facility];  Service: General;  Laterality: Right;  Right Thyroid Lobectomy  . TONSILLECTOMY    . US ECHOCARDIOGRAPHY  03/23/2006   EF 55-60%   Current Outpatient  Prescriptions on File Prior to Visit  Medication Sig Dispense Refill  . ACCU-CHEK SOFTCLIX LANCETS lancets USE AS DIRECTED TO CHECK SUGAR 100 each 1  . Blood Glucose Monitoring Suppl (ACCU-CHEK AVIVA PLUS) W/DEVICE KIT PT NEEDS METER-STRIPS(1 BOTTLE =100/5 REFILLS)-LANCETS(1 BOX/5 REFILLS) CHECKS BS QD DX: 250.00 1 kit 0  . Calcium Carbonate Antacid (TUMS PO) Take by mouth at bedtime. RARELY WOULD NEED ADDITIONAL TUMS DURING DAY IF SHE ATE SPICY FOODS    . colchicine 0.6 MG tablet TAKE 1 TABLET DAILY. TAKE 2 TABS IMMEDIATELY THEN 1 IN AN HOUR IF STILL HURTING 30 tablet 3  . glucose blood (ACCU-CHEK AVIVA PLUS) test strip 1 each by Other route 2 (two) times daily. Check blood sugar twice daily  Fasting in AM, 2 hrs after a meal  E11.65 200 each 3  . JANUVIA 100 MG tablet TAKE 1 TABLET EVERY DAY 90 tablet 3  . levothyroxine (SYNTHROID, LEVOTHROID) 75 MCG tablet TAKE ONE TABLET BY MOUTH ONCE DAILY BEFORE BREAKFAST 90 tablet 3  . lisinopril-hydrochlorothiazide (PRINZIDE,ZESTORETIC) 20-25 MG tablet TAKE 1 TABLET BY MOUTH DAILY 90 tablet 4  . meloxicam (MOBIC) 15 MG tablet TAKE 1 TABLET (15 MG TOTAL) BY MOUTH DAILY. 90 tablet 3  . metFORMIN (GLUCOPHAGE) 500 MG tablet TAKE 2 TABLETS BY MOUTH TWICE DAILY WITH A MEAL 360 tablet  1  . Multiple Vitamin (MULTIVITAMIN WITH MINERALS) TABS Take 1 tablet by mouth daily.    . pioglitazone (ACTOS) 30 MG tablet TAKE 1 TABLET EVERY DAY 90 tablet 1  . simvastatin (ZOCOR) 80 MG tablet TAKE 1/2 TABLET BY MOUTH AT BEDTIME 90 tablet 1  . triamcinolone ointment (KENALOG) 0.1 % Apply 1 application topically 2 (two) times daily.     No current facility-administered medications on file prior to visit.    Allergies  Allergen Reactions  . Clindamycin/Lincomycin     CLINDAMYCIN TAKEN WITH CIPRO CAUSED SEVERE NAUSEA--PT STATES SHE CAN TAKE CIPRO ALONE WITHOUT PROBLEM--IT WAS JUST THE COMBINATION SHE DID NOT TOLERATE.  Marland Kitchen Levaquin [Levofloxacin] Nausea And Vomiting  .  Metronidazole Hcl Other (See Comments)    Deathly sick WHEN PT TOOK METRONIDAZOLE WITH CIPRO.  PT STATES SHE HAS TAKEN CIPRO ALONE WITHOUT PROBLEM --JUST DID NOT TOLERATE THE COMBINATION OF BOTH DRUGS TAKEN TOGETHER.   Social History   Social History  . Marital status: Married    Spouse name: N/A  . Number of children: N/A  . Years of education: N/A   Occupational History  . Not on file.   Social History Main Topics  . Smoking status: Never Smoker  . Smokeless tobacco: Never Used  . Alcohol use No  . Drug use: No  . Sexual activity: Not on file   Other Topics Concern  . Not on file   Social History Narrative  . No narrative on file   No family history on file.     Review of Systems  All other systems reviewed and are negative.      Objective:   Physical Exam  Constitutional: She appears well-developed and well-nourished. No distress.  Neck: Neck supple. No thyromegaly present.  Cardiovascular: Normal rate, regular rhythm, normal heart sounds and intact distal pulses.   No murmur heard. Pulmonary/Chest: Effort normal and breath sounds normal. No respiratory distress. She has no wheezes. She has no rales.  Abdominal: Soft. Bowel sounds are normal. She exhibits no distension. There is no tenderness. There is no rebound.  Musculoskeletal: She exhibits no edema.       Right foot: There is tenderness, bony tenderness and swelling.       Left foot: There is swelling. There is no tenderness and no bony tenderness.       Feet:  Skin: She is not diaphoretic.  Vitals reviewed.         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 Treat the acute gout with colchicine 2 tablets immediately and repeat an additional tablet in 1 hour if pain  persists. Repeat this pattern every day until pain is better. Proceed with an x-ray of the left foot to rule out changes of osteomyelitis of the toe. I suspect however that if we can lower her serum uric acid level with allopurinol, the toe will gradually improve. Therefore I would like the patient to call me as soon as the pain has dissipated in the right foot so we can begin her immediately on allopurinol 200 mg a day in addition to colchicine 0.6 mg a day and then recheck a uric acid level in 8 weeks. I suspect that is the uric acid  level drops, the swelling will subside in the left fourth toe. Obviously if there are  x-ray abnormalities of the left fourth toe consistent with osteomyelitis, the patient will require surgical consultation with a podiatrist

## 2017-04-05 ENCOUNTER — Other Ambulatory Visit: Payer: Self-pay | Admitting: Family Medicine

## 2017-04-05 MED ORDER — SITAGLIPTIN PHOSPHATE 100 MG PO TABS: 100.0000 mg | ORAL_TABLET | Freq: Every day | ORAL | 0 refills | Status: DC

## 2017-04-05 MED ORDER — SITAGLIPTIN PHOSPHATE 100 MG PO TABS
100.0000 mg | ORAL_TABLET | Freq: Every day | ORAL | 0 refills | Status: DC
Start: 2017-04-05 — End: 2017-04-05

## 2017-04-05 NOTE — Addendum Note (Signed)
Addended by: Shary Decamp B on: 04/05/2017 11:40 AM   Modules accepted: Orders

## 2017-05-05 ENCOUNTER — Other Ambulatory Visit: Payer: Self-pay | Admitting: Family Medicine

## 2017-05-08 ENCOUNTER — Other Ambulatory Visit: Payer: Self-pay | Admitting: Family Medicine

## 2017-05-17 ENCOUNTER — Other Ambulatory Visit: Payer: Self-pay | Admitting: Family Medicine

## 2017-05-28 LAB — HM MAMMOGRAPHY

## 2017-07-25 ENCOUNTER — Other Ambulatory Visit: Payer: Medicare Other

## 2017-07-25 ENCOUNTER — Other Ambulatory Visit: Payer: Self-pay | Admitting: Family Medicine

## 2017-07-25 DIAGNOSIS — I10 Essential (primary) hypertension: Secondary | ICD-10-CM

## 2017-07-25 DIAGNOSIS — E039 Hypothyroidism, unspecified: Secondary | ICD-10-CM

## 2017-07-25 DIAGNOSIS — E1165 Type 2 diabetes mellitus with hyperglycemia: Secondary | ICD-10-CM

## 2017-07-25 DIAGNOSIS — Z79899 Other long term (current) drug therapy: Secondary | ICD-10-CM

## 2017-07-25 DIAGNOSIS — E785 Hyperlipidemia, unspecified: Secondary | ICD-10-CM

## 2017-07-26 LAB — HEMOGLOBIN A1C
Hgb A1c MFr Bld: 8.3 % of total Hgb — ABNORMAL HIGH (ref <5.7)
Mean Plasma Glucose: 192 (calc)
eAG (mmol/L): 10.6 (calc)

## 2017-07-26 LAB — LIPID PANEL
Cholesterol: 135 mg/dL (ref <200)
HDL: 32 mg/dL — ABNORMAL LOW (ref >50)
LDL Cholesterol (Calc): 80 mg/dL (calc)
Non-HDL Cholesterol (Calc): 103 mg/dL (calc) (ref <130)
Total CHOL/HDL Ratio: 4.2 (calc) (ref <5.0)
Triglycerides: 136 mg/dL (ref <150)

## 2017-07-26 LAB — CBC WITH DIFFERENTIAL/PLATELET
Basophils Absolute: 69 cells/uL (ref 0–200)
Basophils Relative: 0.8 %
Eosinophils Absolute: 129 cells/uL (ref 15–500)
Eosinophils Relative: 1.5 %
HCT: 36.7 % (ref 35.0–45.0)
Hemoglobin: 12 g/dL (ref 11.7–15.5)
Lymphs Abs: 2227 cells/uL (ref 850–3900)
MCH: 29.1 pg (ref 27.0–33.0)
MCHC: 32.7 g/dL (ref 32.0–36.0)
MCV: 88.9 fL (ref 80.0–100.0)
MPV: 10.5 fL (ref 7.5–12.5)
Monocytes Relative: 8 %
Neutro Abs: 5487 cells/uL (ref 1500–7800)
Neutrophils Relative %: 63.8 %
Platelets: 315 10*3/uL (ref 140–400)
RBC: 4.13 10*6/uL (ref 3.80–5.10)
RDW: 12 % (ref 11.0–15.0)
Total Lymphocyte: 25.9 %
WBC mixed population: 688 cells/uL (ref 200–950)
WBC: 8.6 10*3/uL (ref 3.8–10.8)

## 2017-07-26 LAB — COMPLETE METABOLIC PANEL WITH GFR
AG Ratio: 1.6 (calc) (ref 1.0–2.5)
ALT: 14 U/L (ref 6–29)
AST: 13 U/L (ref 10–35)
Albumin: 4 g/dL (ref 3.6–5.1)
Alkaline phosphatase (APISO): 61 U/L (ref 33–130)
BUN/Creatinine Ratio: 14 (calc) (ref 6–22)
BUN: 14 mg/dL (ref 7–25)
CO2: 28 mmol/L (ref 20–32)
Calcium: 9.4 mg/dL (ref 8.6–10.4)
Chloride: 103 mmol/L (ref 98–110)
Creat: 1.01 mg/dL — ABNORMAL HIGH (ref 0.60–0.93)
GFR, Est African American: 64 mL/min/{1.73_m2} (ref >=60)
GFR, Est Non African American: 55 mL/min/{1.73_m2} — ABNORMAL LOW (ref >=60)
Globulin: 2.5 g/dL (calc) (ref 1.9–3.7)
Glucose, Bld: 224 mg/dL — ABNORMAL HIGH (ref 65–99)
Potassium: 5 mmol/L (ref 3.5–5.3)
Sodium: 140 mmol/L (ref 135–146)
Total Bilirubin: 0.3 mg/dL (ref 0.2–1.2)
Total Protein: 6.5 g/dL (ref 6.1–8.1)

## 2017-07-26 LAB — MICROALBUMIN / CREATININE URINE RATIO
Creatinine, Urine: 201 mg/dL (ref 20–275)
Microalb Creat Ratio: 11 mcg/mg creat (ref <30)
Microalb, Ur: 2.2 mg/dL

## 2017-07-26 LAB — TSH: TSH: 2.95 mIU/L (ref 0.40–4.50)

## 2017-07-27 ENCOUNTER — Ambulatory Visit: Payer: Medicare Other | Admitting: Family Medicine

## 2017-07-30 ENCOUNTER — Encounter: Payer: Self-pay | Admitting: Family Medicine

## 2017-07-31 ENCOUNTER — Encounter: Payer: Self-pay | Admitting: Family Medicine

## 2017-07-31 ENCOUNTER — Ambulatory Visit (INDEPENDENT_AMBULATORY_CARE_PROVIDER_SITE_OTHER): Payer: Medicare Other | Admitting: Family Medicine

## 2017-07-31 VITALS — BP 138/78 | HR 85 | Temp 98.0°F | Resp 14 | Ht 63.0 in | Wt 226.0 lb

## 2017-07-31 DIAGNOSIS — Z23 Encounter for immunization: Secondary | ICD-10-CM

## 2017-07-31 DIAGNOSIS — M79675 Pain in left toe(s): Secondary | ICD-10-CM

## 2017-07-31 DIAGNOSIS — G8929 Other chronic pain: Secondary | ICD-10-CM

## 2017-07-31 DIAGNOSIS — E038 Other specified hypothyroidism: Secondary | ICD-10-CM | POA: Diagnosis not present

## 2017-07-31 DIAGNOSIS — I10 Essential (primary) hypertension: Secondary | ICD-10-CM | POA: Diagnosis not present

## 2017-07-31 DIAGNOSIS — E7800 Pure hypercholesterolemia, unspecified: Secondary | ICD-10-CM | POA: Diagnosis not present

## 2017-07-31 DIAGNOSIS — E1121 Type 2 diabetes mellitus with diabetic nephropathy: Secondary | ICD-10-CM | POA: Diagnosis not present

## 2017-07-31 MED ORDER — PIOGLITAZONE HCL 30 MG PO TABS: 30.0000 mg | ORAL_TABLET | Freq: Every day | ORAL | 5 refills | Status: DC

## 2017-07-31 MED ORDER — ALLOPURINOL 100 MG PO TABS: 200.0000 mg | ORAL_TABLET | Freq: Every day | ORAL | 5 refills | Status: DC

## 2017-07-31 NOTE — Progress Notes (Signed)
Subjective:    Patient ID: Whitney Morales, female    DOB: [birth year], 76 y.o.   MRN: [id number]  Medication Refill   01/2017 Patient is here today for follow-up. Last week had a gout exacerbation in her left foot over the fourth toe. This responded to colchicine and pain is much better. Her blood pressure today is elevated however she assures me that her blood pressure is less than 140/90 at home. She denies any chest pain shortness of breath or dyspnea on exertion. Diabetic foot exam today is normal with no evidence of peripheral vascular disease or neuropathy aside from the swelling in the left fourth toe. Recommended annual diabetic eye exam. She denies any myalgias or right upper quadrant pain. At that time, my plan was: Diabetes is well controlled. Discontinue Actos. Cholesterol is excellent. Thyroid is appropriately treated. Patient does have stage III chronic kidney disease. I recommended discontinuation of meloxicam. I recommended she drink more fluids. I recommended that she monitor her blood pressure more closely at home so that we can keep her blood pressure less than 140/90 and preferably 130/80.  07/30/17 Patient is here today for follow-up. Since discontinuing Actos, her hemoglobin A1c has risen substantially to 8.3. Her blood sugars are out of control and are greater than 250. She admits to dietary indiscretion and she has not been exercising. Her blood pressure today is excellent at 138/78. She denies any chest pain shortness of breath or dyspnea on exertion. Her TSH shows that her dosage of levothyroxine is appropriately titrated. Her cholesterol is excellent. Her LDL cholesterol is well below her goal of 100. She denies any myalgias or right upper quadrant pain. However she does continue have swelling in the PIP joint on her left fourth toe. It is pink and swollen. There is no pain. There is no warmth. X-rays of the negative. We have the operating under the assumption that the patient  has chronic tophaceous gout. She is not currently on any prophylactic medication.given he length of . She is now interested in starting prophylactic medication including allopurinoltime the patient has had th problem, I do not suspect ostomyeitis Appointment on 07/25/2017  Component Date Value Ref Range Status  . TSH 07/25/2017 2.95  0.40 - 4.50 mIU/L Final  . Creatinine, Urine 07/25/2017 201  20 - 275 mg/dL Final  . Microalb, Ur 07/25/2017 2.2  mg/dL Final   Comment: Reference Range Not established   . Microalb Creat Ratio 07/25/2017 11  <30 mcg/mg creat Final   Comment: . The ADA defines abnormalities in albumin excretion as follows: Marland Kitchen Category         Result (mcg/mg creatinine) . Normal                    <30 Microalbuminuria         30-299  Clinical albuminuria   > OR = 300 . The ADA recommends that at least two of three specimens collected within a 3-6 month period be abnormal before considering a patient to be within a diagnostic category.   . Hgb A1c MFr Bld 07/25/2017 8.3* <5.7 % of total Hgb Final   Comment: For someone without known diabetes, a hemoglobin A1c value of 6.5% or greater indicates that they may have  diabetes and this should be confirmed with a follow-up  test. . For someone with known diabetes, a value <7% indicates  that their diabetes is well controlled and a value  greater than or equal to 7%  indicates suboptimal  control. A1c targets should be individualized based on  duration of diabetes, age, comorbid conditions, and  other considerations. . Currently, no consensus exists regarding use of hemoglobin A1c for diagnosis of diabetes for children. .   . Mean Plasma Glucose 07/25/2017 192  (calc) Final  . eAG (mmol/L) 07/25/2017 10.6  (calc) Final  . WBC 07/25/2017 8.6  3.8 - 10.8 Thousand/uL Final  . RBC 07/25/2017 4.13  3.80 - 5.10 Million/uL Final  . Hemoglobin 07/25/2017 12.0  11.7 - 15.5 g/dL Final  . HCT 07/25/2017 36.7  35.0 - 45.0 % Final   . MCV 07/25/2017 88.9  80.0 - 100.0 fL Final  . MCH 07/25/2017 29.1  27.0 - 33.0 pg Final  . MCHC 07/25/2017 32.7  32.0 - 36.0 g/dL Final  . RDW 07/25/2017 12.0  11.0 - 15.0 % Final  . Platelets 07/25/2017 315  140 - 400 Thousand/uL Final  . MPV 07/25/2017 10.5  7.5 - 12.5 fL Final  . Neutro Abs 07/25/2017 5487  1,500 - 7,800 cells/uL Final  . Lymphs Abs 07/25/2017 2227  850 - 3,900 cells/uL Final  . WBC mixed population 07/25/2017 688  200 - 950 cells/uL Final  . Eosinophils Absolute 07/25/2017 129  15 - 500 cells/uL Final  . Basophils Absolute 07/25/2017 69  0 - 200 cells/uL Final  . Neutrophils Relative % 07/25/2017 63.8  % Final  . Total Lymphocyte 07/25/2017 25.9  % Final  . Monocytes Relative 07/25/2017 8.0  % Final  . Eosinophils Relative 07/25/2017 1.5  % Final  . Basophils Relative 07/25/2017 0.8  % Final  . Cholesterol 07/25/2017 135  <200 mg/dL Final  . HDL 07/25/2017 32* >50 mg/dL Final  . Triglycerides 07/25/2017 136  <150 mg/dL Final  . LDL Cholesterol (Calc) 07/25/2017 80  mg/dL (calc) Final   Comment: Reference range: <100 . Desirable range <100 mg/dL for primary prevention;   <70 mg/dL for patients with CHD or diabetic patients  with > or = 2 CHD risk factors. Marland Kitchen LDL-C is now calculated using the Martin-Hopkins  calculation, which is a validated novel method providing  better accuracy than the Friedewald equation in the  estimation of LDL-C.  Cresenciano Genre et al. Annamaria Helling. 0370;488(89): 2061-2068  (http://education.[employer].com/faq/FAQ164)   . Total CHOL/HDL Ratio 07/25/2017 4.2  <5.0 (calc) Final  . Non-HDL Cholesterol (Calc) 07/25/2017 103  <130 mg/dL (calc) Final   Comment: For patients with diabetes plus 1 major ASCVD risk  factor, treating to a non-HDL-C goal of <100 mg/dL  (LDL-C of <70 mg/dL) is considered a therapeutic  option.   . Glucose, Bld 07/25/2017 224* 65 - 99 mg/dL Final   Comment: .            Fasting reference interval . For someone  without known diabetes, a glucose value >125 mg/dL indicates that they may have diabetes and this should be confirmed with a follow-up test. .   . BUN 07/25/2017 14  7 - 25 mg/dL Final  . Creat 07/25/2017 1.01* 0.60 - 0.93 mg/dL Final   Comment: For patients >52 years of age, the reference limit for Creatinine is approximately 13% higher for people identified as African-American. .   . GFR, Est Non African American 07/25/2017 55* > OR = 60 mL/min/1.45m Final  . GFR, Est African American 07/25/2017 64  > OR = 60 mL/min/1.760mFinal  . BUN/Creatinine Ratio 07/25/2017 14  6 - 22 (calc) Final  . Sodium 07/25/2017 140  135 -  146 mmol/L Final  . Potassium 07/25/2017 5.0  3.5 - 5.3 mmol/L Final  . Chloride 07/25/2017 103  98 - 110 mmol/L Final  . CO2 07/25/2017 28  20 - 32 mmol/L Final  . Calcium 07/25/2017 9.4  8.6 - 10.4 mg/dL Final  . Total Protein 07/25/2017 6.5  6.1 - 8.1 g/dL Final  . Albumin 07/25/2017 4.0  3.6 - 5.1 g/dL Final  . Globulin 07/25/2017 2.5  1.9 - 3.7 g/dL (calc) Final  . AG Ratio 07/25/2017 1.6  1.0 - 2.5 (calc) Final  . Total Bilirubin 07/25/2017 0.3  0.2 - 1.2 mg/dL Final  . Alkaline phosphatase (APISO) 07/25/2017 61  33 - 130 U/L Final  . AST 07/25/2017 13  10 - 35 U/L Final  . ALT 07/25/2017 14  6 - 29 U/L Final    Past Medical History:  Diagnosis Date  . Abnormal finding on EKG 10/29/2012   FOLLOW UP NUCLEAR STRESS TEST ON 11/05/12 -NORMAL  . Corneal erosion 2012   RIGHT EYE--HAS RESOLVED--NO PROBLEMS SINCE  . Diabetes mellitus   . Diverticulitis   . Fatigue   . GERD (gastroesophageal reflux disease)    PAST HX--NO PROBLEMS NOW-PT DOES TAKE DAILY TUMS AT BEDTIME  . Goiter    cyst vs goiter on thyroid  . H/O hiatal hernia   . High cholesterol   . Hypertension   . Obesity    Past Surgical History:  Procedure Laterality Date  . ABDOMINAL HYSTERECTOMY    . BREAST LUMPECTOMY     BENIGN  . CARDIOVASCULAR STRESS TEST  06/08/2009   EF 78%, NO ISCHEMIA    . THYROID LOBECTOMY  11/27/2012   Procedure: THYROID LOBECTOMY;  Surgeon: Ralene Ok, MD;  Location: [facility];  Service: General;  Laterality: Right;  Right Thyroid Lobectomy  . TONSILLECTOMY    . US ECHOCARDIOGRAPHY  03/23/2006   EF 55-60%   Current Outpatient Prescriptions on File Prior to Visit  Medication Sig Dispense Refill  . ACCU-CHEK SOFTCLIX LANCETS lancets USE AS DIRECTED TO CHECK SUGAR 100 each 1  . Blood Glucose Monitoring Suppl (ACCU-CHEK AVIVA PLUS) W/DEVICE KIT PT NEEDS METER-STRIPS(1 BOTTLE =100/5 REFILLS)-LANCETS(1 BOX/5 REFILLS) CHECKS BS QD DX: 250.00 1 kit 0  . Calcium Carbonate Antacid (TUMS PO) Take by mouth at bedtime. RARELY WOULD NEED ADDITIONAL TUMS DURING DAY IF SHE ATE SPICY FOODS    . colchicine 0.6 MG tablet TAKE 1 TABLET DAILY. TAKE 2 TABS IMMEDIATELY THEN 1 IN AN HOUR IF STILL HURTING 30 tablet 3  . glucose blood (ACCU-CHEK AVIVA PLUS) test strip 1 each by Other route 2 (two) times daily. Check blood sugar twice daily  Fasting in AM, 2 hrs after a meal  E11.65 200 each 3  . levothyroxine (SYNTHROID, LEVOTHROID) 75 MCG tablet TAKE ONE TABLET BY MOUTH ONCE DAILY BEFORE  BREAKFAST 90 tablet 3  . lisinopril-hydrochlorothiazide (PRINZIDE,ZESTORETIC) 20-25 MG tablet TAKE 1 TABLET BY MOUTH DAILY 90 tablet 3  . meloxicam (MOBIC) 15 MG tablet TAKE 1 TABLET (15 MG TOTAL) BY MOUTH DAILY. 90 tablet 3  . metFORMIN (GLUCOPHAGE) 500 MG tablet TAKE 2 TABLETS BY MOUTH TWICE DAILY WITH A MEAL 360 tablet 1  . metFORMIN (GLUCOPHAGE) 500 MG tablet TAKE TWO TABLETS BY MOUTH TWICE A DAY WITH A MEAL 360 tablet 1  . Multiple Vitamin (MULTIVITAMIN WITH MINERALS) TABS Take 1 tablet by mouth daily.    . simvastatin (ZOCOR) 80 MG tablet TAKE 1/2 TABLET BY MOUTH AT BEDTIME 90 tablet 1  .  sitaGLIPtin (JANUVIA) 100 MG tablet Take 1 tablet (100 mg total) by mouth daily. 30 tablet 0  . triamcinolone ointment (KENALOG) 0.1 % Apply 1 application topically 2 (two) times daily.     No current  facility-administered medications on file prior to visit.    Allergies  Allergen Reactions  . Clindamycin/Lincomycin     CLINDAMYCIN TAKEN WITH CIPRO CAUSED SEVERE NAUSEA--PT STATES SHE CAN TAKE CIPRO ALONE WITHOUT PROBLEM--IT WAS JUST THE COMBINATION SHE DID NOT TOLERATE.  Marland Kitchen Levaquin [Levofloxacin] Nausea And Vomiting  . Metronidazole Hcl Other (See Comments)    Deathly sick WHEN PT TOOK METRONIDAZOLE WITH CIPRO.  PT STATES SHE HAS TAKEN CIPRO ALONE WITHOUT PROBLEM --JUST DID NOT TOLERATE THE COMBINATION OF BOTH DRUGS TAKEN TOGETHER.   Social History   Social History  . Marital status: Married    Spouse name: N/A  . Number of children: N/A  . Years of education: N/A   Occupational History  . Not on file.   Social History Main Topics  . Smoking status: Never Smoker  . Smokeless tobacco: Never Used  . Alcohol use No  . Drug use: No  . Sexual activity: Not on file   Other Topics Concern  . Not on file   Social History Narrative  . No narrative on file   No family history on file.     Review of Systems  All other systems reviewed and are negative.      Objective:   Physical Exam  Constitutional: She appears well-developed and well-nourished. No distress.  Neck: Neck supple. No thyromegaly present.  Cardiovascular: Normal rate, regular rhythm, normal heart sounds and intact distal pulses.   No murmur heard. Pulmonary/Chest: Effort normal and breath sounds normal. No respiratory distress. She has no wheezes. She has no rales.  Abdominal: Soft. Bowel sounds are normal. She exhibits no distension. There is no tenderness. There is no rebound.  Musculoskeletal: She exhibits no edema.  Skin: She is not diaphoretic.  Vitals reviewed.         Assessment & Plan:   Flu vaccine need - Plan: Flu vaccine HIGH DOSE PF  Controlled type 2 diabetes mellitus with diabetic nephropathy, without long-term current use of insulin (HCC)  Essential hypertension  Pure  hypercholesterolemia  Other specified hypothyroidism  Chronic toe pain, left foot Patient received her flu shot today. Her diabetes is not well controlled. Resume Actos 30 mg a day and recheck A1c in 3 months. Work on diet neck size. Blood pressure today is well controlled. I will make no changes in her blood pressure medication. Her cholesterol is acceptable. Her dosage of levothyroxine is in the therapeutic range. I will have the patient begin allopurinol 200 mg a day along with colchicine 0.6 mg a day and recheck uric acid in 6 weeks. Hopefully after 2 months, the tophaceous gout in the left fourth PIP joint will have improved. If not consider an MRI of the toe to evaluate for chronic osteomyelitis which I believe is highly unlikely

## 2017-08-02 ENCOUNTER — Telehealth: Payer: Self-pay | Admitting: Family Medicine

## 2017-08-02 NOTE — Telephone Encounter (Signed)
Patient would like a call back regarding clarification of her meds  [phone]

## 2017-08-02 NOTE — Telephone Encounter (Signed)
Spoke with patient she is aware of how to take her medications

## 2017-08-26 ENCOUNTER — Other Ambulatory Visit: Payer: Self-pay | Admitting: Family Medicine

## 2017-09-12 ENCOUNTER — Other Ambulatory Visit: Payer: Self-pay | Admitting: Family Medicine

## 2017-09-12 DIAGNOSIS — M1A9XX0 Chronic gout, unspecified, without tophus (tophi): Secondary | ICD-10-CM

## 2017-09-12 DIAGNOSIS — Z79899 Other long term (current) drug therapy: Secondary | ICD-10-CM

## 2017-09-17 ENCOUNTER — Other Ambulatory Visit: Payer: Medicare Other

## 2017-09-17 DIAGNOSIS — Z79899 Other long term (current) drug therapy: Secondary | ICD-10-CM

## 2017-09-17 DIAGNOSIS — M1A9XX0 Chronic gout, unspecified, without tophus (tophi): Secondary | ICD-10-CM

## 2017-09-18 LAB — URIC ACID: Uric Acid, Serum: 6.1 mg/dL (ref 2.5–7.0)

## 2017-09-21 ENCOUNTER — Telehealth: Payer: Self-pay | Admitting: Family Medicine

## 2017-09-21 NOTE — Telephone Encounter (Signed)
noted 

## 2017-09-21 NOTE — Telephone Encounter (Signed)
Pt call ing about gout medicine.  Is having a flare up in right foot today.  Is taking the Allopurinol but had stopped the Clochicine.  Looking at [facility] and last OV note she should be taking both.  Also med rec states to take a Colchicine 2-now and repeat in 1 hr if still painful.  She is going to that today then resume the Colchicine daily with the Allopurinol.  Said she will discuss further with Dr Dennard Schaumann at 3 mth check up

## 2017-09-30 ENCOUNTER — Other Ambulatory Visit: Payer: Self-pay | Admitting: Family Medicine

## 2017-10-03 NOTE — Telephone Encounter (Signed)
Medication refilled per protocol. 

## 2017-10-22 ENCOUNTER — Ambulatory Visit: Payer: Medicare Other | Admitting: Family Medicine

## 2017-10-22 ENCOUNTER — Encounter: Payer: Self-pay | Admitting: Family Medicine

## 2017-10-22 VITALS — BP 156/70 | HR 90 | Temp 98.3°F | Resp 18 | Ht 63.0 in

## 2017-10-22 DIAGNOSIS — H66002 Acute suppurative otitis media without spontaneous rupture of ear drum, left ear: Secondary | ICD-10-CM | POA: Diagnosis not present

## 2017-10-22 MED ORDER — AMOXICILLIN-POT CLAVULANATE 875-125 MG PO TABS: 1.0000 | ORAL_TABLET | Freq: Two times a day (BID) | ORAL | 0 refills | Status: DC

## 2017-10-22 MED ORDER — FLUCONAZOLE 150 MG PO TABS: 150.0000 mg | ORAL_TABLET | Freq: Once | ORAL | 0 refills | Status: DC

## 2017-10-22 NOTE — Progress Notes (Signed)
Subjective:    Patient ID: Whitney Morales, female    DOB: 13-Mar-1941, 76 y.o.   MRN: [id number]  HPI Patient has had an upper respiratory infection for more than a week. Symptoms include rhinorrhea, cough, head congestion. Over the last 3 days however she is developed severe pain in her left ear. She also has pain in her left frontal and left maxillary sinus tenderness to percussion in the left maxillary sinus. She also has pain in her upper left teeth. She reports subjective fevers. Past Medical History:  Diagnosis Date  . Abnormal finding on EKG 10/29/2012   FOLLOW UP NUCLEAR STRESS TEST ON 11/05/12 -NORMAL  . Corneal erosion 2012   RIGHT EYE--HAS RESOLVED--NO PROBLEMS SINCE  . Diabetes mellitus   . Diverticulitis   . Fatigue   . GERD (gastroesophageal reflux disease)    PAST HX--NO PROBLEMS NOW-PT DOES TAKE DAILY TUMS AT BEDTIME  . Goiter    cyst vs goiter on thyroid  . H/O hiatal hernia   . High cholesterol   . Hypertension   . Obesity    Past Surgical History:  Procedure Laterality Date  . ABDOMINAL HYSTERECTOMY    . BREAST LUMPECTOMY     BENIGN  . CARDIOVASCULAR STRESS TEST  06/08/2009   EF 78%, NO ISCHEMIA  . THYROID LOBECTOMY  11/27/2012   Procedure: THYROID LOBECTOMY;  Surgeon: Ralene Ok, MD;  Location: [facility];  Service: General;  Laterality: Right;  Right Thyroid Lobectomy  . TONSILLECTOMY    . US ECHOCARDIOGRAPHY  03/23/2006   EF 55-60%   Current Outpatient Medications on File Prior to Visit  Medication Sig Dispense Refill  . ACCU-CHEK SOFTCLIX LANCETS lancets USE AS DIRECTED TO CHECK SUGAR 100 each 1  . allopurinol (ZYLOPRIM) 100 MG tablet Take 2 tablets (200 mg total) by mouth daily. 60 tablet 5  . Blood Glucose Monitoring Suppl (ACCU-CHEK AVIVA PLUS) W/DEVICE KIT PT NEEDS METER-STRIPS(1 BOTTLE =100/5 REFILLS)-LANCETS(1 BOX/5 REFILLS) CHECKS BS QD DX: 250.00 1 kit 0  . Calcium Carbonate Antacid (TUMS PO) Take by mouth at bedtime. RARELY WOULD NEED ADDITIONAL  TUMS DURING DAY IF SHE ATE SPICY FOODS    . colchicine 0.6 MG tablet TAKE 1 TABLET DAILY. TAKE 2 TABS IMMEDIATELY THEN 1 IN AN HOUR IF STILL HURTING 30 tablet 2  . glucose blood (ACCU-CHEK AVIVA PLUS) test strip 1 each by Other route 2 (two) times daily. Check blood sugar twice daily  Fasting in AM, 2 hrs after a meal  E11.65 200 each 3  . levothyroxine (SYNTHROID, LEVOTHROID) 75 MCG tablet TAKE ONE TABLET BY MOUTH ONCE DAILY BEFORE  BREAKFAST 90 tablet 3  . lisinopril-hydrochlorothiazide (PRINZIDE,ZESTORETIC) 20-25 MG tablet TAKE 1 TABLET BY MOUTH DAILY 90 tablet 3  . metFORMIN (GLUCOPHAGE) 500 MG tablet TAKE 2 TABLETS BY MOUTH TWICE DAILY WITH A MEAL 360 tablet 1  . Multiple Vitamin (MULTIVITAMIN WITH MINERALS) TABS Take 1 tablet by mouth daily.    . pioglitazone (ACTOS) 30 MG tablet Take 1 tablet (30 mg total) by mouth daily. 30 tablet 5  . simvastatin (ZOCOR) 80 MG tablet TAKE 1/2 TABLET BY MOUTH AT BEDTIME 90 tablet 1  . sitaGLIPtin (JANUVIA) 100 MG tablet Take 1 tablet (100 mg total) by mouth daily. 30 tablet 0   No current facility-administered medications on file prior to visit.    Allergies  Allergen Reactions  . Clindamycin/Lincomycin     CLINDAMYCIN TAKEN WITH CIPRO CAUSED SEVERE NAUSEA--PT STATES SHE CAN TAKE CIPRO ALONE  WITHOUT PROBLEM--IT WAS JUST THE COMBINATION SHE DID NOT TOLERATE.  Marland Kitchen Levaquin [Levofloxacin] Nausea And Vomiting  . Metronidazole Hcl Other (See Comments)    Deathly sick WHEN PT TOOK METRONIDAZOLE WITH CIPRO.  PT STATES SHE HAS TAKEN CIPRO ALONE WITHOUT PROBLEM --JUST DID NOT TOLERATE THE COMBINATION OF BOTH DRUGS TAKEN TOGETHER.   Social History   Socioeconomic History  . Marital status: Married    Spouse name: Not on file  . Number of children: Not on file  . Years of education: Not on file  . Highest education level: Not on file  Social Needs  . Financial resource strain: Not on file  . Food insecurity - worry: Not on file  . Food insecurity -  inability: Not on file  . Transportation needs - medical: Not on file  . Transportation needs - non-medical: Not on file  Occupational History  . Not on file  Tobacco Use  . Smoking status: Never Smoker  . Smokeless tobacco: Never Used  Substance and Sexual Activity  . Alcohol use: No  . Drug use: No  . Sexual activity: Not on file  Other Topics Concern  . Not on file  Social History Narrative  . Not on file      Review of Systems  All other systems reviewed and are negative.      Objective:   Physical Exam  Constitutional: She appears well-developed and well-nourished.  HENT:  Right Ear: External ear normal. Tympanic membrane is not injected, not scarred, not perforated, not erythematous, not retracted and not bulging.  Left Ear: External ear normal. Tympanic membrane is injected, erythematous and bulging. Tympanic membrane is not scarred, not perforated and not retracted.  Neck: Neck supple.  Cardiovascular: Normal rate, regular rhythm and normal heart sounds.  Pulmonary/Chest: Effort normal and breath sounds normal. No respiratory distress. She has no wheezes. She has no rales.  Lymphadenopathy:    She has no cervical adenopathy.  Vitals reviewed.         Assessment & Plan:  Acute suppurative otitis media of left ear without spontaneous rupture of tympanic membrane, recurrence not specified  Patient's viral upper respiratory infection has progressed into a secondary sinus infection as well as a left otitis media with effusion. Begin Augmentin 875 mg by mouth twice a day for 10 days. Recheck in one week if no better or sooner if worse. She can use Diflucan 150 mg if needed for yeast infection

## 2017-10-31 ENCOUNTER — Other Ambulatory Visit: Payer: Self-pay | Admitting: Family Medicine

## 2017-10-31 MED ORDER — COLCHICINE 0.6 MG PO TABS: ORAL_TABLET | ORAL | 1 refills | Status: DC

## 2017-11-02 ENCOUNTER — Other Ambulatory Visit: Payer: Self-pay | Admitting: Family Medicine

## 2017-12-07 ENCOUNTER — Other Ambulatory Visit: Payer: Medicare Other

## 2017-12-07 DIAGNOSIS — E039 Hypothyroidism, unspecified: Secondary | ICD-10-CM

## 2017-12-07 DIAGNOSIS — E785 Hyperlipidemia, unspecified: Secondary | ICD-10-CM

## 2017-12-07 DIAGNOSIS — E119 Type 2 diabetes mellitus without complications: Secondary | ICD-10-CM

## 2017-12-07 DIAGNOSIS — I10 Essential (primary) hypertension: Secondary | ICD-10-CM

## 2017-12-08 LAB — CBC WITH DIFFERENTIAL/PLATELET
Basophils Absolute: 63 cells/uL (ref 0–200)
Basophils Relative: 1 %
Eosinophils Absolute: 151 cells/uL (ref 15–500)
Eosinophils Relative: 2.4 %
HCT: 35.5 % (ref 35.0–45.0)
Hemoglobin: 11.8 g/dL (ref 11.7–15.5)
Lymphs Abs: 1934 cells/uL (ref 850–3900)
MCH: 30.3 pg (ref 27.0–33.0)
MCHC: 33.2 g/dL (ref 32.0–36.0)
MCV: 91 fL (ref 80.0–100.0)
MPV: 10.1 fL (ref 7.5–12.5)
Monocytes Relative: 9.9 %
Neutro Abs: 3528 cells/uL (ref 1500–7800)
Neutrophils Relative %: 56 %
Platelets: 330 10*3/uL (ref 140–400)
RBC: 3.9 10*6/uL (ref 3.80–5.10)
RDW: 12.9 % (ref 11.0–15.0)
Total Lymphocyte: 30.7 %
WBC mixed population: 624 cells/uL (ref 200–950)
WBC: 6.3 10*3/uL (ref 3.8–10.8)

## 2017-12-08 LAB — COMPLETE METABOLIC PANEL WITH GFR
AG Ratio: 1.8 (calc) (ref 1.0–2.5)
ALT: 14 U/L (ref 6–29)
AST: 16 U/L (ref 10–35)
Albumin: 4.1 g/dL (ref 3.6–5.1)
Alkaline phosphatase (APISO): 46 U/L (ref 33–130)
BUN/Creatinine Ratio: 16 (calc) (ref 6–22)
BUN: 17 mg/dL (ref 7–25)
CO2: 24 mmol/L (ref 20–32)
Calcium: 9.7 mg/dL (ref 8.6–10.4)
Chloride: 103 mmol/L (ref 98–110)
Creat: 1.04 mg/dL — ABNORMAL HIGH (ref 0.60–0.93)
GFR, Est African American: 61 mL/min/{1.73_m2} (ref >=60)
GFR, Est Non African American: 53 mL/min/{1.73_m2} — ABNORMAL LOW (ref >=60)
Globulin: 2.3 g/dL (calc) (ref 1.9–3.7)
Glucose, Bld: 122 mg/dL — ABNORMAL HIGH (ref 65–99)
Potassium: 4.5 mmol/L (ref 3.5–5.3)
Sodium: 139 mmol/L (ref 135–146)
Total Bilirubin: 0.3 mg/dL (ref 0.2–1.2)
Total Protein: 6.4 g/dL (ref 6.1–8.1)

## 2017-12-08 LAB — LIPID PANEL
Cholesterol: 140 mg/dL (ref <200)
HDL: 39 mg/dL — ABNORMAL LOW (ref >50)
LDL Cholesterol (Calc): 78 mg/dL (calc)
Non-HDL Cholesterol (Calc): 101 mg/dL (calc) (ref <130)
Total CHOL/HDL Ratio: 3.6 (calc) (ref <5.0)
Triglycerides: 130 mg/dL (ref <150)

## 2017-12-08 LAB — TSH: TSH: 2.96 mIU/L (ref 0.40–4.50)

## 2017-12-08 LAB — HEMOGLOBIN A1C
Hgb A1c MFr Bld: 6.4 % of total Hgb — ABNORMAL HIGH (ref <5.7)
Mean Plasma Glucose: 137 (calc)
eAG (mmol/L): 7.6 (calc)

## 2017-12-10 ENCOUNTER — Encounter: Payer: Self-pay | Admitting: Family Medicine

## 2017-12-10 ENCOUNTER — Ambulatory Visit: Payer: Medicare Other | Admitting: Family Medicine

## 2017-12-10 VITALS — BP 146/70 | HR 88 | Temp 98.3°F | Resp 18 | Ht 63.0 in | Wt 234.0 lb

## 2017-12-10 DIAGNOSIS — E038 Other specified hypothyroidism: Secondary | ICD-10-CM

## 2017-12-10 DIAGNOSIS — Z780 Asymptomatic menopausal state: Secondary | ICD-10-CM | POA: Diagnosis not present

## 2017-12-10 DIAGNOSIS — E1121 Type 2 diabetes mellitus with diabetic nephropathy: Secondary | ICD-10-CM | POA: Diagnosis not present

## 2017-12-10 DIAGNOSIS — I10 Essential (primary) hypertension: Secondary | ICD-10-CM

## 2017-12-10 DIAGNOSIS — E7800 Pure hypercholesterolemia, unspecified: Secondary | ICD-10-CM | POA: Diagnosis not present

## 2017-12-10 DIAGNOSIS — N183 Chronic kidney disease, stage 3 unspecified: Secondary | ICD-10-CM

## 2017-12-10 NOTE — Progress Notes (Signed)
Subjective:    Patient ID: Whitney Morales, female    DOB: [birth year], 77 y.o.   MRN: [id number]  Medication Refill   Patient is here today for follow-up.  She denies any hypoglycemic episodes.  She denies any polyuria, polydipsia, or blurry vision.  She denies any neuropathy in her feet.  She denies any chest pain shortness of breath or dyspnea on exertion.  She denies any myalgias or right upper quadrant pain.  She is compliant with her medication.  Diabetic eye exam is up-to-date.  Diabetic foot exam is performed today and is normal.  Cancer screening is up-to-date.  Her last mammogram per her report was 2018.  I have asked her to get me a copy for our records.   Her last colonoscopy was October 2018 and is up-to-date. She is due for DEXA and shingrix.  Immunizations are listed below and are up-to-date: Immunization History  Administered Date(s) Administered  . Influenza Split 08/11/2013, 07/24/2015, 08/11/2016  . Influenza, High Dose Seasonal PF 07/31/2017  . Pneumococcal Conjugate-13 09/15/2013  . Pneumococcal Polysaccharide-23 09/04/2005, 08/08/2016  . Zoster 04/24/2011   most recent lab work as listed below: Lab on 12/07/2017  Component Date Value Ref Range Status  . WBC 12/07/2017 6.3  3.8 - 10.8 Thousand/uL Final  . RBC 12/07/2017 3.90  3.80 - 5.10 Million/uL Final  . Hemoglobin 12/07/2017 11.8  11.7 - 15.5 g/dL Final  . HCT 12/07/2017 35.5  35.0 - 45.0 % Final  . MCV 12/07/2017 91.0  80.0 - 100.0 fL Final  . MCH 12/07/2017 30.3  27.0 - 33.0 pg Final  . MCHC 12/07/2017 33.2  32.0 - 36.0 g/dL Final  . RDW 12/07/2017 12.9  11.0 - 15.0 % Final  . Platelets 12/07/2017 330  140 - 400 Thousand/uL Final  . MPV 12/07/2017 10.1  7.5 - 12.5 fL Final  . Neutro Abs 12/07/2017 3,528  1,500 - 7,800 cells/uL Final  . Lymphs Abs 12/07/2017 1,934  850 - 3,900 cells/uL Final  . WBC mixed population 12/07/2017 624  200 - 950 cells/uL Final  . Eosinophils Absolute 12/07/2017 151  15 - 500  cells/uL Final  . Basophils Absolute 12/07/2017 63  0 - 200 cells/uL Final  . Neutrophils Relative % 12/07/2017 56  % Final  . Total Lymphocyte 12/07/2017 30.7  % Final  . Monocytes Relative 12/07/2017 9.9  % Final  . Eosinophils Relative 12/07/2017 2.4  % Final  . Basophils Relative 12/07/2017 1.0  % Final  . Glucose, Bld 12/07/2017 122* 65 - 99 mg/dL Final   Comment: .            Fasting reference interval . For someone without known diabetes, a glucose value between 100 and 125 mg/dL is consistent with prediabetes and should be confirmed with a follow-up test. .   . BUN 12/07/2017 17  7 - 25 mg/dL Final  . Creat 12/07/2017 1.04* 0.60 - 0.93 mg/dL Final   Comment: For patients >43 years of age, the reference limit for Creatinine is approximately 13% higher for people identified as African-American. .   . GFR, Est Non African American 12/07/2017 53* > OR = 60 mL/min/1.59m Final  . GFR, Est African American 12/07/2017 61  > OR = 60 mL/min/1.773mFinal  . BUN/Creatinine Ratio 12/07/2017 16  6 - 22 (calc) Final  . Sodium 12/07/2017 139  135 - 146 mmol/L Final  . Potassium 12/07/2017 4.5  3.5 - 5.3 mmol/L Final  . Chloride 12/07/2017 103  98 - 110 mmol/L Final  . CO2 12/07/2017 24  20 - 32 mmol/L Final  . Calcium 12/07/2017 9.7  8.6 - 10.4 mg/dL Final  . Total Protein 12/07/2017 6.4  6.1 - 8.1 g/dL Final  . Albumin 12/07/2017 4.1  3.6 - 5.1 g/dL Final  . Globulin 12/07/2017 2.3  1.9 - 3.7 g/dL (calc) Final  . AG Ratio 12/07/2017 1.8  1.0 - 2.5 (calc) Final  . Total Bilirubin 12/07/2017 0.3  0.2 - 1.2 mg/dL Final  . Alkaline phosphatase (APISO) 12/07/2017 46  33 - 130 U/L Final  . AST 12/07/2017 16  10 - 35 U/L Final  . ALT 12/07/2017 14  6 - 29 U/L Final  . Cholesterol 12/07/2017 140  <200 mg/dL Final  . HDL 12/07/2017 39* >50 mg/dL Final  . Triglycerides 12/07/2017 130  <150 mg/dL Final  . LDL Cholesterol (Calc) 12/07/2017 78  mg/dL (calc) Final   Comment: Reference range:  <100 . Desirable range <100 mg/dL for primary prevention;   <70 mg/dL for patients with CHD or diabetic patients  with > or = 2 CHD risk factors. Marland Kitchen LDL-C is now calculated using the Martin-Hopkins  calculation, which is a validated novel method providing  better accuracy than the Friedewald equation in the  estimation of LDL-C.  Cresenciano Genre et al. Annamaria Helling. 1610;960(45): 2061-2068  (http://education.[facility].com/faq/FAQ164)   . Total CHOL/HDL Ratio 12/07/2017 3.6  <5.0 (calc) Final  . Non-HDL Cholesterol (Calc) 12/07/2017 101  <130 mg/dL (calc) Final   Comment: For patients with diabetes plus 1 major ASCVD risk  factor, treating to a non-HDL-C goal of <100 mg/dL  (LDL-C of <70 mg/dL) is considered a therapeutic  option.   . Hgb A1c MFr Bld 12/07/2017 6.4* <5.7 % of total Hgb Final   Comment: For someone without known diabetes, a hemoglobin  A1c value between 5.7% and 6.4% is consistent with prediabetes and should be confirmed with a  follow-up test. . For someone with known diabetes, a value <7% indicates that their diabetes is well controlled. A1c targets should be individualized based on duration of diabetes, age, comorbid conditions, and other considerations. . This assay result is consistent with an increased risk of diabetes. . Currently, no consensus exists regarding use of hemoglobin A1c for diagnosis of diabetes for children. .   . Mean Plasma Glucose 12/07/2017 137  (calc) Final  . eAG (mmol/L) 12/07/2017 7.6  (calc) Final  . TSH 12/07/2017 2.96  0.40 - 4.50 mIU/L Final    Past Medical History:  Diagnosis Date  . Abnormal finding on EKG 10/29/2012   FOLLOW UP NUCLEAR STRESS TEST ON 11/05/12 -NORMAL  . Corneal erosion 2012   RIGHT EYE--HAS RESOLVED--NO PROBLEMS SINCE  . Diabetes mellitus   . Diverticulitis   . Fatigue   . GERD (gastroesophageal reflux disease)    PAST HX--NO PROBLEMS NOW-PT DOES TAKE DAILY TUMS AT BEDTIME  . Goiter    cyst vs goiter on  thyroid  . H/O hiatal hernia   . High cholesterol   . Hypertension   . Obesity    Past Surgical History:  Procedure Laterality Date  . ABDOMINAL HYSTERECTOMY    . BREAST LUMPECTOMY     BENIGN  . CARDIOVASCULAR STRESS TEST  06/08/2009   EF 78%, NO ISCHEMIA  . THYROID LOBECTOMY  11/27/2012   Procedure: THYROID LOBECTOMY;  Surgeon: Ralene Ok, MD;  Location: [facility];  Service: General;  Laterality: Right;  Right Thyroid Lobectomy  . TONSILLECTOMY    .  US ECHOCARDIOGRAPHY  03/23/2006   EF 55-60%   Current Outpatient Medications on File Prior to Visit  Medication Sig Dispense Refill  . ACCU-CHEK SOFTCLIX LANCETS lancets USE AS DIRECTED TO CHECK SUGAR 100 each 1  . allopurinol (ZYLOPRIM) 100 MG tablet Take 2 tablets (200 mg total) by mouth daily. 60 tablet 5  . Blood Glucose Monitoring Suppl (ACCU-CHEK AVIVA PLUS) W/DEVICE KIT PT NEEDS METER-STRIPS(1 BOTTLE =100/5 REFILLS)-LANCETS(1 BOX/5 REFILLS) CHECKS BS QD DX: 250.00 1 kit 0  . Calcium Carbonate Antacid (TUMS PO) Take by mouth at bedtime. RARELY WOULD NEED ADDITIONAL TUMS DURING DAY IF SHE ATE SPICY FOODS    . colchicine 0.6 MG tablet TAKE 1 TABLET DAILY. TAKE 2 TABS IMMEDIATELY THEN 1 IN AN HOUR IF STILL HURTING 90 tablet 1  . glucose blood (ACCU-CHEK AVIVA PLUS) test strip 1 each by Other route 2 (two) times daily. Check blood sugar twice daily  Fasting in AM, 2 hrs after a meal  E11.65 200 each 3  . levothyroxine (SYNTHROID, LEVOTHROID) 75 MCG tablet TAKE ONE TABLET BY MOUTH ONCE DAILY BEFORE  BREAKFAST 90 tablet 3  . lisinopril-hydrochlorothiazide (PRINZIDE,ZESTORETIC) 20-25 MG tablet TAKE 1 TABLET BY MOUTH DAILY 90 tablet 3  . metFORMIN (GLUCOPHAGE) 500 MG tablet TAKE TWO TABLETS BY MOUTH TWICE A DAY WITH A MEAL 360 tablet 3  . Multiple Vitamin (MULTIVITAMIN WITH MINERALS) TABS Take 1 tablet by mouth daily.    . pioglitazone (ACTOS) 30 MG tablet Take 1 tablet (30 mg total) by mouth daily. 30 tablet 5  . simvastatin (ZOCOR) 80 MG  tablet TAKE 1/2 TABLET BY MOUTH AT BEDTIME 90 tablet 1  . sitaGLIPtin (JANUVIA) 100 MG tablet Take 1 tablet (100 mg total) by mouth daily. 30 tablet 0   No current facility-administered medications on file prior to visit.    Allergies  Allergen Reactions  . Clindamycin/Lincomycin     CLINDAMYCIN TAKEN WITH CIPRO CAUSED SEVERE NAUSEA--PT STATES SHE CAN TAKE CIPRO ALONE WITHOUT PROBLEM--IT WAS JUST THE COMBINATION SHE DID NOT TOLERATE.  Marland Kitchen Levaquin [Levofloxacin] Nausea And Vomiting  . Metronidazole Hcl Other (See Comments)    Deathly sick WHEN PT TOOK METRONIDAZOLE WITH CIPRO.  PT STATES SHE HAS TAKEN CIPRO ALONE WITHOUT PROBLEM --JUST DID NOT TOLERATE THE COMBINATION OF BOTH DRUGS TAKEN TOGETHER.   Social History   Socioeconomic History  . Marital status: Married    Spouse name: Not on file  . Number of children: Not on file  . Years of education: Not on file  . Highest education level: Not on file  Social Needs  . Financial resource strain: Not on file  . Food insecurity - worry: Not on file  . Food insecurity - inability: Not on file  . Transportation needs - medical: Not on file  . Transportation needs - non-medical: Not on file  Occupational History  . Not on file  Tobacco Use  . Smoking status: Never Smoker  . Smokeless tobacco: Never Used  Substance and Sexual Activity  . Alcohol use: No  . Drug use: No  . Sexual activity: Not on file  Other Topics Concern  . Not on file  Social History Narrative  . Not on file   No family history on file.     Review of Systems  All other systems reviewed and are negative.      Objective:   Physical Exam  Constitutional: She appears well-developed and well-nourished. No distress.  Neck: Neck supple. No thyromegaly present.  Cardiovascular: Normal rate, regular rhythm, normal heart sounds and intact distal pulses.  No murmur heard. Pulmonary/Chest: Effort normal and breath sounds normal. No respiratory distress. She has no  wheezes. She has no rales.  Abdominal: Soft. Bowel sounds are normal. She exhibits no distension. There is no tenderness. There is no rebound.  Musculoskeletal: She exhibits no edema.  Skin: She is not diaphoretic.  Vitals reviewed.         Assessment & Plan:  Controlled type 2 diabetes mellitus with diabetic nephropathy, without long-term current use of insulin (HCC)  Essential hypertension  Pure hypercholesterolemia  CKD (chronic kidney disease) stage 3, GFR 30-59 ml/min (HCC)  Other specified hypothyroidism   Diabetes is well controlled.  Her hemoglobin A1c is outstanding.  I am very happy with her LDL cholesterol is it is well below 100.  Her renal function remains stable.  Continue to encourage avoidance of NSAIDs and promote drinking plenty of water.  Thyroid/TSH is within therapeutic range.  No changes are necessary medication.  Immunizations are up-to-date.  Cancer screening is up-to-date.  Reassess in 6 months.  I congratulated the patient on the improvement in her lab work.  Schedule DEXA.  Encouraged shingrix.  Try to lose 10-15 lbs to help lower BP.  Stop colchicine.  If gout flare reoccurs will increase allopurinol to 300 mg a day.

## 2017-12-20 ENCOUNTER — Encounter: Payer: Self-pay | Admitting: *Deleted

## 2017-12-28 ENCOUNTER — Ambulatory Visit
Admission: RE | Admit: 2017-12-28 | Discharge: 2017-12-28 | Disposition: A | Discharge status: Home / Self Care | Payer: Medicare Other | Source: Ambulatory Visit | Attending: Family Medicine | Admitting: Family Medicine

## 2017-12-28 DIAGNOSIS — Z780 Asymptomatic menopausal state: Secondary | ICD-10-CM

## 2017-12-31 ENCOUNTER — Encounter: Payer: Self-pay | Admitting: Family Medicine

## 2018-01-09 ENCOUNTER — Other Ambulatory Visit: Payer: Self-pay | Admitting: Family Medicine

## 2018-01-09 MED ORDER — GLUCOSE BLOOD VI STRP: 1.0000 | ORAL_STRIP | Freq: Two times a day (BID) | 3 refills | Status: DC

## 2018-01-19 ENCOUNTER — Other Ambulatory Visit: Payer: Self-pay | Admitting: Family Medicine

## 2018-01-22 ENCOUNTER — Other Ambulatory Visit: Payer: Self-pay | Admitting: Family Medicine

## 2018-03-11 LAB — HM DIABETES EYE EXAM

## 2018-03-15 ENCOUNTER — Encounter: Payer: Self-pay | Admitting: *Deleted

## 2018-04-13 ENCOUNTER — Other Ambulatory Visit: Payer: Self-pay | Admitting: Family Medicine

## 2018-04-21 ENCOUNTER — Other Ambulatory Visit: Payer: Self-pay | Admitting: Family Medicine

## 2018-05-01 ENCOUNTER — Other Ambulatory Visit: Payer: Medicare Other

## 2018-05-01 DIAGNOSIS — E1121 Type 2 diabetes mellitus with diabetic nephropathy: Secondary | ICD-10-CM

## 2018-05-01 DIAGNOSIS — N183 Chronic kidney disease, stage 3 unspecified: Secondary | ICD-10-CM

## 2018-05-01 DIAGNOSIS — E785 Hyperlipidemia, unspecified: Secondary | ICD-10-CM

## 2018-05-01 DIAGNOSIS — E039 Hypothyroidism, unspecified: Secondary | ICD-10-CM

## 2018-05-01 DIAGNOSIS — M109 Gout, unspecified: Secondary | ICD-10-CM

## 2018-05-02 LAB — URIC ACID: Uric Acid, Serum: 6.3 mg/dL (ref 2.5–7.0)

## 2018-05-03 ENCOUNTER — Other Ambulatory Visit: Payer: Self-pay | Admitting: Family Medicine

## 2018-05-03 LAB — COMPREHENSIVE METABOLIC PANEL
AG Ratio: 1.8 (calc) (ref 1.0–2.5)
ALT: 15 U/L (ref 6–29)
AST: 16 U/L (ref 10–35)
Albumin: 4.2 g/dL (ref 3.6–5.1)
Alkaline phosphatase (APISO): 47 U/L (ref 33–130)
BUN/Creatinine Ratio: 17 (calc) (ref 6–22)
BUN: 18 mg/dL (ref 7–25)
CO2: 20 mmol/L (ref 20–32)
Calcium: 9.8 mg/dL (ref 8.6–10.4)
Chloride: 106 mmol/L (ref 98–110)
Creat: 1.08 mg/dL — ABNORMAL HIGH (ref 0.60–0.93)
Globulin: 2.4 g/dL (calc) (ref 1.9–3.7)
Glucose, Bld: 143 mg/dL — ABNORMAL HIGH (ref 65–99)
Potassium: 5.4 mmol/L — ABNORMAL HIGH (ref 3.5–5.3)
Sodium: 145 mmol/L (ref 135–146)
Total Bilirubin: 0.2 mg/dL (ref 0.2–1.2)
Total Protein: 6.6 g/dL (ref 6.1–8.1)

## 2018-05-03 LAB — CBC WITH DIFFERENTIAL/PLATELET
Basophils Absolute: 73 cells/uL (ref 0–200)
Basophils Relative: 0.9 %
Eosinophils Absolute: 170 cells/uL (ref 15–500)
Eosinophils Relative: 2.1 %
HCT: 35.7 % (ref 35.0–45.0)
Hemoglobin: 11.7 g/dL (ref 11.7–15.5)
Lymphs Abs: 1717 cells/uL (ref 850–3900)
MCH: 30.5 pg (ref 27.0–33.0)
MCHC: 32.8 g/dL (ref 32.0–36.0)
MCV: 93.2 fL (ref 80.0–100.0)
MPV: 10.2 fL (ref 7.5–12.5)
Monocytes Relative: 8.6 %
Neutro Abs: 5443 cells/uL (ref 1500–7800)
Neutrophils Relative %: 67.2 %
Platelets: 294 10*3/uL (ref 140–400)
RBC: 3.83 10*6/uL (ref 3.80–5.10)
RDW: 12.8 % (ref 11.0–15.0)
Total Lymphocyte: 21.2 %
WBC mixed population: 697 cells/uL (ref 200–950)
WBC: 8.1 10*3/uL (ref 3.8–10.8)

## 2018-05-03 LAB — HEMOGLOBIN A1C
Hgb A1c MFr Bld: 6.5 % of total Hgb — ABNORMAL HIGH (ref <5.7)
Mean Plasma Glucose: 140 (calc)
eAG (mmol/L): 7.7 (calc)

## 2018-05-03 LAB — TSH: TSH: 2.25 mIU/L (ref 0.40–4.50)

## 2018-05-03 LAB — LIPID PANEL
Cholesterol: 151 mg/dL (ref <200)
HDL: 37 mg/dL — ABNORMAL LOW (ref >50)
LDL Cholesterol (Calc): 86 mg/dL (calc)
Non-HDL Cholesterol (Calc): 114 mg/dL (calc) (ref <130)
Total CHOL/HDL Ratio: 4.1 (calc) (ref <5.0)
Triglycerides: 182 mg/dL — ABNORMAL HIGH (ref <150)

## 2018-05-07 ENCOUNTER — Ambulatory Visit: Payer: Medicare Other | Admitting: Family Medicine

## 2018-05-07 ENCOUNTER — Encounter: Payer: Self-pay | Admitting: Family Medicine

## 2018-05-07 VITALS — BP 148/74 | HR 78 | Temp 98.2°F | Resp 18 | Ht 63.0 in | Wt 236.0 lb

## 2018-05-07 DIAGNOSIS — E7800 Pure hypercholesterolemia, unspecified: Secondary | ICD-10-CM

## 2018-05-07 DIAGNOSIS — E038 Other specified hypothyroidism: Secondary | ICD-10-CM | POA: Diagnosis not present

## 2018-05-07 DIAGNOSIS — E1121 Type 2 diabetes mellitus with diabetic nephropathy: Secondary | ICD-10-CM

## 2018-05-07 DIAGNOSIS — I10 Essential (primary) hypertension: Secondary | ICD-10-CM

## 2018-05-07 NOTE — Progress Notes (Signed)
Subjective:    Patient ID: Whitney Morales, female    DOB: 1941-03-25, 77 y.o.   MRN: [id number]  Medication Refill   Patient is here today for follow-up.  Her recent lab work is listed below.  I am extremely happy with her hemoglobin A1c of 6.5.  She denies any hypoglycemic episodes.  She denies any polyuria, polydipsia, or blurry vision.  Her thyroid is adequately treated as evidenced by her normal TSH of 2.25.  Her LDL cholesterol remains well controlled however her HDL cholesterol remains low at 37 and her triglycerides are slightly elevated.  More concerning, her blood pressure is elevated at 148/74 and her weight remains elevated as well.  She admits that she is not following any specific diet or exercising regularly.  We calculated her 10-year risk of cardiovascular events and found to be 44% I demonstrated by lowering her blood pressure to a normal range, we can reduce her 10-year risk by 10%. Immunization History  Administered Date(s) Administered  . Influenza Split 08/11/2013, 07/24/2015, 08/11/2016  . Influenza, High Dose Seasonal PF 07/31/2017  . Pneumococcal Conjugate-13 09/15/2013  . Pneumococcal Polysaccharide-23 09/04/2005, 08/08/2016  . Zoster 04/24/2011   most recent lab work as listed below: Lab on 05/01/2018  Component Date Value Ref Range Status  . TSH 05/01/2018 2.25  0.40 - 4.50 mIU/L Final  . Hgb A1c MFr Bld 05/01/2018 6.5* <5.7 % of total Hgb Final   Comment: For someone without known diabetes, a hemoglobin A1c value of 6.5% or greater indicates that they may have  diabetes and this should be confirmed with a follow-up  test. . For someone with known diabetes, a value <7% indicates  that their diabetes is well controlled and a value  greater than or equal to 7% indicates suboptimal  control. A1c targets should be individualized based on  duration of diabetes, age, comorbid conditions, and  other considerations. . Currently, no consensus exists regarding use  of hemoglobin A1c for diagnosis of diabetes for children. .   . Mean Plasma Glucose 05/01/2018 140  (calc) Final  . eAG (mmol/L) 05/01/2018 7.7  (calc) Final  . Cholesterol 05/01/2018 151  <200 mg/dL Final  . HDL 05/01/2018 37* >50 mg/dL Final  . Triglycerides 05/01/2018 182* <150 mg/dL Final  . LDL Cholesterol (Calc) 05/01/2018 86  mg/dL (calc) Final   Comment: Reference range: <100 . Desirable range <100 mg/dL for primary prevention;   <70 mg/dL for patients with CHD or diabetic patients  with > or = 2 CHD risk factors. Marland Kitchen LDL-C is now calculated using the Martin-Hopkins  calculation, which is a validated novel method providing  better accuracy than the Friedewald equation in the  estimation of LDL-C.  Cresenciano Genre et al. Annamaria Helling. 5621;308(65): 2061-2068  (http://education.[facility].com/faq/FAQ164)   . Total CHOL/HDL Ratio 05/01/2018 4.1  <5.0 (calc) Final  . Non-HDL Cholesterol (Calc) 05/01/2018 114  <130 mg/dL (calc) Final   Comment: For patients with diabetes plus 1 major ASCVD risk  factor, treating to a non-HDL-C goal of <100 mg/dL  (LDL-C of <70 mg/dL) is considered a therapeutic  option.   . Glucose, Bld 05/01/2018 143* 65 - 99 mg/dL Final   Comment: .            Fasting reference interval . For someone without known diabetes, a glucose value >125 mg/dL indicates that they may have diabetes and this should be confirmed with a follow-up test. .   . BUN 05/01/2018 18  7 - 25  mg/dL Final  . Creat 05/01/2018 1.08* 0.60 - 0.93 mg/dL Final   Comment: For patients >66 years of age, the reference limit for Creatinine is approximately 13% higher for people identified as African-American. .   Havery Moros Ratio 05/01/2018 17  6 - 22 (calc) Final  . Sodium 05/01/2018 145  135 - 146 mmol/L Final  . Potassium 05/01/2018 5.4* 3.5 - 5.3 mmol/L Final  . Chloride 05/01/2018 106  98 - 110 mmol/L Final  . CO2 05/01/2018 20  20 - 32 mmol/L Final  . Calcium 05/01/2018 9.8   8.6 - 10.4 mg/dL Final  . Total Protein 05/01/2018 6.6  6.1 - 8.1 g/dL Final  . Albumin 05/01/2018 4.2  3.6 - 5.1 g/dL Final  . Globulin 05/01/2018 2.4  1.9 - 3.7 g/dL (calc) Final  . AG Ratio 05/01/2018 1.8  1.0 - 2.5 (calc) Final  . Total Bilirubin 05/01/2018 0.2  0.2 - 1.2 mg/dL Final  . Alkaline phosphatase (APISO) 05/01/2018 47  33 - 130 U/L Final  . AST 05/01/2018 16  10 - 35 U/L Final  . ALT 05/01/2018 15  6 - 29 U/L Final  . WBC 05/01/2018 8.1  3.8 - 10.8 Thousand/uL Final  . RBC 05/01/2018 3.83  3.80 - 5.10 Million/uL Final  . Hemoglobin 05/01/2018 11.7  11.7 - 15.5 g/dL Final  . HCT 05/01/2018 35.7  35.0 - 45.0 % Final  . MCV 05/01/2018 93.2  80.0 - 100.0 fL Final  . MCH 05/01/2018 30.5  27.0 - 33.0 pg Final  . MCHC 05/01/2018 32.8  32.0 - 36.0 g/dL Final  . RDW 05/01/2018 12.8  11.0 - 15.0 % Final  . Platelets 05/01/2018 294  140 - 400 Thousand/uL Final  . MPV 05/01/2018 10.2  7.5 - 12.5 fL Final  . Neutro Abs 05/01/2018 5,443  1,500 - 7,800 cells/uL Final  . Lymphs Abs 05/01/2018 1,717  850 - 3,900 cells/uL Final  . WBC mixed population 05/01/2018 697  200 - 950 cells/uL Final  . Eosinophils Absolute 05/01/2018 170  15 - 500 cells/uL Final  . Basophils Absolute 05/01/2018 73  0 - 200 cells/uL Final  . Neutrophils Relative % 05/01/2018 67.2  % Final  . Total Lymphocyte 05/01/2018 21.2  % Final  . Monocytes Relative 05/01/2018 8.6  % Final  . Eosinophils Relative 05/01/2018 2.1  % Final  . Basophils Relative 05/01/2018 0.9  % Final  . Uric Acid, Serum 05/01/2018 6.3  2.5 - 7.0 mg/dL Final   Comment: Therapeutic target for gout patients: <6.0 mg/dL .     Past Medical History:  Diagnosis Date  . Abnormal finding on EKG 10/29/2012   FOLLOW UP NUCLEAR STRESS TEST ON 11/05/12 -NORMAL  . Corneal erosion 2012   RIGHT EYE--HAS RESOLVED--NO PROBLEMS SINCE  . Diabetes mellitus   . Diverticulitis   . Fatigue   . GERD (gastroesophageal reflux disease)    PAST HX--NO PROBLEMS  NOW-PT DOES TAKE DAILY TUMS AT BEDTIME  . Goiter    cyst vs goiter on thyroid  . H/O hiatal hernia   . High cholesterol   . Hypertension   . Obesity   . Osteopenia    Past Surgical History:  Procedure Laterality Date  . ABDOMINAL HYSTERECTOMY    . BREAST LUMPECTOMY     BENIGN  . CARDIOVASCULAR STRESS TEST  06/08/2009   EF 78%, NO ISCHEMIA  . THYROID LOBECTOMY  11/27/2012   Procedure: THYROID LOBECTOMY;  Surgeon: Ralene Ok, MD;  Location:  [facility];  Service: General;  Laterality: Right;  Right Thyroid Lobectomy  . TONSILLECTOMY    . US ECHOCARDIOGRAPHY  03/23/2006   EF 55-60%   Current Outpatient Medications on File Prior to Visit  Medication Sig Dispense Refill  . ACCU-CHEK SOFTCLIX LANCETS lancets USE AS DIRECTED TO CHECK SUGAR 100 each 0  . allopurinol (ZYLOPRIM) 100 MG tablet TAKE 2 TABLETS BY MOUTH EVERY DAY 60 tablet 5  . Blood Glucose Monitoring Suppl (ACCU-CHEK AVIVA PLUS) W/DEVICE KIT PT NEEDS METER-STRIPS(1 BOTTLE =100/5 REFILLS)-LANCETS(1 BOX/5 REFILLS) CHECKS BS QD DX: 250.00 1 kit 0  . Calcium Carbonate Antacid (TUMS PO) Take by mouth at bedtime. RARELY WOULD NEED ADDITIONAL TUMS DURING DAY IF SHE ATE SPICY FOODS    . colchicine 0.6 MG tablet TAKE 1 TABLET DAILY. TAKE 2 TABS IMMEDIATELY THEN 1 IN AN HOUR IF STILL HURTING 90 tablet 1  . glucose blood (ACCU-CHEK AVIVA PLUS) test strip 1 each by Other route 2 (two) times daily. Check blood sugar twice daily  Fasting in AM, 2 hrs after a meal  E11.65 100 each 3  . JANUVIA 100 MG tablet TAKE 1 TABLET EVERY DAY 90 tablet 2  . levothyroxine (SYNTHROID, LEVOTHROID) 75 MCG tablet TAKE 1 TABLET BY MOUTH ONCE DAILY BEFORE BREAKFAST 90 tablet 3  . lisinopril-hydrochlorothiazide (PRINZIDE,ZESTORETIC) 20-25 MG tablet TAKE 1 TABLET BY MOUTH DAILY 90 tablet 2  . meloxicam (MOBIC) 15 MG tablet TAKE 1 TABLET (15 MG TOTAL) BY MOUTH DAILY. 90 tablet 3  . metFORMIN (GLUCOPHAGE) 500 MG tablet TAKE TWO TABLETS BY MOUTH TWICE A DAY WITH A MEAL  360 tablet 3  . Multiple Vitamin (MULTIVITAMIN WITH MINERALS) TABS Take 1 tablet by mouth daily.    . pioglitazone (ACTOS) 30 MG tablet Take 1 tablet (30 mg total) by mouth daily. 30 tablet 5  . simvastatin (ZOCOR) 80 MG tablet TAKE 1/2 TABLET BY MOUTH AT BEDTIME 90 tablet 1  . sitaGLIPtin (JANUVIA) 100 MG tablet Take 1 tablet (100 mg total) by mouth daily. 30 tablet 0   No current facility-administered medications on file prior to visit.    Allergies  Allergen Reactions  . Clindamycin/Lincomycin     CLINDAMYCIN TAKEN WITH CIPRO CAUSED SEVERE NAUSEA--PT STATES SHE CAN TAKE CIPRO ALONE WITHOUT PROBLEM--IT WAS JUST THE COMBINATION SHE DID NOT TOLERATE.  Marland Kitchen Levaquin [Levofloxacin] Nausea And Vomiting  . Metronidazole Hcl Other (See Comments)    Deathly sick WHEN PT TOOK METRONIDAZOLE WITH CIPRO.  PT STATES SHE HAS TAKEN CIPRO ALONE WITHOUT PROBLEM --JUST DID NOT TOLERATE THE COMBINATION OF BOTH DRUGS TAKEN TOGETHER.   Social History   Socioeconomic History  . Marital status: Married    Spouse name: Not on file  . Number of children: Not on file  . Years of education: Not on file  . Highest education level: Not on file  Occupational History  . Not on file  Social Needs  . Financial resource strain: Not on file  . Food insecurity:    Worry: Not on file    Inability: Not on file  . Transportation needs:    Medical: Not on file    Non-medical: Not on file  Tobacco Use  . Smoking status: Never Smoker  . Smokeless tobacco: Never Used  Substance and Sexual Activity  . Alcohol use: No  . Drug use: No  . Sexual activity: Not on file  Lifestyle  . Physical activity:    Days per week: Not on file  Minutes per session: Not on file  . Stress: Not on file  Relationships  . Social connections:    Talks on phone: Not on file    Gets together: Not on file    Attends religious service: Not on file    Active member of club or organization: Not on file    Attends meetings of clubs or  organizations: Not on file    Relationship status: Not on file  . Intimate partner violence:    Fear of current or ex partner: Not on file    Emotionally abused: Not on file    Physically abused: Not on file    Forced sexual activity: Not on file  Other Topics Concern  . Not on file  Social History Narrative  . Not on file   No family history on file.     Review of Systems  All other systems reviewed and are negative.      Objective:   Physical Exam  Constitutional: She appears well-developed and well-nourished. No distress.  Neck: Neck supple. No thyromegaly present.  Cardiovascular: Normal rate, regular rhythm, normal heart sounds and intact distal pulses.  No murmur heard. Pulmonary/Chest: Effort normal and breath sounds normal. No respiratory distress. She has no wheezes. She has no rales.  Abdominal: Soft. Bowel sounds are normal. She exhibits no distension. There is no tenderness. There is no rebound.  Musculoskeletal: She exhibits no edema.  Skin: She is not diaphoretic.  Vitals reviewed.         Assessment & Plan:  Controlled type 2 diabetes mellitus with diabetic nephropathy, without long-term current use of insulin ([place])  Essential hypertension  Pure hypercholesterolemia   Diabetes is well controlled.  Her hemoglobin A1c is outstanding.  I am very happy with her LDL cholesterol is it is well below 100.  Her renal function remains stable.  Thyroid/TSH is within therapeutic range.  No changes are necessary medication.  Patient's blood pressure remains elevated as it has been recently and her previous office visit however she still remains reluctant to take more medication despite the improvement in reduction in her cardiovascular risk rate.  She states that she will cut all salt out of her diet, try to lose 10 pounds, and exercise every day over the next 3 months and then recheck her blood pressure and her lab work to assess for improvement.  If her blood  pressure remains elevated we could add a low-dose beta-blocker such as atenolol or amlodipine

## 2018-05-16 ENCOUNTER — Other Ambulatory Visit: Payer: Self-pay | Admitting: Family Medicine

## 2018-07-05 LAB — HM MAMMOGRAPHY

## 2018-07-15 ENCOUNTER — Other Ambulatory Visit: Payer: Self-pay | Admitting: Family Medicine

## 2018-08-05 ENCOUNTER — Encounter: Payer: Self-pay | Admitting: *Deleted

## 2018-09-23 ENCOUNTER — Other Ambulatory Visit: Payer: Medicare Other

## 2018-09-23 DIAGNOSIS — I10 Essential (primary) hypertension: Secondary | ICD-10-CM

## 2018-09-23 DIAGNOSIS — Z79899 Other long term (current) drug therapy: Secondary | ICD-10-CM

## 2018-09-23 DIAGNOSIS — E038 Other specified hypothyroidism: Secondary | ICD-10-CM

## 2018-09-23 DIAGNOSIS — E7800 Pure hypercholesterolemia, unspecified: Secondary | ICD-10-CM

## 2018-09-23 DIAGNOSIS — E1121 Type 2 diabetes mellitus with diabetic nephropathy: Secondary | ICD-10-CM

## 2018-09-24 LAB — COMPREHENSIVE METABOLIC PANEL
AG Ratio: 1.8 (calc) (ref 1.0–2.5)
ALT: 12 U/L (ref 6–29)
AST: 13 U/L (ref 10–35)
Albumin: 4.2 g/dL (ref 3.6–5.1)
Alkaline phosphatase (APISO): 56 U/L (ref 33–130)
BUN/Creatinine Ratio: 13 (calc) (ref 6–22)
BUN: 15 mg/dL (ref 7–25)
CO2: 27 mmol/L (ref 20–32)
Calcium: 9.6 mg/dL (ref 8.6–10.4)
Chloride: 104 mmol/L (ref 98–110)
Creat: 1.17 mg/dL — ABNORMAL HIGH (ref 0.60–0.93)
Globulin: 2.3 g/dL (calc) (ref 1.9–3.7)
Glucose, Bld: 122 mg/dL — ABNORMAL HIGH (ref 65–99)
Potassium: 4.8 mmol/L (ref 3.5–5.3)
Sodium: 143 mmol/L (ref 135–146)
Total Bilirubin: 0.3 mg/dL (ref 0.2–1.2)
Total Protein: 6.5 g/dL (ref 6.1–8.1)

## 2018-09-24 LAB — CBC WITH DIFFERENTIAL/PLATELET
Basophils Absolute: 94 cells/uL (ref 0–200)
Basophils Relative: 0.9 %
Eosinophils Absolute: 229 cells/uL (ref 15–500)
Eosinophils Relative: 2.2 %
HCT: 35.5 % (ref 35.0–45.0)
Hemoglobin: 11.5 g/dL — ABNORMAL LOW (ref 11.7–15.5)
Lymphs Abs: 1820 cells/uL (ref 850–3900)
MCH: 30.1 pg (ref 27.0–33.0)
MCHC: 32.4 g/dL (ref 32.0–36.0)
MCV: 92.9 fL (ref 80.0–100.0)
MPV: 10.4 fL (ref 7.5–12.5)
Monocytes Relative: 8.3 %
Neutro Abs: 7394 cells/uL (ref 1500–7800)
Neutrophils Relative %: 71.1 %
Platelets: 309 10*3/uL (ref 140–400)
RBC: 3.82 10*6/uL (ref 3.80–5.10)
RDW: 12.4 % (ref 11.0–15.0)
Total Lymphocyte: 17.5 %
WBC mixed population: 863 cells/uL (ref 200–950)
WBC: 10.4 10*3/uL (ref 3.8–10.8)

## 2018-09-24 LAB — LIPID PANEL
Cholesterol: 138 mg/dL (ref <200)
HDL: 36 mg/dL — ABNORMAL LOW (ref >50)
LDL Cholesterol (Calc): 78 mg/dL (calc)
Non-HDL Cholesterol (Calc): 102 mg/dL (calc) (ref <130)
Total CHOL/HDL Ratio: 3.8 (calc) (ref <5.0)
Triglycerides: 143 mg/dL (ref <150)

## 2018-09-24 LAB — TSH: TSH: 4.29 mIU/L (ref 0.40–4.50)

## 2018-09-24 LAB — HEMOGLOBIN A1C
Hgb A1c MFr Bld: 6.7 % of total Hgb — ABNORMAL HIGH (ref <5.7)
Mean Plasma Glucose: 146 (calc)
eAG (mmol/L): 8.1 (calc)

## 2018-09-27 ENCOUNTER — Encounter: Payer: Self-pay | Admitting: Family Medicine

## 2018-09-27 ENCOUNTER — Ambulatory Visit: Payer: Medicare Other | Admitting: Family Medicine

## 2018-09-27 VITALS — BP 140/58 | HR 100 | Temp 98.4°F | Resp 16 | Ht 63.0 in | Wt 234.0 lb

## 2018-09-27 DIAGNOSIS — I10 Essential (primary) hypertension: Secondary | ICD-10-CM | POA: Diagnosis not present

## 2018-09-27 DIAGNOSIS — E1121 Type 2 diabetes mellitus with diabetic nephropathy: Secondary | ICD-10-CM | POA: Diagnosis not present

## 2018-09-27 DIAGNOSIS — E038 Other specified hypothyroidism: Secondary | ICD-10-CM

## 2018-09-27 DIAGNOSIS — E7800 Pure hypercholesterolemia, unspecified: Secondary | ICD-10-CM

## 2018-09-27 MED ORDER — BENZONATATE 200 MG PO CAPS: 200.0000 mg | ORAL_CAPSULE | Freq: Three times a day (TID) | ORAL | 0 refills | Status: DC | PRN

## 2018-09-27 NOTE — Progress Notes (Signed)
Subjective:    Patient ID: Whitney Morales, female    DOB: 09/04/1941, 77 y.o.   MRN: [id number]  Medication Refill   Patient is here today for follow-up.  Her recent lab work is listed below.  She denies any chest pain.  She denies any shortness of breath.  She denies any dyspnea on exertion.  She denies any myalgias.  She denies any right upper quadrant pain.  She denies any polyuria.  She denies any polydipsia.  She denies any blurry vision.  Hemoglobin A1c is adequately controlled at 6.7.  TSH is within therapeutic range albeit at the high end.  She does report some mild fatigue however she is not exercising.  She is not trying to lose weight.  She denies any sleep apnea or hypersomnolence. Lab on 09/23/2018  Component Date Value Ref Range Status  . WBC 09/23/2018 10.4  3.8 - 10.8 Thousand/uL Final  . RBC 09/23/2018 3.82  3.80 - 5.10 Million/uL Final  . Hemoglobin 09/23/2018 11.5* 11.7 - 15.5 g/dL Final  . HCT 09/23/2018 35.5  35.0 - 45.0 % Final  . MCV 09/23/2018 92.9  80.0 - 100.0 fL Final  . MCH 09/23/2018 30.1  27.0 - 33.0 pg Final  . MCHC 09/23/2018 32.4  32.0 - 36.0 g/dL Final  . RDW 09/23/2018 12.4  11.0 - 15.0 % Final  . Platelets 09/23/2018 309  140 - 400 Thousand/uL Final  . MPV 09/23/2018 10.4  7.5 - 12.5 fL Final  . Neutro Abs 09/23/2018 7,394  1,500 - 7,800 cells/uL Final  . Lymphs Abs 09/23/2018 1,820  850 - 3,900 cells/uL Final  . WBC mixed population 09/23/2018 863  200 - 950 cells/uL Final  . Eosinophils Absolute 09/23/2018 229  15 - 500 cells/uL Final  . Basophils Absolute 09/23/2018 94  0 - 200 cells/uL Final  . Neutrophils Relative % 09/23/2018 71.1  % Final  . Total Lymphocyte 09/23/2018 17.5  % Final  . Monocytes Relative 09/23/2018 8.3  % Final  . Eosinophils Relative 09/23/2018 2.2  % Final  . Basophils Relative 09/23/2018 0.9  % Final  . Glucose, Bld 09/23/2018 122* 65 - 99 mg/dL Final   Comment: .            Fasting reference interval . For someone  without known diabetes, a glucose value between 100 and 125 mg/dL is consistent with prediabetes and should be confirmed with a follow-up test. .   . BUN 09/23/2018 15  7 - 25 mg/dL Final  . Creat 09/23/2018 1.17* 0.60 - 0.93 mg/dL Final   Comment: For patients >90 years of age, the reference limit for Creatinine is approximately 13% higher for people identified as African-American. .   Havery Moros Ratio 09/23/2018 13  6 - 22 (calc) Final  . Sodium 09/23/2018 143  135 - 146 mmol/L Final  . Potassium 09/23/2018 4.8  3.5 - 5.3 mmol/L Final  . Chloride 09/23/2018 104  98 - 110 mmol/L Final  . CO2 09/23/2018 27  20 - 32 mmol/L Final  . Calcium 09/23/2018 9.6  8.6 - 10.4 mg/dL Final  . Total Protein 09/23/2018 6.5  6.1 - 8.1 g/dL Final  . Albumin 09/23/2018 4.2  3.6 - 5.1 g/dL Final  . Globulin 09/23/2018 2.3  1.9 - 3.7 g/dL (calc) Final  . AG Ratio 09/23/2018 1.8  1.0 - 2.5 (calc) Final  . Total Bilirubin 09/23/2018 0.3  0.2 - 1.2 mg/dL Final  . Alkaline phosphatase (APISO) 09/23/2018 56  33 - 130 U/L Final  . AST 09/23/2018 13  10 - 35 U/L Final  . ALT 09/23/2018 12  6 - 29 U/L Final  . Hgb A1c MFr Bld 09/23/2018 6.7* <5.7 % of total Hgb Final   Comment: For someone without known diabetes, a hemoglobin A1c value of 6.5% or greater indicates that they may have  diabetes and this should be confirmed with a follow-up  test. . For someone with known diabetes, a value <7% indicates  that their diabetes is well controlled and a value  greater than or equal to 7% indicates suboptimal  control. A1c targets should be individualized based on  duration of diabetes, age, comorbid conditions, and  other considerations. . Currently, no consensus exists regarding use of hemoglobin A1c for diagnosis of diabetes for children. .   . Mean Plasma Glucose 09/23/2018 146  (calc) Final  . eAG (mmol/L) 09/23/2018 8.1  (calc) Final  . Cholesterol 09/23/2018 138  <200 mg/dL Final  . HDL  09/23/2018 36* >50 mg/dL Final  . Triglycerides 09/23/2018 143  <150 mg/dL Final  . LDL Cholesterol (Calc) 09/23/2018 78  mg/dL (calc) Final   Comment: Reference range: <100 . Desirable range <100 mg/dL for primary prevention;   <70 mg/dL for patients with CHD or diabetic patients  with > or = 2 CHD risk factors. Marland Kitchen LDL-C is now calculated using the Martin-Hopkins  calculation, which is a validated novel method providing  better accuracy than the Friedewald equation in the  estimation of LDL-C.  Cresenciano Genre et al. Annamaria Helling. 5176;160(73): 2061-2068  (http://education.[facility].com/faq/FAQ164)   . Total CHOL/HDL Ratio 09/23/2018 3.8  <5.0 (calc) Final  . Non-HDL Cholesterol (Calc) 09/23/2018 102  <130 mg/dL (calc) Final   Comment: For patients with diabetes plus 1 major ASCVD risk  factor, treating to a non-HDL-C goal of <100 mg/dL  (LDL-C of <70 mg/dL) is considered a therapeutic  option.   Marland Kitchen TSH 09/23/2018 4.29  0.40 - 4.50 mIU/L Final    Past Medical History:  Diagnosis Date  . Abnormal finding on EKG 10/29/2012   FOLLOW UP NUCLEAR STRESS TEST ON 11/05/12 -NORMAL  . Corneal erosion 2012   RIGHT EYE--HAS RESOLVED--NO PROBLEMS SINCE  . Diabetes mellitus   . Diverticulitis   . Fatigue   . GERD (gastroesophageal reflux disease)    PAST HX--NO PROBLEMS NOW-PT DOES TAKE DAILY TUMS AT BEDTIME  . Goiter    cyst vs goiter on thyroid  . H/O hiatal hernia   . High cholesterol   . Hypertension   . Obesity   . Osteopenia    Past Surgical History:  Procedure Laterality Date  . ABDOMINAL HYSTERECTOMY    . BREAST LUMPECTOMY     BENIGN  . CARDIOVASCULAR STRESS TEST  06/08/2009   EF 78%, NO ISCHEMIA  . THYROID LOBECTOMY  11/27/2012   Procedure: THYROID LOBECTOMY;  Surgeon: Ralene Ok, MD;  Location: [facility];  Service: General;  Laterality: Right;  Right Thyroid Lobectomy  . TONSILLECTOMY    . US ECHOCARDIOGRAPHY  03/23/2006   EF 55-60%   Current Outpatient Medications on File  Prior to Visit  Medication Sig Dispense Refill  . ACCU-CHEK SOFTCLIX LANCETS lancets USE AS DIRECTED TO CHECK SUGAR 100 each 0  . allopurinol (ZYLOPRIM) 100 MG tablet TAKE 2 TABLETS BY MOUTH EVERY DAY 180 tablet 1  . Blood Glucose Monitoring Suppl (ACCU-CHEK AVIVA PLUS) W/DEVICE KIT PT NEEDS METER-STRIPS(1 BOTTLE =100/5 REFILLS)-LANCETS(1 BOX/5 REFILLS) CHECKS BS QD DX: 250.00 1  kit 0  . Calcium Carbonate Antacid (TUMS PO) Take by mouth at bedtime. RARELY WOULD NEED ADDITIONAL TUMS DURING DAY IF SHE ATE SPICY FOODS    . colchicine 0.6 MG tablet TAKE 1 TABLET DAILY. TAKE 2 TABS IMMEDIATELY THEN 1 IN AN HOUR IF STILL HURTING 90 tablet 1  . glucose blood (ACCU-CHEK AVIVA PLUS) test strip 1 each by Other route 2 (two) times daily. Check blood sugar twice daily  Fasting in AM, 2 hrs after a meal  E11.65 100 each 3  . levothyroxine (SYNTHROID, LEVOTHROID) 75 MCG tablet TAKE 1 TABLET BY MOUTH ONCE DAILY BEFORE BREAKFAST 90 tablet 3  . lisinopril-hydrochlorothiazide (PRINZIDE,ZESTORETIC) 20-25 MG tablet TAKE 1 TABLET BY MOUTH DAILY 90 tablet 2  . meloxicam (MOBIC) 15 MG tablet TAKE 1 TABLET (15 MG TOTAL) BY MOUTH DAILY. 90 tablet 3  . metFORMIN (GLUCOPHAGE) 500 MG tablet TAKE TWO TABLETS BY MOUTH TWICE A DAY WITH A MEAL 360 tablet 3  . Multiple Vitamin (MULTIVITAMIN WITH MINERALS) TABS Take 1 tablet by mouth daily.    . pioglitazone (ACTOS) 30 MG tablet TAKE 1 TABLET BY MOUTH EVERY DAY 90 tablet 1  . simvastatin (ZOCOR) 80 MG tablet TAKE 1/2 TABLET BY MOUTH AT BEDTIME 90 tablet 1  . sitaGLIPtin (JANUVIA) 100 MG tablet Take 1 tablet (100 mg total) by mouth daily. 30 tablet 0   No current facility-administered medications on file prior to visit.    Allergies  Allergen Reactions  . Clindamycin/Lincomycin     CLINDAMYCIN TAKEN WITH CIPRO CAUSED SEVERE NAUSEA--PT STATES SHE CAN TAKE CIPRO ALONE WITHOUT PROBLEM--IT WAS JUST THE COMBINATION SHE DID NOT TOLERATE.  Marland Kitchen Levaquin [Levofloxacin] Nausea And Vomiting   . Metronidazole Hcl Other (See Comments)    Deathly sick WHEN PT TOOK METRONIDAZOLE WITH CIPRO.  PT STATES SHE HAS TAKEN CIPRO ALONE WITHOUT PROBLEM --JUST DID NOT TOLERATE THE COMBINATION OF BOTH DRUGS TAKEN TOGETHER.   Social History   Socioeconomic History  . Marital status: Married    Spouse name: Not on file  . Number of children: Not on file  . Years of education: Not on file  . Highest education level: Not on file  Occupational History  . Not on file  Social Needs  . Financial resource strain: Not on file  . Food insecurity:    Worry: Not on file    Inability: Not on file  . Transportation needs:    Medical: Not on file    Non-medical: Not on file  Tobacco Use  . Smoking status: Never Smoker  . Smokeless tobacco: Never Used  Substance and Sexual Activity  . Alcohol use: No  . Drug use: No  . Sexual activity: Not on file  Lifestyle  . Physical activity:    Days per week: Not on file    Minutes per session: Not on file  . Stress: Not on file  Relationships  . Social connections:    Talks on phone: Not on file    Gets together: Not on file    Attends religious service: Not on file    Active member of club or organization: Not on file    Attends meetings of clubs or organizations: Not on file    Relationship status: Not on file  . Intimate partner violence:    Fear of current or ex partner: Not on file    Emotionally abused: Not on file    Physically abused: Not on file    Forced sexual activity:  Not on file  Other Topics Concern  . Not on file  Social History Narrative  . Not on file   No family history on file.     Review of Systems  All other systems reviewed and are negative.      Objective:   Physical Exam  Constitutional: She appears well-developed and well-nourished. No distress.  Neck: Neck supple. No thyromegaly present.  Cardiovascular: Normal rate, regular rhythm, normal heart sounds and intact distal pulses.  No murmur  heard. Pulmonary/Chest: Effort normal and breath sounds normal. No respiratory distress. She has no wheezes. She has no rales.  Abdominal: Soft. Bowel sounds are normal. She exhibits no distension. There is no tenderness. There is no rebound.  Musculoskeletal: She exhibits no edema.  Skin: She is not diaphoretic.  Vitals reviewed.         Assessment & Plan:  Controlled type 2 diabetes mellitus with diabetic nephropathy, without long-term current use of insulin ([facility])  Essential hypertension  Pure hypercholesterolemia  Other specified hypothyroidism  Blood pressure today is acceptable.  Diabetes test shows adequate control.  Cholesterol is adequately controlled except for her slightly low HDL cholesterol.  TSH is within therapeutic range.  I recommended no changes in her medication at this time.  However I did recommend therapeutic lifestyle changes including diet exercise and weight loss.  If fatigue worsens, we could consider increasing her levothyroxine slightly to try to drive her TSH to closer to 1.5.  Also consider screening for obstructive sleep apnea.  Reassess the patient in 6 months or sooner if worsening.  Patient's immunizations are up-to-date.  She also has a head cold today.  I recommended using Xyzal for head congestion.  She can also use Tessalon Perles 200 mg every 8 hours as needed for cough

## 2018-09-30 ENCOUNTER — Other Ambulatory Visit: Payer: Self-pay | Admitting: Family Medicine

## 2018-09-30 ENCOUNTER — Telehealth: Payer: Self-pay | Admitting: Family Medicine

## 2018-09-30 MED ORDER — AZITHROMYCIN 250 MG PO TABS: ORAL_TABLET | ORAL | 0 refills | Status: DC

## 2018-09-30 NOTE — Telephone Encounter (Signed)
Pt called and states that she still has the crude - her ears still hurting and it has not gotten any better and would like to know if you would call something in for her????

## 2018-11-04 ENCOUNTER — Other Ambulatory Visit: Payer: Self-pay | Admitting: Family Medicine

## 2018-11-13 ENCOUNTER — Other Ambulatory Visit: Payer: Self-pay | Admitting: Family Medicine

## 2018-11-16 ENCOUNTER — Other Ambulatory Visit: Payer: Self-pay | Admitting: Family Medicine

## 2018-12-09 ENCOUNTER — Other Ambulatory Visit: Payer: Self-pay | Admitting: Family Medicine

## 2019-01-23 ENCOUNTER — Other Ambulatory Visit: Payer: Self-pay | Admitting: Family Medicine

## 2019-01-23 MED ORDER — PIOGLITAZONE HCL 30 MG PO TABS: 30.0000 mg | ORAL_TABLET | Freq: Every day | ORAL | 1 refills | Status: DC

## 2019-01-30 ENCOUNTER — Other Ambulatory Visit: Payer: Self-pay | Admitting: Family Medicine

## 2019-02-02 ENCOUNTER — Other Ambulatory Visit: Payer: Self-pay | Admitting: Family Medicine

## 2019-04-30 ENCOUNTER — Other Ambulatory Visit: Payer: Self-pay | Admitting: Family Medicine

## 2019-05-02 ENCOUNTER — Other Ambulatory Visit: Payer: Self-pay | Admitting: Family Medicine

## 2019-05-06 ENCOUNTER — Other Ambulatory Visit: Payer: Self-pay | Admitting: *Deleted

## 2019-05-06 MED ORDER — ALLOPURINOL 100 MG PO TABS: 200.0000 mg | ORAL_TABLET | Freq: Every day | ORAL | 1 refills | Status: DC

## 2019-06-05 ENCOUNTER — Other Ambulatory Visit: Payer: Medicare Other

## 2019-06-05 ENCOUNTER — Other Ambulatory Visit: Payer: Self-pay

## 2019-06-05 DIAGNOSIS — N183 Chronic kidney disease, stage 3 unspecified: Secondary | ICD-10-CM

## 2019-06-05 DIAGNOSIS — E038 Other specified hypothyroidism: Secondary | ICD-10-CM

## 2019-06-05 DIAGNOSIS — E1121 Type 2 diabetes mellitus with diabetic nephropathy: Secondary | ICD-10-CM

## 2019-06-05 DIAGNOSIS — I10 Essential (primary) hypertension: Secondary | ICD-10-CM

## 2019-06-05 DIAGNOSIS — E7800 Pure hypercholesterolemia, unspecified: Secondary | ICD-10-CM

## 2019-06-10 LAB — COMPREHENSIVE METABOLIC PANEL
AG Ratio: 1.6 (calc) (ref 1.0–2.5)
ALT: 9 U/L (ref 6–29)
AST: 14 U/L (ref 10–35)
Albumin: 4.3 g/dL (ref 3.6–5.1)
Alkaline phosphatase (APISO): 48 U/L (ref 37–153)
BUN/Creatinine Ratio: 18 (calc) (ref 6–22)
BUN: 20 mg/dL (ref 7–25)
CO2: 28 mmol/L (ref 20–32)
Calcium: 9.9 mg/dL (ref 8.6–10.4)
Chloride: 102 mmol/L (ref 98–110)
Creat: 1.14 mg/dL — ABNORMAL HIGH (ref 0.60–0.93)
Globulin: 2.7 g/dL (calc) (ref 1.9–3.7)
Glucose, Bld: 125 mg/dL — ABNORMAL HIGH (ref 65–99)
Potassium: 5 mmol/L (ref 3.5–5.3)
Sodium: 140 mmol/L (ref 135–146)
Total Bilirubin: 0.3 mg/dL (ref 0.2–1.2)
Total Protein: 7 g/dL (ref 6.1–8.1)

## 2019-06-10 LAB — CBC WITH DIFFERENTIAL/PLATELET
Absolute Monocytes: 820 cells/uL (ref 200–950)
Basophils Absolute: 90 cells/uL (ref 0–200)
Basophils Relative: 0.9 %
Eosinophils Absolute: 190 cells/uL (ref 15–500)
Eosinophils Relative: 1.9 %
HCT: 38.1 % (ref 35.0–45.0)
Hemoglobin: 12.5 g/dL (ref 11.7–15.5)
Lymphs Abs: 2250 cells/uL (ref 850–3900)
MCH: 30.6 pg (ref 27.0–33.0)
MCHC: 32.8 g/dL (ref 32.0–36.0)
MCV: 93.4 fL (ref 80.0–100.0)
MPV: 10.6 fL (ref 7.5–12.5)
Monocytes Relative: 8.2 %
Neutro Abs: 6650 cells/uL (ref 1500–7800)
Neutrophils Relative %: 66.5 %
Platelets: 338 10*3/uL (ref 140–400)
RBC: 4.08 10*6/uL (ref 3.80–5.10)
RDW: 13 % (ref 11.0–15.0)
Total Lymphocyte: 22.5 %
WBC: 10 10*3/uL (ref 3.8–10.8)

## 2019-06-10 LAB — HEMOGLOBIN A1C
Hgb A1c MFr Bld: 6.2 % of total Hgb — ABNORMAL HIGH (ref <5.7)
Mean Plasma Glucose: 131 (calc)
eAG (mmol/L): 7.3 (calc)

## 2019-06-10 LAB — LIPID PANEL
Cholesterol: 146 mg/dL (ref <200)
HDL: 38 mg/dL — ABNORMAL LOW (ref >=50)
LDL Cholesterol (Calc): 84 mg/dL (calc)
Non-HDL Cholesterol (Calc): 108 mg/dL (calc) (ref <130)
Total CHOL/HDL Ratio: 3.8 (calc) (ref <5.0)
Triglycerides: 144 mg/dL (ref <150)

## 2019-06-10 LAB — TEST AUTHORIZATION

## 2019-06-10 LAB — TSH: TSH: 4 mIU/L (ref 0.40–4.50)

## 2019-06-11 ENCOUNTER — Other Ambulatory Visit: Payer: Self-pay | Admitting: Family Medicine

## 2019-06-11 MED ORDER — FLUCONAZOLE 150 MG PO TABS: 150.0000 mg | ORAL_TABLET | Freq: Once | ORAL | 1 refills | Status: AC

## 2019-06-26 LAB — HM DIABETES EYE EXAM

## 2019-07-03 ENCOUNTER — Other Ambulatory Visit: Payer: Self-pay | Admitting: Family Medicine

## 2019-07-18 LAB — HM MAMMOGRAPHY

## 2019-07-22 ENCOUNTER — Other Ambulatory Visit: Payer: Self-pay | Admitting: Family Medicine

## 2019-07-26 ENCOUNTER — Other Ambulatory Visit: Payer: Self-pay | Admitting: Family Medicine

## 2019-07-29 ENCOUNTER — Other Ambulatory Visit: Payer: Self-pay | Admitting: Family Medicine

## 2019-08-02 ENCOUNTER — Other Ambulatory Visit: Payer: Self-pay | Admitting: Family Medicine

## 2019-08-18 ENCOUNTER — Other Ambulatory Visit: Payer: Self-pay | Admitting: Family Medicine

## 2019-08-18 MED ORDER — LEVOTHYROXINE SODIUM 75 MCG PO TABS: ORAL_TABLET | ORAL | 3 refills | Status: DC

## 2019-09-11 ENCOUNTER — Telehealth: Payer: Self-pay | Admitting: Family Medicine

## 2019-10-26 ENCOUNTER — Other Ambulatory Visit: Payer: Self-pay | Admitting: Family Medicine

## 2019-10-27 ENCOUNTER — Other Ambulatory Visit: Payer: Self-pay | Admitting: Family Medicine

## 2019-11-18 ENCOUNTER — Ambulatory Visit: Payer: Medicare Other

## 2019-11-29 ENCOUNTER — Ambulatory Visit: Payer: Medicare Other

## 2020-01-06 ENCOUNTER — Other Ambulatory Visit: Payer: Self-pay

## 2020-01-06 ENCOUNTER — Other Ambulatory Visit: Payer: Self-pay | Admitting: Family Medicine

## 2020-01-06 ENCOUNTER — Other Ambulatory Visit: Payer: Medicare Other

## 2020-01-06 DIAGNOSIS — E1121 Type 2 diabetes mellitus with diabetic nephropathy: Secondary | ICD-10-CM

## 2020-01-06 DIAGNOSIS — R5383 Other fatigue: Secondary | ICD-10-CM

## 2020-01-06 DIAGNOSIS — E038 Other specified hypothyroidism: Secondary | ICD-10-CM

## 2020-01-06 DIAGNOSIS — I10 Essential (primary) hypertension: Secondary | ICD-10-CM

## 2020-01-06 DIAGNOSIS — Z8639 Personal history of other endocrine, nutritional and metabolic disease: Secondary | ICD-10-CM

## 2020-01-06 DIAGNOSIS — E7800 Pure hypercholesterolemia, unspecified: Secondary | ICD-10-CM

## 2020-01-06 LAB — VITAMIN D 25 HYDROXY (VIT D DEFICIENCY, FRACTURES): Vit D, 25-Hydroxy: 33 ng/mL (ref 30–100)

## 2020-01-07 LAB — COMPREHENSIVE METABOLIC PANEL
AG Ratio: 1.6 (calc) (ref 1.0–2.5)
ALT: 12 U/L (ref 6–29)
AST: 15 U/L (ref 10–35)
Albumin: 4.1 g/dL (ref 3.6–5.1)
Alkaline phosphatase (APISO): 50 U/L (ref 37–153)
BUN/Creatinine Ratio: 16 (calc) (ref 6–22)
BUN: 16 mg/dL (ref 7–25)
CO2: 28 mmol/L (ref 20–32)
Calcium: 9.8 mg/dL (ref 8.6–10.4)
Chloride: 103 mmol/L (ref 98–110)
Creat: 1.03 mg/dL — ABNORMAL HIGH (ref 0.60–0.93)
Globulin: 2.6 g/dL (calc) (ref 1.9–3.7)
Glucose, Bld: 125 mg/dL — ABNORMAL HIGH (ref 65–99)
Potassium: 5.3 mmol/L (ref 3.5–5.3)
Sodium: 142 mmol/L (ref 135–146)
Total Bilirubin: 0.3 mg/dL (ref 0.2–1.2)
Total Protein: 6.7 g/dL (ref 6.1–8.1)

## 2020-01-07 LAB — HEMOGLOBIN A1C
Hgb A1c MFr Bld: 6.2 % of total Hgb — ABNORMAL HIGH (ref <5.7)
Mean Plasma Glucose: 131 (calc)
eAG (mmol/L): 7.3 (calc)

## 2020-01-07 LAB — CBC WITH DIFFERENTIAL/PLATELET
Absolute Monocytes: 608 cells/uL (ref 200–950)
Basophils Absolute: 68 cells/uL (ref 0–200)
Basophils Relative: 0.9 %
Eosinophils Absolute: 160 cells/uL (ref 15–500)
Eosinophils Relative: 2.1 %
HCT: 37.8 % (ref 35.0–45.0)
Hemoglobin: 12.3 g/dL (ref 11.7–15.5)
Lymphs Abs: 2052 cells/uL (ref 850–3900)
MCH: 30.5 pg (ref 27.0–33.0)
MCHC: 32.5 g/dL (ref 32.0–36.0)
MCV: 93.8 fL (ref 80.0–100.0)
MPV: 10.6 fL (ref 7.5–12.5)
Monocytes Relative: 8 %
Neutro Abs: 4712 cells/uL (ref 1500–7800)
Neutrophils Relative %: 62 %
Platelets: 312 10*3/uL (ref 140–400)
RBC: 4.03 10*6/uL (ref 3.80–5.10)
RDW: 12.8 % (ref 11.0–15.0)
Total Lymphocyte: 27 %
WBC: 7.6 10*3/uL (ref 3.8–10.8)

## 2020-01-07 LAB — LIPID PANEL
Cholesterol: 148 mg/dL (ref <200)
HDL: 38 mg/dL — ABNORMAL LOW (ref >=50)
LDL Cholesterol (Calc): 86 mg/dL (calc)
Non-HDL Cholesterol (Calc): 110 mg/dL (calc) (ref <130)
Total CHOL/HDL Ratio: 3.9 (calc) (ref <5.0)
Triglycerides: 140 mg/dL (ref <150)

## 2020-01-07 LAB — TSH: TSH: 3.56 mIU/L (ref 0.40–4.50)

## 2020-01-15 ENCOUNTER — Other Ambulatory Visit: Payer: Self-pay

## 2020-01-15 ENCOUNTER — Ambulatory Visit: Payer: Medicare Other | Admitting: Family Medicine

## 2020-01-15 ENCOUNTER — Encounter: Payer: Self-pay | Admitting: Family Medicine

## 2020-01-15 VITALS — BP 162/88 | HR 88 | Temp 97.1°F | Resp 16 | Ht 63.0 in | Wt 233.0 lb

## 2020-01-15 DIAGNOSIS — I10 Essential (primary) hypertension: Secondary | ICD-10-CM

## 2020-01-15 DIAGNOSIS — R0989 Other specified symptoms and signs involving the circulatory and respiratory systems: Secondary | ICD-10-CM

## 2020-01-15 DIAGNOSIS — Z8639 Personal history of other endocrine, nutritional and metabolic disease: Secondary | ICD-10-CM

## 2020-01-15 DIAGNOSIS — E1121 Type 2 diabetes mellitus with diabetic nephropathy: Secondary | ICD-10-CM

## 2020-01-15 DIAGNOSIS — E7800 Pure hypercholesterolemia, unspecified: Secondary | ICD-10-CM | POA: Diagnosis not present

## 2020-01-15 DIAGNOSIS — E038 Other specified hypothyroidism: Secondary | ICD-10-CM | POA: Diagnosis not present

## 2020-01-15 NOTE — Progress Notes (Signed)
Subjective:    Patient ID: Whitney Morales, female    DOB: 30-Jun-1941, 79 y.o.   MRN: [id number]  Medication Refill  Patient is here today for follow-up.  Patient has not been seen in over a year primarily because she has been quarantining at home due to COVID-19.  Thankfully she has had her vaccinations and is starting to feel more comfortable venturing outside the home.  Unfortunately she just had a relatively as part of her feet due to diabetes and therefore she is very concerned about her diabetic foot exam.  She denies any neuropathy and she has normal sensation to 10 g monofilament bilaterally.  She has excellent strong posterior tibialis pulses bilaterally however I am having a difficult time palpating her dorsalis pedis on her left foot.  She also has decreased capillary refill in her toes.  Therefore I believe it would be appropriate to obtain a vascular ultrasound with ABIs given her risk factors.  Her blood pressure is very high today however she believes this is whitecoat syndrome.  She has been checking it at home and finding it to be less than 140/90.  She would like to start checking it more frequently at home and then let me know if it is consistently over 140 on the top of 90 on the bottom.  She denies any chest pain shortness of breath or dyspnea on exertion.  She denies any myalgias right upper quadrant pain.  However she has been experiencing early a.m. hypoglycemia.  Her hemoglobin A1c is less than 6.2 and therefore I do believe it is reasonable to try to discontinue one of her medications.  We discussed either stopping Januvia versus Actos.  She would like to stop Actos due to the weight gain that she has experienced since starting it.  Her recent lab work is listed below.  Lab on 01/06/2020  Component Date Value Ref Range Status  . Cholesterol 01/06/2020 148  <200 mg/dL Final  . HDL 01/06/2020 38* > OR = 50 mg/dL Final  . Triglycerides 01/06/2020 140  <150 mg/dL Final  . LDL  Cholesterol (Calc) 01/06/2020 86  mg/dL (calc) Final   Comment: Reference range: <100 . Desirable range <100 mg/dL for primary prevention;   <70 mg/dL for patients with CHD or diabetic patients  with > or = 2 CHD risk factors. Marland Kitchen LDL-C is now calculated using the Martin-Hopkins  calculation, which is a validated novel method providing  better accuracy than the Friedewald equation in the  estimation of LDL-C.  Cresenciano Genre et al. Annamaria Helling. 7939;030(09): 2061-2068  (http://education.[facility].com/faq/FAQ164)   . Total CHOL/HDL Ratio 01/06/2020 3.9  <5.0 (calc) Final  . Non-HDL Cholesterol (Calc) 01/06/2020 110  <130 mg/dL (calc) Final   Comment: For patients with diabetes plus 1 major ASCVD risk  factor, treating to a non-HDL-C goal of <100 mg/dL  (LDL-C of <70 mg/dL) is considered a therapeutic  option.   . Glucose, Bld 01/06/2020 125* 65 - 99 mg/dL Final   Comment: .            Fasting reference interval . For someone without known diabetes, a glucose value between 100 and 125 mg/dL is consistent with prediabetes and should be confirmed with a follow-up test. .   . BUN 01/06/2020 16  7 - 25 mg/dL Final  . Creat 01/06/2020 1.03* 0.60 - 0.93 mg/dL Final   Comment: For patients >69 years of age, the reference limit for Creatinine is approximately 13% higher for people identified  as African-American. .   Havery Moros Ratio 01/06/2020 16  6 - 22 (calc) Final  . Sodium 01/06/2020 142  135 - 146 mmol/L Final  . Potassium 01/06/2020 5.3  3.5 - 5.3 mmol/L Final  . Chloride 01/06/2020 103  98 - 110 mmol/L Final  . CO2 01/06/2020 28  20 - 32 mmol/L Final  . Calcium 01/06/2020 9.8  8.6 - 10.4 mg/dL Final  . Total Protein 01/06/2020 6.7  6.1 - 8.1 g/dL Final  . Albumin 01/06/2020 4.1  3.6 - 5.1 g/dL Final  . Globulin 01/06/2020 2.6  1.9 - 3.7 g/dL (calc) Final  . AG Ratio 01/06/2020 1.6  1.0 - 2.5 (calc) Final  . Total Bilirubin 01/06/2020 0.3  0.2 - 1.2 mg/dL Final  . Alkaline  phosphatase (APISO) 01/06/2020 50  37 - 153 U/L Final  . AST 01/06/2020 15  10 - 35 U/L Final  . ALT 01/06/2020 12  6 - 29 U/L Final  . WBC 01/06/2020 7.6  3.8 - 10.8 Thousand/uL Final  . RBC 01/06/2020 4.03  3.80 - 5.10 Million/uL Final  . Hemoglobin 01/06/2020 12.3  11.7 - 15.5 g/dL Final  . HCT 01/06/2020 37.8  35.0 - 45.0 % Final  . MCV 01/06/2020 93.8  80.0 - 100.0 fL Final  . MCH 01/06/2020 30.5  27.0 - 33.0 pg Final  . MCHC 01/06/2020 32.5  32.0 - 36.0 g/dL Final  . RDW 01/06/2020 12.8  11.0 - 15.0 % Final  . Platelets 01/06/2020 312  140 - 400 Thousand/uL Final  . MPV 01/06/2020 10.6  7.5 - 12.5 fL Final  . Neutro Abs 01/06/2020 4,712  1,500 - 7,800 cells/uL Final  . Lymphs Abs 01/06/2020 2,052  850 - 3,900 cells/uL Final  . Absolute Monocytes 01/06/2020 608  200 - 950 cells/uL Final  . Eosinophils Absolute 01/06/2020 160  15 - 500 cells/uL Final  . Basophils Absolute 01/06/2020 68  0 - 200 cells/uL Final  . Neutrophils Relative % 01/06/2020 62  % Final  . Total Lymphocyte 01/06/2020 27.0  % Final  . Monocytes Relative 01/06/2020 8.0  % Final  . Eosinophils Relative 01/06/2020 2.1  % Final  . Basophils Relative 01/06/2020 0.9  % Final  . Hgb A1c MFr Bld 01/06/2020 6.2* <5.7 % of total Hgb Final   Comment: For someone without known diabetes, a hemoglobin  A1c value between 5.7% and 6.4% is consistent with prediabetes and should be confirmed with a  follow-up test. . For someone with known diabetes, a value <7% indicates that their diabetes is well controlled. A1c targets should be individualized based on duration of diabetes, age, comorbid conditions, and other considerations. . This assay result is consistent with an increased risk of diabetes. . Currently, no consensus exists regarding use of hemoglobin A1c for diagnosis of diabetes for children. .   . Mean Plasma Glucose 01/06/2020 131  (calc) Final  . eAG (mmol/L) 01/06/2020 7.3  (calc) Final  . TSH 01/06/2020  3.56  0.40 - 4.50 mIU/L Final  . Vit D, 25-Hydroxy 01/06/2020 33  30 - 100 ng/mL Final   Comment: Vitamin D Status         25-OH Vitamin D: . Deficiency:                    <20 ng/mL Insufficiency:             20 - 29 ng/mL Optimal:                 >  or = 30 ng/mL . For 25-OH Vitamin D testing on patients on  D2-supplementation and patients for whom quantitation  of D2 and D3 fractions is required, the [facility](TM) 25-OH VIT D, (D2,D3), LC/MS/MS is recommended: order  code [id number] (patients >46yr). See Note 1 . Note 1 . For additional information, please refer to  http://education.[facility].com/faq/FAQ199  (This link is being provided for informational/ educational purposes only.)     Past Medical History:  Diagnosis Date  . Abnormal finding on EKG 10/29/2012   FOLLOW UP NUCLEAR STRESS TEST ON 11/05/12 -NORMAL  . Corneal erosion 2012   RIGHT EYE--HAS RESOLVED--NO PROBLEMS SINCE  . Diabetes mellitus   . Diverticulitis   . Fatigue   . GERD (gastroesophageal reflux disease)    PAST HX--NO PROBLEMS NOW-PT DOES TAKE DAILY TUMS AT BEDTIME  . Goiter    cyst vs goiter on thyroid  . H/O hiatal hernia   . High cholesterol   . Hypertension   . Obesity   . Osteopenia    Past Surgical History:  Procedure Laterality Date  . ABDOMINAL HYSTERECTOMY    . BREAST LUMPECTOMY     BENIGN  . CARDIOVASCULAR STRESS TEST  06/08/2009   EF 78%, NO ISCHEMIA  . THYROID LOBECTOMY  11/27/2012   Procedure: THYROID LOBECTOMY;  Surgeon: ARalene Ok MD;  Location: [facility];  Service: General;  Laterality: Right;  Right Thyroid Lobectomy  . TONSILLECTOMY    . [place]ARDIOGRAPHY  03/23/2006   EF 55-60%   Current Outpatient Medications on File Prior to Visit  Medication Sig Dispense Refill  . ACCU-CHEK AVIVA PLUS test strip USE 2 (TWO) TIMES DAILY. CHECK BLOOD SUGAR TWICE DAILY FASTING IN MORNING, 2 HRS AFTER A MEAL E11.65 100 strip 3  . Accu-Chek Softclix Lancets lancets USE AS DIRECTED TO  CHECK SUGAR 100 each 0  . allopurinol (ZYLOPRIM) 100 MG tablet TAKE 2 TABLETS BY MOUTH EVERY DAY 180 tablet 1  . azithromycin (ZITHROMAX) 250 MG tablet 2 tabs poqday1, 1 tab poqday 2-5 6 tablet 0  . benzonatate (TESSALON) 200 MG capsule Take 1 capsule (200 mg total) by mouth 3 (three) times daily as needed for cough. 20 capsule 0  . Blood Glucose Monitoring Suppl (ACCU-CHEK AVIVA PLUS) W/DEVICE KIT PT NEEDS METER-STRIPS(1 BOTTLE =100/5 REFILLS)-LANCETS(1 BOX/5 REFILLS) CHECKS BS QD DX: 250.00 1 kit 0  . Calcium Carbonate Antacid (TUMS PO) Take by mouth at bedtime. RARELY WOULD NEED ADDITIONAL TUMS DURING DAY IF SHE ATE SPICY FOODS    . colchicine 0.6 MG tablet TAKE 1 TABLET DAILY. TAKE 2 TABS IMMEDIATELY THEN 1 IN AN HOUR IF STILL HURTING 90 tablet 1  . JANUVIA 100 MG tablet TAKE 1 TABLET BY MOUTH EVERY DAY 90 tablet 2  . levothyroxine (SYNTHROID) 75 MCG tablet TAKE 1 TABLET BY MOUTH ONCE DAILY BEFORE BREAKFAST 90 tablet 3  . lisinopril-hydrochlorothiazide (PRINZIDE,ZESTORETIC) 20-25 MG tablet TAKE ONE TABLET BY MOUTH DAILY 90 tablet 3  . meloxicam (MOBIC) 15 MG tablet TAKE 1 TABLET (15 MG TOTAL) BY MOUTH DAILY. 90 tablet 3  . metFORMIN (GLUCOPHAGE) 500 MG tablet TAKE TWO TABLETS BY MOUTH TWICE A DAY WITH A MEAL 360 tablet 1  . Multiple Vitamin (MULTIVITAMIN WITH MINERALS) TABS Take 1 tablet by mouth daily.    . pioglitazone (ACTOS) 30 MG tablet TAKE 1 TABLET BY MOUTH EVERY DAY 90 tablet 1  . simvastatin (ZOCOR) 80 MG tablet TAKE 1/2 TABLET BY MOUTH EVERY NIGHT AT BEDTIME 45 tablet 0   No  current facility-administered medications on file prior to visit.   Allergies  Allergen Reactions  . Clindamycin/Lincomycin     CLINDAMYCIN TAKEN WITH CIPRO CAUSED SEVERE NAUSEA--PT STATES SHE CAN TAKE CIPRO ALONE WITHOUT PROBLEM--IT WAS JUST THE COMBINATION SHE DID NOT TOLERATE.  Marland Kitchen Levaquin [Levofloxacin] Nausea And Vomiting  . Metronidazole Hcl Other (See Comments)    Deathly sick WHEN PT TOOK  METRONIDAZOLE WITH CIPRO.  PT STATES SHE HAS TAKEN CIPRO ALONE WITHOUT PROBLEM --JUST DID NOT TOLERATE THE COMBINATION OF BOTH DRUGS TAKEN TOGETHER.   Social History   Socioeconomic History  . Marital status: Married    Spouse name: Not on file  . Number of children: Not on file  . Years of education: Not on file  . Highest education level: Not on file  Occupational History  . Not on file  Tobacco Use  . Smoking status: Never Smoker  . Smokeless tobacco: Never Used  Substance and Sexual Activity  . Alcohol use: No  . Drug use: No  . Sexual activity: Not on file  Other Topics Concern  . Not on file  Social History Narrative  . Not on file   Social Determinants of Health   Financial Resource Strain:   . Difficulty of Paying Living Expenses:   Food Insecurity:   . Worried About Charity fundraiser in the Last Year:   . Arboriculturist in the Last Year:   Transportation Needs:   . Film/video editor (Medical):   Marland Kitchen Lack of Transportation (Non-Medical):   Physical Activity:   . Days of Exercise per Week:   . Minutes of Exercise per Session:   Stress:   . Feeling of Stress :   Social Connections:   . Frequency of Communication with Friends and Family:   . Frequency of Social Gatherings with Friends and Family:   . Attends Religious Services:   . Active Member of Clubs or Organizations:   . Attends Archivist Meetings:   Marland Kitchen Marital Status:   Intimate Partner Violence:   . Fear of Current or Ex-Partner:   . Emotionally Abused:   Marland Kitchen Physically Abused:   . Sexually Abused:    No family history on file.     Review of Systems  All other systems reviewed and are negative.      Objective:   Physical Exam  Constitutional: She is oriented to person, place, and time. She appears well-developed and well-nourished. No distress.  HENT:  Right Ear: External ear normal.  Left Ear: External ear normal.  Nose: Nose normal.  Mouth/Throat: Oropharynx is clear and  moist.  Neck: No JVD present. No thyromegaly present.  Cardiovascular: Normal rate, regular rhythm, normal heart sounds and intact distal pulses.  No murmur heard. Pulmonary/Chest: Effort normal and breath sounds normal. No respiratory distress. She has no wheezes. She has no rales.  Abdominal: Soft. Bowel sounds are normal. She exhibits no distension and no mass. There is no abdominal tenderness. There is no rebound and no guarding.  Musculoskeletal:        General: No edema.     Cervical back: Neck supple.  Neurological: She is alert and oriented to person, place, and time. No cranial nerve deficit. She exhibits normal muscle tone. Coordination normal.  Skin: No rash noted. She is not diaphoretic. No erythema.  Vitals reviewed.         Assessment & Plan:  Controlled type 2 diabetes mellitus with diabetic nephropathy, without long-term current  use of insulin ([place])  Essential hypertension  Pure hypercholesterolemia  Other specified hypothyroidism  History of vitamin D deficiency  Vitamin D is borderline low.  Therefore of asked the patient to start taking vitamin D 2000 units a day.  Her TSH is well within normal limits and therefore there are no changes that need to be made in her levothyroxine.  Her LDL cholesterol is outstanding although I did recommend that she exercise 30 minutes a day to try to increase her HDL cholesterol.  Diabetes is well controlled with an A1c of 6.2.  We will discontinue Actos for reasons listed above and then recheck hemoglobin A1c in 3 months.  I will schedule the patient for arterial Dopplers with ABIs to evaluate for the presence of peripheral vascular disease.  Her blood pressure is my biggest concern today.  She will check it twice a day and then report the values to me in 1 week to determine if we need to add additional medication.

## 2020-01-25 ENCOUNTER — Other Ambulatory Visit: Payer: Self-pay | Admitting: Family Medicine

## 2020-01-28 ENCOUNTER — Other Ambulatory Visit: Payer: Self-pay

## 2020-01-28 ENCOUNTER — Ambulatory Visit (HOSPITAL_COMMUNITY)
Admission: RE | Admit: 2020-01-28 | Discharge: 2020-01-28 | Disposition: A | Discharge status: Home / Self Care | Payer: Medicare Other | Source: Ambulatory Visit | Attending: Family Medicine | Admitting: Family Medicine

## 2020-01-28 DIAGNOSIS — R0989 Other specified symptoms and signs involving the circulatory and respiratory systems: Secondary | ICD-10-CM | POA: Diagnosis present

## 2020-04-20 ENCOUNTER — Other Ambulatory Visit: Payer: Self-pay | Admitting: Family Medicine

## 2020-04-20 ENCOUNTER — Other Ambulatory Visit: Payer: Self-pay

## 2020-04-20 MED ORDER — ALLOPURINOL 100 MG PO TABS: 200.0000 mg | ORAL_TABLET | Freq: Every day | ORAL | 1 refills | Status: DC

## 2020-05-19 ENCOUNTER — Other Ambulatory Visit: Payer: Medicare Other

## 2020-05-19 ENCOUNTER — Other Ambulatory Visit: Payer: Self-pay

## 2020-05-19 DIAGNOSIS — E1121 Type 2 diabetes mellitus with diabetic nephropathy: Secondary | ICD-10-CM

## 2020-05-20 LAB — BASIC METABOLIC PANEL
BUN/Creatinine Ratio: 13 (calc) (ref 6–22)
BUN: 15 mg/dL (ref 7–25)
CO2: 27 mmol/L (ref 20–32)
Calcium: 9.7 mg/dL (ref 8.6–10.4)
Chloride: 103 mmol/L (ref 98–110)
Creat: 1.16 mg/dL — ABNORMAL HIGH (ref 0.60–0.93)
Glucose, Bld: 202 mg/dL — ABNORMAL HIGH (ref 65–99)
Potassium: 5 mmol/L (ref 3.5–5.3)
Sodium: 141 mmol/L (ref 135–146)

## 2020-05-20 LAB — HEMOGLOBIN A1C
Hgb A1c MFr Bld: 7.8 % of total Hgb — ABNORMAL HIGH (ref <5.7)
Mean Plasma Glucose: 177 (calc)
eAG (mmol/L): 9.8 (calc)

## 2020-05-27 ENCOUNTER — Ambulatory Visit: Payer: Medicare Other | Admitting: Family Medicine

## 2020-05-27 ENCOUNTER — Other Ambulatory Visit: Payer: Self-pay

## 2020-05-27 VITALS — BP 140/68 | HR 85 | Temp 97.3°F | Ht 63.0 in | Wt 228.0 lb

## 2020-05-27 DIAGNOSIS — E1121 Type 2 diabetes mellitus with diabetic nephropathy: Secondary | ICD-10-CM | POA: Diagnosis not present

## 2020-05-27 DIAGNOSIS — I10 Essential (primary) hypertension: Secondary | ICD-10-CM

## 2020-05-27 MED ORDER — FLUCONAZOLE 150 MG PO TABS
150.0000 mg | ORAL_TABLET | Freq: Once | ORAL | 0 refills | Status: AC
Start: 2020-05-27 — End: 2020-05-27

## 2020-05-27 MED ORDER — CLOTRIMAZOLE-BETAMETHASONE 1-0.05 % EX CREA: 1.0000 "application " | TOPICAL_CREAM | Freq: Two times a day (BID) | CUTANEOUS | 3 refills | Status: DC

## 2020-05-27 MED ORDER — PIOGLITAZONE HCL 30 MG PO TABS: 30.0000 mg | ORAL_TABLET | Freq: Every day | ORAL | 3 refills | Status: DC

## 2020-05-27 NOTE — Progress Notes (Signed)
Subjective:    Patient ID: Whitney Morales, female    DOB: 12-Oct-1941, 79 y.o.   MRN: [id number]  Please see the patient's last office visit.  At that time, we discontinued Actos per the patient's request due to the fact her A1c was 6.2.  Unfortunately her A1c has risen to 7.8.  She denies any polyuria, polydipsia, blurry vision.  She denies any chest pain or shortness of breath or dyspnea on exertion.  She has had her Covid vaccine.  Blood pressure today is acceptable at 140/68.  She did not see any significant weight loss after discontinuing Actos.  Today she is complaining of a yeast like rash underneath both breast and in the creases of both legs.  The rash consists of a patch of erythema with satellite erythematous macules and scaling and is extremely itchy.  Rashes consistent with Candida intertrigo.  Lab on 05/19/2020  Component Date Value Ref Range Status  . Hgb A1c MFr Bld 05/19/2020 7.8* <5.7 % of total Hgb Final   Comment: For someone without known diabetes, a hemoglobin A1c value of 6.5% or greater indicates that they may have  diabetes and this should be confirmed with a follow-up  test. . For someone with known diabetes, a value <7% indicates  that their diabetes is well controlled and a value  greater than or equal to 7% indicates suboptimal  control. A1c targets should be individualized based on  duration of diabetes, age, comorbid conditions, and  other considerations. . Currently, no consensus exists regarding use of hemoglobin A1c for diagnosis of diabetes for children. .   . Mean Plasma Glucose 05/19/2020 177  (calc) Final  . eAG (mmol/L) 05/19/2020 9.8  (calc) Final  . Glucose, Bld 05/19/2020 202* 65 - 99 mg/dL Final   Comment: .            Fasting reference interval . For someone without known diabetes, a glucose value >125 mg/dL indicates that they may have diabetes and this should be confirmed with a follow-up test. .   . BUN 05/19/2020 15  7 - 25 mg/dL  Final  . Creat 05/19/2020 1.16* 0.60 - 0.93 mg/dL Final   Comment: For patients >30 years of age, the reference limit for Creatinine is approximately 13% higher for people identified as African-American. .   Havery Moros Ratio 05/19/2020 13  6 - 22 (calc) Final  . Sodium 05/19/2020 141  135 - 146 mmol/L Final  . Potassium 05/19/2020 5.0  3.5 - 5.3 mmol/L Final  . Chloride 05/19/2020 103  98 - 110 mmol/L Final  . CO2 05/19/2020 27  20 - 32 mmol/L Final  . Calcium 05/19/2020 9.7  8.6 - 10.4 mg/dL Final    Past Medical History:  Diagnosis Date  . Abnormal finding on EKG 10/29/2012   FOLLOW UP NUCLEAR STRESS TEST ON 11/05/12 -NORMAL  . Corneal erosion 2012   RIGHT EYE--HAS RESOLVED--NO PROBLEMS SINCE  . Diabetes mellitus   . Diverticulitis   . Fatigue   . GERD (gastroesophageal reflux disease)    PAST HX--NO PROBLEMS NOW-PT DOES TAKE DAILY TUMS AT BEDTIME  . Goiter    cyst vs goiter on thyroid  . H/O hiatal hernia   . High cholesterol   . Hypertension   . Obesity   . Osteopenia    Past Surgical History:  Procedure Laterality Date  . ABDOMINAL HYSTERECTOMY    . BREAST LUMPECTOMY     BENIGN  . CARDIOVASCULAR STRESS TEST  06/08/2009   EF 78%, NO ISCHEMIA  . THYROID LOBECTOMY  11/27/2012   Procedure: THYROID LOBECTOMY;  Surgeon: Ralene Ok, MD;  Location: [facility];  Service: General;  Laterality: Right;  Right Thyroid Lobectomy  . TONSILLECTOMY    . US ECHOCARDIOGRAPHY  03/23/2006   EF 55-60%   Current Outpatient Medications on File Prior to Visit  Medication Sig Dispense Refill  . ACCU-CHEK AVIVA PLUS test strip USE 2 (TWO) TIMES DAILY. CHECK BLOOD SUGAR TWICE DAILY FASTING IN MORNING, 2 HRS AFTER A MEAL E11.65 100 strip 3  . Accu-Chek Softclix Lancets lancets USE AS DIRECTED TO CHECK SUGAR 100 each 0  . allopurinol (ZYLOPRIM) 100 MG tablet Take 2 tablets (200 mg total) by mouth daily. 180 tablet 1  . Blood Glucose Monitoring Suppl (ACCU-CHEK AVIVA PLUS) W/DEVICE KIT PT  NEEDS METER-STRIPS(1 BOTTLE =100/5 REFILLS)-LANCETS(1 BOX/5 REFILLS) CHECKS BS QD DX: 250.00 1 kit 0  . Calcium Carbonate Antacid (TUMS PO) Take by mouth at bedtime. RARELY WOULD NEED ADDITIONAL TUMS DURING DAY IF SHE ATE SPICY FOODS    . colchicine 0.6 MG tablet TAKE 1 TABLET DAILY. TAKE 2 TABS IMMEDIATELY THEN 1 IN AN HOUR IF STILL HURTING 90 tablet 1  . JANUVIA 100 MG tablet TAKE 1 TABLET BY MOUTH EVERY DAY 90 tablet 2  . levothyroxine (SYNTHROID) 75 MCG tablet TAKE 1 TABLET BY MOUTH ONCE DAILY BEFORE BREAKFAST 90 tablet 3  . lisinopril-hydrochlorothiazide (ZESTORETIC) 20-25 MG tablet TAKE ONE TABLET BY MOUTH DAILY 90 tablet 2  . metFORMIN (GLUCOPHAGE) 500 MG tablet TAKE TWO TABLETS BY MOUTH TWICE A DAY WITH A MEAL 360 tablet 2  . Multiple Vitamin (MULTIVITAMIN WITH MINERALS) TABS Take 1 tablet by mouth daily.    . simvastatin (ZOCOR) 80 MG tablet TAKE 1/2 TABLET BY MOUTH EVERY NIGHT AT BEDTIME 45 tablet 2   No current facility-administered medications on file prior to visit.   Allergies  Allergen Reactions  . Clindamycin/Lincomycin     CLINDAMYCIN TAKEN WITH CIPRO CAUSED SEVERE NAUSEA--PT STATES SHE CAN TAKE CIPRO ALONE WITHOUT PROBLEM--IT WAS JUST THE COMBINATION SHE DID NOT TOLERATE.  Marland Kitchen Levaquin [Levofloxacin] Nausea And Vomiting  . Metronidazole Hcl Other (See Comments)    Deathly sick WHEN PT TOOK METRONIDAZOLE WITH CIPRO.  PT STATES SHE HAS TAKEN CIPRO ALONE WITHOUT PROBLEM --JUST DID NOT TOLERATE THE COMBINATION OF BOTH DRUGS TAKEN TOGETHER.   Social History   Socioeconomic History  . Marital status: Married    Spouse name: Not on file  . Number of children: Not on file  . Years of education: Not on file  . Highest education level: Not on file  Occupational History  . Not on file  Tobacco Use  . Smoking status: Never Smoker  . Smokeless tobacco: Never Used  Substance and Sexual Activity  . Alcohol use: No  . Drug use: No  . Sexual activity: Not on file  Other Topics  Concern  . Not on file  Social History Narrative  . Not on file   Social Determinants of Health   Financial Resource Strain:   . Difficulty of Paying Living Expenses:   Food Insecurity:   . Worried About Charity fundraiser in the Last Year:   . Arboriculturist in the Last Year:   Transportation Needs:   . Film/video editor (Medical):   Marland Kitchen Lack of Transportation (Non-Medical):   Physical Activity:   . Days of Exercise per Week:   . Minutes of  Exercise per Session:   Stress:   . Feeling of Stress :   Social Connections:   . Frequency of Communication with Friends and Family:   . Frequency of Social Gatherings with Friends and Family:   . Attends Religious Services:   . Active Member of Clubs or Organizations:   . Attends Archivist Meetings:   Marland Kitchen Marital Status:   Intimate Partner Violence:   . Fear of Current or Ex-Partner:   . Emotionally Abused:   Marland Kitchen Physically Abused:   . Sexually Abused:    No family history on file.     Review of Systems  All other systems reviewed and are negative.      Objective:   Physical Exam Vitals reviewed.  Constitutional:      General: She is not in acute distress.    Appearance: She is well-developed. She is not diaphoretic.  HENT:     Right Ear: External ear normal.     Left Ear: External ear normal.     Nose: Nose normal.  Neck:     Thyroid: No thyromegaly.     Vascular: No JVD.  Cardiovascular:     Rate and Rhythm: Normal rate and regular rhythm.     Heart sounds: Normal heart sounds. No murmur heard.   Pulmonary:     Effort: Pulmonary effort is normal. No respiratory distress.     Breath sounds: Normal breath sounds. No wheezing or rales.  Abdominal:     General: Bowel sounds are normal. There is no distension.     Palpations: Abdomen is soft. There is no mass.     Tenderness: There is no abdominal tenderness. There is no guarding or rebound.  Musculoskeletal:     Cervical back: Neck supple.  Skin:     Findings: No erythema or rash.  Neurological:     Mental Status: She is alert and oriented to person, place, and time.     Cranial Nerves: No cranial nerve deficit.     Motor: No abnormal muscle tone.     Coordination: Coordination normal.           Assessment & Plan:  Controlled type 2 diabetes mellitus with diabetic nephropathy, without long-term current use of insulin ([place])  Essential hypertension  Patient has elected to resume Actos 30 mg a day and then recheck lab work in 3 months.  At that time I would repeat a TSH and a fasting lipid panel as well.  Blood pressure today is well controlled.  Gave the patient a prescription for Lotrisone cream twice daily for the intertrigo underneath her breast.  Recommended Diflucan 150 mg p.o. x1 as the patient also reports a yeast infection within her vagina that consist of itching and discharge.  Otherwise she is doing well with no concerns.

## 2020-07-28 LAB — HM DIABETES EYE EXAM

## 2020-08-02 ENCOUNTER — Ambulatory Visit: Payer: Medicare Other | Admitting: Family Medicine

## 2020-08-02 ENCOUNTER — Other Ambulatory Visit: Payer: Self-pay

## 2020-08-02 VITALS — BP 160/80 | HR 87 | Temp 97.8°F | Ht 63.0 in | Wt 229.0 lb

## 2020-08-02 DIAGNOSIS — H8110 Benign paroxysmal vertigo, unspecified ear: Secondary | ICD-10-CM

## 2020-08-02 DIAGNOSIS — Z23 Encounter for immunization: Secondary | ICD-10-CM

## 2020-08-02 MED ORDER — CETIRIZINE HCL 10 MG PO TABS: 10.0000 mg | ORAL_TABLET | Freq: Every day | ORAL | 11 refills | Status: DC

## 2020-08-02 MED ORDER — MECLIZINE HCL 25 MG PO TABS: 25.0000 mg | ORAL_TABLET | Freq: Three times a day (TID) | ORAL | 5 refills | Status: DC | PRN

## 2020-08-02 NOTE — Progress Notes (Signed)
Subjective:    Patient ID: Whitney Morales, female    DOB: 09/10/1941, 79 y.o.   MRN: [id number]  HPI  Patient is a very pleasant 79 year old Caucasian female who has been suffering with vertigo over the last few days.  She states that whenever she turns her head or sits up or lies down the room will start to spin.  It last for seconds or minutes and then resolve spontaneously.  In the past she is used meclizine to help with this and she would like a refill on that.  She also wonders if there is anything she can do to help treat this at home other than the pills. Past Medical History:  Diagnosis Date  . Abnormal finding on EKG 10/29/2012   FOLLOW UP NUCLEAR STRESS TEST ON 11/05/12 -NORMAL  . Corneal erosion 2012   RIGHT EYE--HAS RESOLVED--NO PROBLEMS SINCE  . Diabetes mellitus   . Diverticulitis   . Fatigue   . GERD (gastroesophageal reflux disease)    PAST HX--NO PROBLEMS NOW-PT DOES TAKE DAILY TUMS AT BEDTIME  . Goiter    cyst vs goiter on thyroid  . H/O hiatal hernia   . High cholesterol   . Hypertension   . Obesity   . Osteopenia    Past Surgical History:  Procedure Laterality Date  . ABDOMINAL HYSTERECTOMY    . BREAST LUMPECTOMY     BENIGN  . CARDIOVASCULAR STRESS TEST  06/08/2009   EF 78%, NO ISCHEMIA  . THYROID LOBECTOMY  11/27/2012   Procedure: THYROID LOBECTOMY;  Surgeon: Ralene Ok, MD;  Location: [facility];  Service: General;  Laterality: Right;  Right Thyroid Lobectomy  . TONSILLECTOMY    . US ECHOCARDIOGRAPHY  03/23/2006   EF 55-60%   Current Outpatient Medications on File Prior to Visit  Medication Sig Dispense Refill  . ACCU-CHEK AVIVA PLUS test strip USE 2 (TWO) TIMES DAILY. CHECK BLOOD SUGAR TWICE DAILY FASTING IN MORNING, 2 HRS AFTER A MEAL E11.65 100 strip 3  . Accu-Chek Softclix Lancets lancets USE AS DIRECTED TO CHECK SUGAR 100 each 0  . allopurinol (ZYLOPRIM) 100 MG tablet Take 2 tablets (200 mg total) by mouth daily. 180 tablet 1  . Blood Glucose  Monitoring Suppl (ACCU-CHEK AVIVA PLUS) W/DEVICE KIT PT NEEDS METER-STRIPS(1 BOTTLE =100/5 REFILLS)-LANCETS(1 BOX/5 REFILLS) CHECKS BS QD DX: 250.00 1 kit 0  . Calcium Carbonate Antacid (TUMS PO) Take by mouth at bedtime. RARELY WOULD NEED ADDITIONAL TUMS DURING DAY IF SHE ATE SPICY FOODS    . clotrimazole-betamethasone (LOTRISONE) cream Apply 1 application topically 2 (two) times daily. 30 g 3  . colchicine 0.6 MG tablet TAKE 1 TABLET DAILY. TAKE 2 TABS IMMEDIATELY THEN 1 IN AN HOUR IF STILL HURTING 90 tablet 1  . JANUVIA 100 MG tablet TAKE 1 TABLET BY MOUTH EVERY DAY 90 tablet 2  . levothyroxine (SYNTHROID) 75 MCG tablet TAKE 1 TABLET BY MOUTH ONCE DAILY BEFORE BREAKFAST 90 tablet 3  . lisinopril-hydrochlorothiazide (ZESTORETIC) 20-25 MG tablet TAKE ONE TABLET BY MOUTH DAILY 90 tablet 2  . metFORMIN (GLUCOPHAGE) 500 MG tablet TAKE TWO TABLETS BY MOUTH TWICE A DAY WITH A MEAL 360 tablet 2  . Multiple Vitamin (MULTIVITAMIN WITH MINERALS) TABS Take 1 tablet by mouth daily.    . pioglitazone (ACTOS) 30 MG tablet Take 1 tablet (30 mg total) by mouth daily. 90 tablet 3  . simvastatin (ZOCOR) 80 MG tablet TAKE 1/2 TABLET BY MOUTH EVERY NIGHT AT BEDTIME 45 tablet 2  No current facility-administered medications on file prior to visit.   Allergies  Allergen Reactions  . Clindamycin/Lincomycin     CLINDAMYCIN TAKEN WITH CIPRO CAUSED SEVERE NAUSEA--PT STATES SHE CAN TAKE CIPRO ALONE WITHOUT PROBLEM--IT WAS JUST THE COMBINATION SHE DID NOT TOLERATE.  Marland Kitchen Levaquin [Levofloxacin] Nausea And Vomiting  . Metronidazole Hcl Other (See Comments)    Deathly sick WHEN PT TOOK METRONIDAZOLE WITH CIPRO.  PT STATES SHE HAS TAKEN CIPRO ALONE WITHOUT PROBLEM --JUST DID NOT TOLERATE THE COMBINATION OF BOTH DRUGS TAKEN TOGETHER.   Social History   Socioeconomic History  . Marital status: Married    Spouse name: Not on file  . Number of children: Not on file  . Years of education: Not on file  . Highest education  level: Not on file  Occupational History  . Not on file  Tobacco Use  . Smoking status: Never Smoker  . Smokeless tobacco: Never Used  Substance and Sexual Activity  . Alcohol use: No  . Drug use: No  . Sexual activity: Not on file  Other Topics Concern  . Not on file  Social History Narrative  . Not on file   Social Determinants of Health   Financial Resource Strain:   . Difficulty of Paying Living Expenses: Not on file  Food Insecurity:   . Worried About Charity fundraiser in the Last Year: Not on file  . Ran Out of Food in the Last Year: Not on file  Transportation Needs:   . Lack of Transportation (Medical): Not on file  . Lack of Transportation (Non-Medical): Not on file  Physical Activity:   . Days of Exercise per Week: Not on file  . Minutes of Exercise per Session: Not on file  Stress:   . Feeling of Stress : Not on file  Social Connections:   . Frequency of Communication with Friends and Family: Not on file  . Frequency of Social Gatherings with Friends and Family: Not on file  . Attends Religious Services: Not on file  . Active Member of Clubs or Organizations: Not on file  . Attends Archivist Meetings: Not on file  . Marital Status: Not on file  Intimate Partner Violence:   . Fear of Current or Ex-Partner: Not on file  . Emotionally Abused: Not on file  . Physically Abused: Not on file  . Sexually Abused: Not on file     Review of Systems  Neurological: Positive for dizziness.  All other systems reviewed and are negative.      Objective:   Physical Exam Vitals reviewed.  Constitutional:      General: She is not in acute distress.    Appearance: She is well-developed. She is not diaphoretic.  HENT:     Right Ear: External ear normal.     Left Ear: External ear normal.     Nose: Mucosal edema present.     Mouth/Throat:     Pharynx: No oropharyngeal exudate.  Eyes:     Conjunctiva/sclera: Conjunctivae normal.  Cardiovascular:      Rate and Rhythm: Normal rate and regular rhythm.     Heart sounds: Normal heart sounds. No murmur heard.   Pulmonary:     Effort: Pulmonary effort is normal. No respiratory distress.     Breath sounds: Normal breath sounds. No wheezing or rales.  Abdominal:     General: Bowel sounds are normal. There is no distension.     Palpations: Abdomen is soft.  Tenderness: There is no abdominal tenderness. There is no rebound.  Lymphadenopathy:     Cervical: No cervical adenopathy.  Skin:    Findings: No rash.           Assessment & Plan:  Immunization due - Plan: CANCELED: Flu vaccine HIGH DOSE PF (Fluzone High dose)  Benign paroxysmal positional vertigo, unspecified laterality  Patient can use meclizine 25 mg every 6 hours as needed for vertigo.  Also gave her a handout on how to perform Epley maneuvers on her own at home to try to treat this so that she does not need the medication.  Blood pressure is elevated but she has whitecoat syndrome.  At home her blood pressures well controlled

## 2020-08-26 ENCOUNTER — Other Ambulatory Visit: Payer: Self-pay | Admitting: Family Medicine

## 2020-08-30 ENCOUNTER — Other Ambulatory Visit: Payer: Self-pay | Admitting: Family Medicine

## 2020-08-30 MED ORDER — LEVOTHYROXINE SODIUM 75 MCG PO TABS: ORAL_TABLET | ORAL | 3 refills | Status: DC

## 2020-08-30 NOTE — Telephone Encounter (Signed)
I have called the pt and informed her that there was a miss understanding and the medication as have sent into pharmacy. Pt was last seen in October and she is willing to do more lab work if it is needed. She will give [place] a call when she is ready for that to be done. Pt sated understanding and happy to hear medications have been sent to pharamcy.

## 2020-08-30 NOTE — Telephone Encounter (Signed)
Pt thyroid medication Euthyrox 75 mgwas denied wasn't sure why please advise pt why

## 2020-10-27 ENCOUNTER — Other Ambulatory Visit: Payer: Self-pay | Admitting: Family Medicine

## 2020-11-01 ENCOUNTER — Other Ambulatory Visit: Payer: Self-pay

## 2020-11-01 MED ORDER — METFORMIN HCL 500 MG PO TABS: ORAL_TABLET | ORAL | 2 refills | Status: DC

## 2020-11-03 ENCOUNTER — Other Ambulatory Visit: Payer: Self-pay | Admitting: Family Medicine

## 2020-12-14 ENCOUNTER — Other Ambulatory Visit: Payer: Medicare Other

## 2020-12-14 ENCOUNTER — Other Ambulatory Visit: Payer: Self-pay

## 2020-12-14 DIAGNOSIS — I10 Essential (primary) hypertension: Secondary | ICD-10-CM

## 2020-12-14 DIAGNOSIS — Z1159 Encounter for screening for other viral diseases: Secondary | ICD-10-CM

## 2020-12-14 DIAGNOSIS — E7800 Pure hypercholesterolemia, unspecified: Secondary | ICD-10-CM

## 2020-12-14 DIAGNOSIS — E038 Other specified hypothyroidism: Secondary | ICD-10-CM

## 2020-12-14 DIAGNOSIS — E1121 Type 2 diabetes mellitus with diabetic nephropathy: Secondary | ICD-10-CM

## 2020-12-14 DIAGNOSIS — Z8639 Personal history of other endocrine, nutritional and metabolic disease: Secondary | ICD-10-CM

## 2020-12-15 LAB — COMPLETE METABOLIC PANEL WITH GFR
AG Ratio: 1.5 (calc) (ref 1.0–2.5)
ALT: 12 U/L (ref 6–29)
AST: 13 U/L (ref 10–35)
Albumin: 4 g/dL (ref 3.6–5.1)
Alkaline phosphatase (APISO): 51 U/L (ref 37–153)
BUN/Creatinine Ratio: 19 (calc) (ref 6–22)
BUN: 19 mg/dL (ref 7–25)
CO2: 27 mmol/L (ref 20–32)
Calcium: 9.9 mg/dL (ref 8.6–10.4)
Chloride: 104 mmol/L (ref 98–110)
Creat: 1.01 mg/dL — ABNORMAL HIGH (ref 0.60–0.93)
GFR, Est African American: 62 mL/min/{1.73_m2} (ref >=60)
GFR, Est Non African American: 54 mL/min/{1.73_m2} — ABNORMAL LOW (ref >=60)
Globulin: 2.7 g/dL (calc) (ref 1.9–3.7)
Glucose, Bld: 116 mg/dL — ABNORMAL HIGH (ref 65–99)
Potassium: 4.9 mmol/L (ref 3.5–5.3)
Sodium: 142 mmol/L (ref 135–146)
Total Bilirubin: 0.4 mg/dL (ref 0.2–1.2)
Total Protein: 6.7 g/dL (ref 6.1–8.1)

## 2020-12-15 LAB — HEPATITIS C ANTIBODY
Hepatitis C Ab: NONREACTIVE
SIGNAL TO CUT-OFF: 0.02 (ref <1.00)

## 2020-12-15 LAB — MICROALBUMIN / CREATININE URINE RATIO
Creatinine, Urine: 80 mg/dL (ref 20–275)
Microalb Creat Ratio: 10 mcg/mg creat (ref <30)
Microalb, Ur: 0.8 mg/dL

## 2020-12-15 LAB — CBC WITH DIFFERENTIAL/PLATELET
Absolute Monocytes: 950 cells/uL (ref 200–950)
Basophils Absolute: 69 cells/uL (ref 0–200)
Basophils Relative: 0.7 %
Eosinophils Absolute: 139 cells/uL (ref 15–500)
Eosinophils Relative: 1.4 %
HCT: 35.5 % (ref 35.0–45.0)
Hemoglobin: 12 g/dL (ref 11.7–15.5)
Lymphs Abs: 1772 cells/uL (ref 850–3900)
MCH: 31.5 pg (ref 27.0–33.0)
MCHC: 33.8 g/dL (ref 32.0–36.0)
MCV: 93.2 fL (ref 80.0–100.0)
MPV: 10.3 fL (ref 7.5–12.5)
Monocytes Relative: 9.6 %
Neutro Abs: 6970 cells/uL (ref 1500–7800)
Neutrophils Relative %: 70.4 %
Platelets: 324 10*3/uL (ref 140–400)
RBC: 3.81 10*6/uL (ref 3.80–5.10)
RDW: 12.8 % (ref 11.0–15.0)
Total Lymphocyte: 17.9 %
WBC: 9.9 10*3/uL (ref 3.8–10.8)

## 2020-12-15 LAB — HEMOGLOBIN A1C
Hgb A1c MFr Bld: 6.6 % of total Hgb — ABNORMAL HIGH (ref <5.7)
Mean Plasma Glucose: 143 mg/dL
eAG (mmol/L): 7.9 mmol/L

## 2020-12-15 LAB — LIPID PANEL
Cholesterol: 136 mg/dL (ref <200)
HDL: 36 mg/dL — ABNORMAL LOW (ref >=50)
LDL Cholesterol (Calc): 74 mg/dL (calc)
Non-HDL Cholesterol (Calc): 100 mg/dL (calc) (ref <130)
Total CHOL/HDL Ratio: 3.8 (calc) (ref <5.0)
Triglycerides: 163 mg/dL — ABNORMAL HIGH (ref <150)

## 2020-12-15 LAB — TSH: TSH: 4.57 mIU/L — ABNORMAL HIGH (ref 0.40–4.50)

## 2020-12-15 LAB — VITAMIN D 25 HYDROXY (VIT D DEFICIENCY, FRACTURES): Vit D, 25-Hydroxy: 37 ng/mL (ref 30–100)

## 2020-12-20 ENCOUNTER — Ambulatory Visit: Payer: Medicare Other | Admitting: Family Medicine

## 2020-12-20 ENCOUNTER — Other Ambulatory Visit: Payer: Self-pay

## 2020-12-20 VITALS — BP 128/64 | HR 88 | Temp 97.4°F | Ht 63.0 in

## 2020-12-20 DIAGNOSIS — E7800 Pure hypercholesterolemia, unspecified: Secondary | ICD-10-CM | POA: Diagnosis not present

## 2020-12-20 DIAGNOSIS — E1121 Type 2 diabetes mellitus with diabetic nephropathy: Secondary | ICD-10-CM

## 2020-12-20 DIAGNOSIS — I10 Essential (primary) hypertension: Secondary | ICD-10-CM | POA: Diagnosis not present

## 2020-12-20 DIAGNOSIS — E038 Other specified hypothyroidism: Secondary | ICD-10-CM | POA: Diagnosis not present

## 2020-12-20 MED ORDER — ACCU-CHEK AVIVA PLUS VI STRP: ORAL_STRIP | 3 refills | Status: DC

## 2020-12-20 NOTE — Progress Notes (Signed)
Subjective:    Patient ID: Whitney Morales, female    DOB: 04-29-1941, 80 y.o.   MRN: [id number]  Patient is here today for a checkup.  Her blood pressures acceptable.  She denies any chest pain shortness of breath or dyspnea on exertion.  She denies any neuropathy in her feet.  She denies any polyuria, polydipsia, blurry vision.  She denies any myalgias or right upper quadrant pain.  She does report feeling some fatigue.  TSH is slightly elevated.  She is on 75 mcg a day levothyroxine.  We discussed potentially increasing to 88 mcg a day however she has just received a prescription for this and hates to waste the money.  Otherwise she is doing well with no concerns. Lab on 12/14/2020  Component Date Value Ref Range Status  . WBC 12/14/2020 9.9  3.8 - 10.8 Thousand/uL Final  . RBC 12/14/2020 3.81  3.80 - 5.10 Million/uL Final  . Hemoglobin 12/14/2020 12.0  11.7 - 15.5 g/dL Final  . HCT 12/14/2020 35.5  35.0 - 45.0 % Final  . MCV 12/14/2020 93.2  80.0 - 100.0 fL Final  . MCH 12/14/2020 31.5  27.0 - 33.0 pg Final  . MCHC 12/14/2020 33.8  32.0 - 36.0 g/dL Final  . RDW 12/14/2020 12.8  11.0 - 15.0 % Final  . Platelets 12/14/2020 324  140 - 400 Thousand/uL Final  . MPV 12/14/2020 10.3  7.5 - 12.5 fL Final  . Neutro Abs 12/14/2020 6,970  1,500 - 7,800 cells/uL Final  . Lymphs Abs 12/14/2020 1,772  850 - 3,900 cells/uL Final  . Absolute Monocytes 12/14/2020 950  200 - 950 cells/uL Final  . Eosinophils Absolute 12/14/2020 139  15 - 500 cells/uL Final  . Basophils Absolute 12/14/2020 69  0 - 200 cells/uL Final  . Neutrophils Relative % 12/14/2020 70.4  % Final  . Total Lymphocyte 12/14/2020 17.9  % Final  . Monocytes Relative 12/14/2020 9.6  % Final  . Eosinophils Relative 12/14/2020 1.4  % Final  . Basophils Relative 12/14/2020 0.7  % Final  . Glucose, Bld 12/14/2020 116* 65 - 99 mg/dL Final   Comment: .            Fasting reference interval . For someone without known diabetes, a glucose  value between 100 and 125 mg/dL is consistent with prediabetes and should be confirmed with a follow-up test. .   . BUN 12/14/2020 19  7 - 25 mg/dL Final  . Creat 12/14/2020 1.01* 0.60 - 0.93 mg/dL Final   Comment: For patients >83 years of age, the reference limit for Creatinine is approximately 13% higher for people identified as African-American. .   . GFR, Est Non African American 12/14/2020 54* > OR = 60 mL/min/1.77m2 Final  . GFR, Est African American 12/14/2020 62  > OR = 60 mL/min/1.100m2 Final  . BUN/Creatinine Ratio 12/14/2020 19  6 - 22 (calc) Final  . Sodium 12/14/2020 142  135 - 146 mmol/L Final  . Potassium 12/14/2020 4.9  3.5 - 5.3 mmol/L Final  . Chloride 12/14/2020 104  98 - 110 mmol/L Final  . CO2 12/14/2020 27  20 - 32 mmol/L Final  . Calcium 12/14/2020 9.9  8.6 - 10.4 mg/dL Final  . Total Protein 12/14/2020 6.7  6.1 - 8.1 g/dL Final  . Albumin 12/14/2020 4.0  3.6 - 5.1 g/dL Final  . Globulin 12/14/2020 2.7  1.9 - 3.7 g/dL (calc) Final  . AG Ratio 12/14/2020 1.5  1.0 -  2.5 (calc) Final  . Total Bilirubin 12/14/2020 0.4  0.2 - 1.2 mg/dL Final  . Alkaline phosphatase (APISO) 12/14/2020 51  37 - 153 U/L Final  . AST 12/14/2020 13  10 - 35 U/L Final  . ALT 12/14/2020 12  6 - 29 U/L Final  . Hepatitis C Ab 12/14/2020 NON-REACTIVE  NON-REACTI Final  . SIGNAL TO CUT-OFF 12/14/2020 0.02  <1.00 Final   Comment: . HCV antibody was non-reactive. There is no laboratory  evidence of HCV infection. . In most cases, no further action is required. However, if recent HCV exposure is suspected, a test for HCV RNA (test code [phone]) is suggested. . For additional information please refer to http://education.[facility].com/faq/FAQ22v1 (This link is being provided for informational/ educational purposes only.) .   Marland Kitchen Cholesterol 12/14/2020 136  <200 mg/dL Final  . HDL 12/14/2020 36* > OR = 50 mg/dL Final  . Triglycerides 12/14/2020 163* <150 mg/dL Final  . LDL  Cholesterol (Calc) 12/14/2020 74  mg/dL (calc) Final   Comment: Reference range: <100 . Desirable range <100 mg/dL for primary prevention;   <70 mg/dL for patients with CHD or diabetic patients  with > or = 2 CHD risk factors. Marland Kitchen LDL-C is now calculated using the Martin-Hopkins  calculation, which is a validated novel method providing  better accuracy than the Friedewald equation in the  estimation of LDL-C.  Cresenciano Genre et al. Annamaria Helling. 0626;948(54): 2061-2068  (http://education.[facility].com/faq/FAQ164)   . Total CHOL/HDL Ratio 12/14/2020 3.8  <5.0 (calc) Final  . Non-HDL Cholesterol (Calc) 12/14/2020 100  <130 mg/dL (calc) Final   Comment: For patients with diabetes plus 1 major ASCVD risk  factor, treating to a non-HDL-C goal of <100 mg/dL  (LDL-C of <70 mg/dL) is considered a therapeutic  option.   . TSH 12/14/2020 4.57* 0.40 - 4.50 mIU/L Final  . Vit D, 25-Hydroxy 12/14/2020 37  30 - 100 ng/mL Final   Comment: Vitamin D Status         25-OH Vitamin D: . Deficiency:                    <20 ng/mL Insufficiency:             20 - 29 ng/mL Optimal:                 > or = 30 ng/mL . For 25-OH Vitamin D testing on patients on  D2-supplementation and patients for whom quantitation  of D2 and D3 fractions is required, the [facility]ure[facility](TM) 25-OH VIT D, (D2,D3), LC/MS/MS is recommended: order  code [phone] (patients >84yrs). See Note 1 . Note 1 . For additional information, please refer to  http://education.[facility].com/faq/FAQ199  (This link is being provided for informational/ educational purposes only.)   . Hgb A1c MFr Bld 12/14/2020 6.6* <5.7 % of total Hgb Final   Comment: For someone without known diabetes, a hemoglobin A1c value of 6.5% or greater indicates that they may have  diabetes and this should be confirmed with a follow-up  test. . For someone with known diabetes, a value <7% indicates  that their diabetes is well controlled and a value  greater than  or equal to 7% indicates suboptimal  control. A1c targets should be individualized based on  duration of diabetes, age, comorbid conditions, and  other considerations. . Currently, no consensus exists regarding use of hemoglobin A1c for diagnosis of diabetes for children. .   . Mean Plasma Glucose 12/14/2020 143  mg/dL Final  .  eAG (mmol/L) 12/14/2020 7.9  mmol/L Final  . Creatinine, Urine 12/14/2020 80  20 - 275 mg/dL Final  . Microalb, Ur 12/14/2020 0.8  mg/dL Final   Comment: Reference Range Not established   . Microalb Creat Ratio 12/14/2020 10  <30 mcg/mg creat Final   Comment: . The ADA defines abnormalities in albumin excretion as follows: Marland Kitchen Albuminuria Category        Result (mcg/mg creatinine) . Normal to Mildly increased   <30 Moderately increased         30-299  Severely increased           > OR = 300 . The ADA recommends that at least two of three specimens collected within a 3-6 month period be abnormal before considering a patient to be within a diagnostic category.     Past Medical History:  Diagnosis Date  . Abnormal finding on EKG 10/29/2012   FOLLOW UP NUCLEAR STRESS TEST ON 11/05/12 -NORMAL  . Corneal erosion 2012   RIGHT EYE--HAS RESOLVED--NO PROBLEMS SINCE  . Diabetes mellitus   . Diverticulitis   . Fatigue   . GERD (gastroesophageal reflux disease)    PAST HX--NO PROBLEMS NOW-PT DOES TAKE DAILY TUMS AT BEDTIME  . Goiter    cyst vs goiter on thyroid  . H/O hiatal hernia   . High cholesterol   . Hypertension   . Obesity   . Osteopenia    Past Surgical History:  Procedure Laterality Date  . ABDOMINAL HYSTERECTOMY    . BREAST LUMPECTOMY     BENIGN  . CARDIOVASCULAR STRESS TEST  06/08/2009   EF 78%, NO ISCHEMIA  . THYROID LOBECTOMY  11/27/2012   Procedure: THYROID LOBECTOMY;  Surgeon: Ralene Ok, MD;  Location: [facility];  Service: General;  Laterality: Right;  Right Thyroid Lobectomy  . TONSILLECTOMY    . US ECHOCARDIOGRAPHY  03/23/2006   EF  55-60%   Current Outpatient Medications on File Prior to Visit  Medication Sig Dispense Refill  . Accu-Chek Softclix Lancets lancets USE AS DIRECTED TO CHECK SUGAR 100 each 0  . allopurinol (ZYLOPRIM) 100 MG tablet TAKE 2 TABLETS BY MOUTH EVERY DAY 180 tablet 1  . Blood Glucose Monitoring Suppl (ACCU-CHEK AVIVA PLUS) W/DEVICE KIT PT NEEDS METER-STRIPS(1 BOTTLE =100/5 REFILLS)-LANCETS(1 BOX/5 REFILLS) CHECKS BS QD DX: 250.00 1 kit 0  . Calcium Carbonate Antacid (TUMS PO) Take by mouth at bedtime. RARELY WOULD NEED ADDITIONAL TUMS DURING DAY IF SHE ATE SPICY FOODS    . cetirizine (ZYRTEC) 10 MG tablet Take 1 tablet (10 mg total) by mouth daily. 30 tablet 11  . clotrimazole-betamethasone (LOTRISONE) cream Apply 1 application topically 2 (two) times daily. 30 g 3  . colchicine 0.6 MG tablet TAKE 1 TABLET DAILY. TAKE 2 TABS IMMEDIATELY THEN 1 IN AN HOUR IF STILL HURTING 90 tablet 1  . JANUVIA 100 MG tablet TAKE 1 TABLET BY MOUTH EVERY DAY 90 tablet 2  . levothyroxine (SYNTHROID) 75 MCG tablet TAKE 1 TABLET BY MOUTH ONCE DAILY BEFORE BREAKFAST 90 tablet 3  . lisinopril-hydrochlorothiazide (ZESTORETIC) 20-25 MG tablet TAKE ONE TABLET BY MOUTH DAILY 90 tablet 2  . meclizine (ANTIVERT) 25 MG tablet Take 1 tablet (25 mg total) by mouth 3 (three) times daily as needed for dizziness. 30 tablet 5  . metFORMIN (GLUCOPHAGE) 500 MG tablet TAKE TWO TABLETS BY MOUTH TWICE A DAY WITH A MEAL 360 tablet 2  . Multiple Vitamin (MULTIVITAMIN WITH MINERALS) TABS Take 1 tablet by mouth daily.    Marland Kitchen  pioglitazone (ACTOS) 30 MG tablet Take 1 tablet (30 mg total) by mouth daily. 90 tablet 3  . simvastatin (ZOCOR) 80 MG tablet TAKE 1/2 TABLET BY MOUTH EVERY NIGHT AT BEDTIME 45 tablet 2   No current facility-administered medications on file prior to visit.   Allergies  Allergen Reactions  . Clindamycin/Lincomycin     CLINDAMYCIN TAKEN WITH CIPRO CAUSED SEVERE NAUSEA--PT STATES SHE CAN TAKE CIPRO ALONE WITHOUT PROBLEM--IT  WAS JUST THE COMBINATION SHE DID NOT TOLERATE.  Marland Kitchen Levaquin [Levofloxacin] Nausea And Vomiting  . Metronidazole Hcl Other (See Comments)    Deathly sick WHEN PT TOOK METRONIDAZOLE WITH CIPRO.  PT STATES SHE HAS TAKEN CIPRO ALONE WITHOUT PROBLEM --JUST DID NOT TOLERATE THE COMBINATION OF BOTH DRUGS TAKEN TOGETHER.   Social History   Socioeconomic History  . Marital status: Married    Spouse name: Not on file  . Number of children: Not on file  . Years of education: Not on file  . Highest education level: Not on file  Occupational History  . Not on file  Tobacco Use  . Smoking status: Never Smoker  . Smokeless tobacco: Never Used  Substance and Sexual Activity  . Alcohol use: No  . Drug use: No  . Sexual activity: Not on file  Other Topics Concern  . Not on file  Social History Narrative  . Not on file   Social Determinants of Health   Financial Resource Strain: Not on file  Food Insecurity: Not on file  Transportation Needs: Not on file  Physical Activity: Not on file  Stress: Not on file  Social Connections: Not on file  Intimate Partner Violence: Not on file   No family history on file.     Review of Systems  All other systems reviewed and are negative.      Objective:   Physical Exam Vitals reviewed.  Constitutional:      General: She is not in acute distress.    Appearance: She is well-developed. She is not diaphoretic.  HENT:     Right Ear: External ear normal.     Left Ear: External ear normal.     Nose: Nose normal.  Neck:     Thyroid: No thyromegaly.     Vascular: No JVD.  Cardiovascular:     Rate and Rhythm: Normal rate and regular rhythm.     Heart sounds: Normal heart sounds. No murmur heard.   Pulmonary:     Effort: Pulmonary effort is normal. No respiratory distress.     Breath sounds: Normal breath sounds. No wheezing or rales.  Abdominal:     General: Bowel sounds are normal. There is no distension.     Palpations: Abdomen is soft.  There is no mass.     Tenderness: There is no abdominal tenderness. There is no guarding or rebound.  Musculoskeletal:     Cervical back: Neck supple.  Skin:    Findings: No erythema or rash.  Neurological:     Mental Status: She is alert and oriented to person, place, and time.     Cranial Nerves: No cranial nerve deficit.     Motor: No abnormal muscle tone.     Coordination: Coordination normal.           Assessment & Plan:  Essential hypertension  Pure hypercholesterolemia  Controlled type 2 diabetes mellitus with diabetic nephropathy, without long-term current use of insulin (HCC)  Other specified hypothyroidism  Blood pressure is excellent.  LDL cholesterol is  well below 100.  HDL cholesterol is low and we did discuss aerobic exercise to address this.  A1c is acceptable.  We discussed increasing levothyroxine to 88 mcg a day but the patient defers this at the present time and prefers to recheck it in 6 months before making any changes.  Otherwise she is doing well with no concerns.

## 2021-01-18 ENCOUNTER — Encounter: Payer: Self-pay | Admitting: Emergency Medicine

## 2021-01-18 ENCOUNTER — Emergency Department: Payer: Medicare Other

## 2021-01-18 ENCOUNTER — Other Ambulatory Visit: Payer: Self-pay

## 2021-01-18 ENCOUNTER — Emergency Department
Admission: EM | Admit: 2021-01-18 | Discharge: 2021-01-18 | Disposition: A | Discharge status: Home / Self Care | Payer: Medicare Other | Attending: Emergency Medicine | Admitting: Emergency Medicine

## 2021-01-18 DIAGNOSIS — Z79899 Other long term (current) drug therapy: Secondary | ICD-10-CM | POA: Diagnosis not present

## 2021-01-18 DIAGNOSIS — E039 Hypothyroidism, unspecified: Secondary | ICD-10-CM | POA: Diagnosis not present

## 2021-01-18 DIAGNOSIS — M79601 Pain in right arm: Secondary | ICD-10-CM | POA: Insufficient documentation

## 2021-01-18 DIAGNOSIS — W101XXA Fall (on)(from) sidewalk curb, initial encounter: Secondary | ICD-10-CM | POA: Diagnosis not present

## 2021-01-18 DIAGNOSIS — Z7984 Long term (current) use of oral hypoglycemic drugs: Secondary | ICD-10-CM | POA: Diagnosis not present

## 2021-01-18 DIAGNOSIS — I10 Essential (primary) hypertension: Secondary | ICD-10-CM | POA: Insufficient documentation

## 2021-01-18 DIAGNOSIS — E119 Type 2 diabetes mellitus without complications: Secondary | ICD-10-CM | POA: Insufficient documentation

## 2021-01-18 MED ORDER — OXYCODONE-ACETAMINOPHEN 5-325 MG PO TABS: 1.0000 | ORAL_TABLET | Freq: Four times a day (QID) | ORAL | 0 refills | Status: AC | PRN

## 2021-01-18 MED ORDER — OXYCODONE-ACETAMINOPHEN 5-325 MG PO TABS
1.0000 | ORAL_TABLET | Freq: Once | ORAL | Status: AC
  Administered 2021-01-18: 1 via ORAL
  Filled 2021-01-18: qty 1

## 2021-01-18 MED ORDER — ONDANSETRON 4 MG PO TBDP
4.0000 mg | ORAL_TABLET | Freq: Once | ORAL | Status: AC
  Administered 2021-01-18: 4 mg via ORAL
  Filled 2021-01-18: qty 1

## 2021-01-18 MED ORDER — ONDANSETRON 4 MG PO TBDP: 4.0000 mg | ORAL_TABLET | Freq: Three times a day (TID) | ORAL | 0 refills | Status: AC | PRN

## 2021-01-18 NOTE — Discharge Instructions (Signed)
Please call Dr. Harlow Mares for follow-up appointment. You can take Percocet and Zofran for pain. Please start stool softener such as MiraLAX.  17 g once daily. Please take short, gentle walks daily. Please wear sling during the day except for sleeping and bathing.

## 2021-01-18 NOTE — ED Triage Notes (Signed)
Presents via EMS s/p fall  Having pain to right upper arm

## 2021-01-18 NOTE — ED Provider Notes (Signed)
[facility]-EMERGENCY DEPARTMENT  ____________________________________________  Time seen: Approximately 4:15 PM  I have reviewed the triage vital signs and the nursing notes.   HISTORY  Chief Complaint Fall   Historian Patient     HPI Whitney Morales is a 80 y.o. female presents to the emergency department after patient tripped on a curb while going to [employer].  Patient reports that she did not hit her head or neck and is not currently taking a blood thinner.  She denies numbness or tingling in the upper and lower extremities.  No chest pain, chest tightness or abdominal pain.  She is primarily endorsing 10 out of 10 acute and aching right mid arm pain.  Patient has no pain at the right wrist or the right elbow.  No alleviating measures have been attempted.  No lacerations or abrasions.   Past Medical History:  Diagnosis Date  . Abnormal finding on EKG 10/29/2012   FOLLOW UP NUCLEAR STRESS TEST ON 11/05/12 -NORMAL  . Corneal erosion 2012   RIGHT EYE--HAS RESOLVED--NO PROBLEMS SINCE  . Diabetes mellitus   . Diverticulitis   . Fatigue   . GERD (gastroesophageal reflux disease)    PAST HX--NO PROBLEMS NOW-PT DOES TAKE DAILY TUMS AT BEDTIME  . Goiter    cyst vs goiter on thyroid  . H/O hiatal hernia   . High cholesterol   . Hypertension   . Obesity   . Osteopenia      Immunizations up to date:  Yes.     Past Medical History:  Diagnosis Date  . Abnormal finding on EKG 10/29/2012   FOLLOW UP NUCLEAR STRESS TEST ON 11/05/12 -NORMAL  . Corneal erosion 2012   RIGHT EYE--HAS RESOLVED--NO PROBLEMS SINCE  . Diabetes mellitus   . Diverticulitis   . Fatigue   . GERD (gastroesophageal reflux disease)    PAST HX--NO PROBLEMS NOW-PT DOES TAKE DAILY TUMS AT BEDTIME  . Goiter    cyst vs goiter on thyroid  . H/O hiatal hernia   . High cholesterol   . Hypertension   . Obesity   . Osteopenia     Patient Active Problem List   Diagnosis Date Noted  . Hypothyroidism 08/10/2014   . Essential hypertension, benign 08/10/2014  . Hyperlipidemia 08/10/2014  . Diabetes mellitus without complication ([place]) 89/16/9450  . Abnormal ECG 11/01/2012    Past Surgical History:  Procedure Laterality Date  . ABDOMINAL HYSTERECTOMY    . BREAST LUMPECTOMY     BENIGN  . CARDIOVASCULAR STRESS TEST  06/08/2009   EF 78%, NO ISCHEMIA  . THYROID LOBECTOMY  11/27/2012   Procedure: THYROID LOBECTOMY;  Surgeon: Ralene Ok, MD;  Location: [facility];  Service: General;  Laterality: Right;  Right Thyroid Lobectomy  . TONSILLECTOMY    . US ECHOCARDIOGRAPHY  03/23/2006   EF 55-60%    Prior to Admission medications   Medication Sig Start Date End Date Taking? Authorizing Provider  ondansetron (ZOFRAN ODT) 4 MG disintegrating tablet Take 1 tablet (4 mg total) by mouth every 8 (eight) hours as needed for up to 5 days. 01/18/21 01/23/21 Yes Vallarie Mare M, PA-C  oxyCODONE-acetaminophen (PERCOCET/ROXICET) 5-325 MG tablet Take 1 tablet by mouth every 6 (six) hours as needed for up to 5 days. 01/18/21 01/23/21 Yes Vallarie Mare M, PA-C  Accu-Chek Softclix Lancets lancets USE AS DIRECTED TO CHECK SUGAR 07/03/19   Susy Frizzle, MD  allopurinol (ZYLOPRIM) 100 MG tablet TAKE 2 TABLETS BY MOUTH EVERY DAY 11/03/20   Pickard,  Cammie Mcgee, MD  Blood Glucose Monitoring Suppl (ACCU-CHEK AVIVA PLUS) W/DEVICE KIT PT NEEDS METER-STRIPS(1 BOTTLE =100/5 REFILLS)-LANCETS(1 BOX/5 REFILLS) CHECKS BS QD DX: 250.00 11/20/14   Susy Frizzle, MD  Calcium Carbonate Antacid (TUMS PO) Take by mouth at bedtime. RARELY WOULD NEED ADDITIONAL TUMS DURING DAY IF SHE ATE SPICY FOODS    [provider]  cetirizine (ZYRTEC) 10 MG tablet Take 1 tablet (10 mg total) by mouth daily. 08/02/20   Susy Frizzle, MD  clotrimazole-betamethasone (LOTRISONE) cream Apply 1 application topically 2 (two) times daily. 05/27/20   Susy Frizzle, MD  colchicine 0.6 MG tablet TAKE 1 TABLET DAILY. TAKE 2 TABS IMMEDIATELY THEN 1 IN AN HOUR IF  STILL HURTING 10/27/20   Susy Frizzle, MD  glucose blood (ACCU-CHEK AVIVA PLUS) test strip USE 2 (TWO) TIMES DAILY. CHECK BLOOD SUGAR TWICE DAILY FASTING IN MORNING, 2 HRS AFTER A MEAL E11.65 12/20/20   Susy Frizzle, MD  JANUVIA 100 MG tablet TAKE 1 TABLET BY MOUTH EVERY DAY 04/20/20   Susy Frizzle, MD  levothyroxine (SYNTHROID) 75 MCG tablet TAKE 1 TABLET BY MOUTH ONCE DAILY BEFORE BREAKFAST 08/30/20   Susy Frizzle, MD  lisinopril-hydrochlorothiazide (ZESTORETIC) 20-25 MG tablet TAKE ONE TABLET BY MOUTH DAILY 04/20/20   Susy Frizzle, MD  meclizine (ANTIVERT) 25 MG tablet Take 1 tablet (25 mg total) by mouth 3 (three) times daily as needed for dizziness. 08/02/20   Susy Frizzle, MD  metFORMIN (GLUCOPHAGE) 500 MG tablet TAKE TWO TABLETS BY MOUTH TWICE A DAY WITH A MEAL 11/01/20   Susy Frizzle, MD  Multiple Vitamin (MULTIVITAMIN WITH MINERALS) TABS Take 1 tablet by mouth daily.    [provider]  pioglitazone (ACTOS) 30 MG tablet Take 1 tablet (30 mg total) by mouth daily. 05/27/20   Susy Frizzle, MD  simvastatin (ZOCOR) 80 MG tablet TAKE 1/2 TABLET BY MOUTH EVERY NIGHT AT BEDTIME 01/26/20   Susy Frizzle, MD    Allergies Clindamycin/lincomycin, Levaquin [levofloxacin], and Metronidazole hcl  No family history on file.  Social History Social History   Tobacco Use  . Smoking status: Never Smoker  . Smokeless tobacco: Never Used  Substance Use Topics  . Alcohol use: No  . Drug use: No     Review of Systems  Constitutional: No fever/chills Eyes:  No discharge ENT: No upper respiratory complaints. Respiratory: no cough. No SOB/ use of accessory muscles to breath Gastrointestinal:   No nausea, no vomiting.  No diarrhea.  No constipation. Musculoskeletal: Patient has right upper arm pain.  Skin: Negative for rash, abrasions, lacerations, ecchymosis.    ____________________________________________   PHYSICAL EXAM:  VITAL SIGNS: ED Triage  Vitals  Enc Vitals Group     BP 01/18/21 1415 (!) 183/77     Pulse Rate 01/18/21 1415 90     Resp 01/18/21 1415 18     Temp 01/18/21 1415 97.9 F (36.6 C)     Temp Source 01/18/21 1415 Oral     SpO2 01/18/21 1415 98 %     Weight 01/18/21 1412 225 lb (102.1 kg)     Height 01/18/21 1412 5' 3" (1.6 m)     Head Circumference --      Peak Flow --      Pain Score 01/18/21 1411 6     Pain Loc --      Pain Edu? --      Excl. in [place]? --  Constitutional: Alert and oriented. Well appearing and in no acute distress. Eyes: Conjunctivae are normal. PERRL. EOMI. Head: Atraumatic. ENT:      Nose: No congestion/rhinnorhea.      Mouth/Throat: Mucous membranes are moist.  Neck: No stridor. FROM.   Cardiovascular: Normal rate, regular rhythm. Normal S1 and S2.  Good peripheral circulation. Respiratory: Normal respiratory effort without tachypnea or retractions. Lungs CTAB. Good air entry to the bases with no decreased or absent breath sounds Gastrointestinal: Bowel sounds x 4 quadrants. Soft and nontender to palpation. No guarding or rigidity. No distention. Musculoskeletal: Patient is able to move all 5 right fingers.  Patient can perform full range of motion of the right wrist.  She can spread her right fingers and can perform opposition easily.  She can pronate and supinate at the right forearm.  Palpable radial normal pulses bilaterally and symmetrically.  Capillary refill is less than 2 seconds on the right. Neurologic:  Normal for age. No gross focal neurologic deficits are appreciated.  Skin:  Skin is warm, dry and intact. No rash noted. Psychiatric: Mood and affect are normal for age. Speech and behavior are normal.   ____________________________________________   LABS (all labs ordered are listed, but only abnormal results are displayed)  Labs Reviewed - No data to  display ____________________________________________  EKG   ____________________________________________  RADIOLOGY Unk Pinto, personally viewed and evaluated these images (plain radiographs) as part of my medical decision making, as well as reviewing the written report by the radiologist.  DG Humerus Right  Result Date: 01/18/2021 CLINICAL DATA:  Status post fall, right humeral pain EXAM: RIGHT HUMERUS - 2+ VIEW COMPARISON:  None. FINDINGS: Spiral fracture of the proximal humeral diaphysis with 13 mm of anteromedial displacement. No other fracture or dislocation. No aggressive osseous lesion. IMPRESSION: 1. Acute, displaced spiral fracture of the proximal humeral diaphysis. Electronically Signed   By: Kathreen Devoid   On: 01/18/2021 15:13    ____________________________________________    PROCEDURES  Procedure(s) performed:     Procedures     Medications  oxyCODONE-acetaminophen (PERCOCET/ROXICET) 5-325 MG per tablet 1 tablet (1 tablet Oral Given 01/18/21 1550)  ondansetron (ZOFRAN-ODT) disintegrating tablet 4 mg (4 mg Oral Given 01/18/21 1550)     ____________________________________________   INITIAL IMPRESSION / ASSESSMENT AND PLAN / ED COURSE  Pertinent labs & imaging results that were available during my care of the patient were reviewed by me and considered in my medical decision making (see chart for details).      Assessment and plan Right arm pain 80 year old female presents to the emergency department after mechanical fall.  Patient was hypertensive at triage but vital signs were otherwise reassuring.  On x-ray, patient had a spiral fracture of the right humerus.  Patient was placed in a sling for comfort and Percocet was given for pain.  She was advised to follow-up with orthopedics, Dr. Harlow Mares.  Return precautions were given to return with new or worsening symptoms.     ____________________________________________  FINAL CLINICAL  IMPRESSION(S) / ED DIAGNOSES  Final diagnoses:  Pain of right upper extremity      NEW MEDICATIONS STARTED DURING THIS VISIT:  ED Discharge Orders         Ordered    oxyCODONE-acetaminophen (PERCOCET/ROXICET) 5-325 MG tablet  Every 6 hours PRN        01/18/21 1548    ondansetron (ZOFRAN ODT) 4 MG disintegrating tablet  Every 8 hours PRN  01/18/21 1548              This chart was dictated using voice recognition software/Dragon. Despite best efforts to proofread, errors can occur which can change the meaning. Any change was purely unintentional.     Lannie Fields, PA-C 01/18/21 1617    Vladimir Crofts, MD 01/23/21 [phone]

## 2021-01-19 ENCOUNTER — Telehealth: Payer: Self-pay

## 2021-01-19 NOTE — Telephone Encounter (Signed)
Transition Care Management Follow-up Telephone Call  Date of discharge and from where: 01/18/2021 from [facility]  How have you been since you were released from the hospital? Pt states that she is sore but she is feeling well.   Any questions or concerns? No  Items Reviewed:  Did the pt receive and understand the discharge instructions provided? Yes   Medications obtained and verified? Yes   Other? No   Any new allergies since your discharge? No   Dietary orders reviewed? n/a  Do you have support at home? Yes   Functional Questionnaire: (I = Independent and D = Dependent) ADLs: I  Bathing/Dressing- I  Meal Prep- I  Eating- I  Maintaining continence- I  Transferring/Ambulation- I  Managing Meds- I  Follow up appointments reviewed:    [facility] f/u appt confirmed? Yes  Scheduled to see Ortho today at 10am. Appt is not seen in chart.   Are transportation arrangements needed? No   If their condition worsens, is the pt aware to call PCP or go to the Emergency Dept.? Yes  Was the patient provided with contact information for the PCP's office or ED? Yes  Was to pt encouraged to call back with questions or concerns? Yes

## 2021-01-21 ENCOUNTER — Other Ambulatory Visit: Payer: Self-pay | Admitting: Family Medicine

## 2021-01-23 ENCOUNTER — Other Ambulatory Visit: Payer: Self-pay

## 2021-01-23 DIAGNOSIS — Z79899 Other long term (current) drug therapy: Secondary | ICD-10-CM | POA: Diagnosis not present

## 2021-01-23 DIAGNOSIS — I10 Essential (primary) hypertension: Secondary | ICD-10-CM | POA: Insufficient documentation

## 2021-01-23 DIAGNOSIS — R600 Localized edema: Secondary | ICD-10-CM | POA: Diagnosis not present

## 2021-01-23 DIAGNOSIS — S42201D Unspecified fracture of upper end of right humerus, subsequent encounter for fracture with routine healing: Secondary | ICD-10-CM | POA: Diagnosis not present

## 2021-01-23 DIAGNOSIS — S4992XD Unspecified injury of left shoulder and upper arm, subsequent encounter: Secondary | ICD-10-CM | POA: Diagnosis present

## 2021-01-23 DIAGNOSIS — E039 Hypothyroidism, unspecified: Secondary | ICD-10-CM | POA: Insufficient documentation

## 2021-01-23 DIAGNOSIS — W19XXXA Unspecified fall, initial encounter: Secondary | ICD-10-CM | POA: Insufficient documentation

## 2021-01-23 DIAGNOSIS — Z7984 Long term (current) use of oral hypoglycemic drugs: Secondary | ICD-10-CM | POA: Diagnosis not present

## 2021-01-23 DIAGNOSIS — E1169 Type 2 diabetes mellitus with other specified complication: Secondary | ICD-10-CM | POA: Insufficient documentation

## 2021-01-23 DIAGNOSIS — E7800 Pure hypercholesterolemia, unspecified: Secondary | ICD-10-CM | POA: Insufficient documentation

## 2021-01-23 NOTE — ED Triage Notes (Signed)
Pt states she fell on Tuesday and broke upper left arm for which she was seen. Pt states it was painful until Friday when they placed a brace at orthopedic office, after which the swelling increased significantly. Pt states she will not see orthopedic MD until Monday. Left arm/hand is notably swollen and bruised. Arm is in sling and prefabricated brace. CMS is intact, capillary refill is <3.

## 2021-01-24 ENCOUNTER — Emergency Department
Admission: EM | Admit: 2021-01-24 | Discharge: 2021-01-24 | Disposition: A | Discharge status: Home / Self Care | Payer: Medicare Other | Attending: Emergency Medicine | Admitting: Emergency Medicine

## 2021-01-24 DIAGNOSIS — R600 Localized edema: Secondary | ICD-10-CM

## 2021-01-24 DIAGNOSIS — S42201D Unspecified fracture of upper end of right humerus, subsequent encounter for fracture with routine healing: Secondary | ICD-10-CM

## 2021-01-24 LAB — CBG MONITORING, ED: Glucose-Capillary: 151 mg/dL — ABNORMAL HIGH (ref 70–99)

## 2021-01-24 NOTE — ED Provider Notes (Signed)
[facility] Emergency Department Provider Note  ____________________________________________   Event Date/Time   First MD Initiated Contact with Patient 01/24/21 [phone]     (approximate)  I have reviewed the triage vital signs and the nursing notes.   HISTORY  Chief Complaint Arm Injury    HPI Whitney Morales is a 80 y.o. female with medical history as listed below who notably was found to have a right proximal humerus fracture last week and placed in a sling and subsequently followed up in the orthopedics clinic (Dr. Harlow Mares).  She was placed in a type of brace that she said has been extremely uncomfortable and she feels like it may be cutting off some of the circulation underneath her arm.  She had no significant swelling of her right arm until after the brace was placed and since then her right arm has swollen a lot including all the way down into her hand.  She has no significant amount of pain other than at the site of the fracture.  She has no numbness, tingling, sensation of cold, nor other concerning finding except for the swelling itself.  Her arm is still soft and she has full motion of all her fingers in spite of the swelling.  She has an appointment with Dr. Harlow Mares in about 7 hours but she cannot stand the brace any longer.  She has had no additional falls.  She has no other symptoms or complaints at this time.  The symptoms gradually got worse over the last few days since having the brace placed.        Past Medical History:  Diagnosis Date  . Abnormal finding on EKG 10/29/2012   FOLLOW UP NUCLEAR STRESS TEST ON 11/05/12 -NORMAL  . Corneal erosion 2012   RIGHT EYE--HAS RESOLVED--NO PROBLEMS SINCE  . Diabetes mellitus   . Diverticulitis   . Fatigue   . GERD (gastroesophageal reflux disease)    PAST HX--NO PROBLEMS NOW-PT DOES TAKE DAILY TUMS AT BEDTIME  . Goiter    cyst vs goiter on thyroid  . H/O hiatal hernia   . High cholesterol   .  Hypertension   . Obesity   . Osteopenia     Patient Active Problem List   Diagnosis Date Noted  . Hypothyroidism 08/10/2014  . Essential hypertension, benign 08/10/2014  . Hyperlipidemia 08/10/2014  . Diabetes mellitus without complication ([place]) 83/41/9622  . Abnormal ECG 11/01/2012    Past Surgical History:  Procedure Laterality Date  . ABDOMINAL HYSTERECTOMY    . BREAST LUMPECTOMY     BENIGN  . CARDIOVASCULAR STRESS TEST  06/08/2009   EF 78%, NO ISCHEMIA  . THYROID LOBECTOMY  11/27/2012   Procedure: THYROID LOBECTOMY;  Surgeon: Ralene Ok, MD;  Location: [facility];  Service: General;  Laterality: Right;  Right Thyroid Lobectomy  . TONSILLECTOMY    . US ECHOCARDIOGRAPHY  03/23/2006   EF 55-60%    Prior to Admission medications   Medication Sig Start Date End Date Taking? Authorizing Provider  Accu-Chek Softclix Lancets lancets USE AS DIRECTED TO CHECK SUGAR 07/03/19   Susy Frizzle, MD  allopurinol (ZYLOPRIM) 100 MG tablet TAKE 2 TABLETS BY MOUTH EVERY DAY 11/03/20   Susy Frizzle, MD  Blood Glucose Monitoring Suppl (ACCU-CHEK AVIVA PLUS) W/DEVICE KIT PT NEEDS METER-STRIPS(1 BOTTLE =100/5 REFILLS)-LANCETS(1 BOX/5 REFILLS) CHECKS BS QD DX: 250.00 11/20/14   Susy Frizzle, MD  Calcium Carbonate Antacid (TUMS PO) Take by mouth at bedtime. RARELY WOULD  NEED ADDITIONAL TUMS DURING DAY IF SHE ATE SPICY FOODS    [provider]  cetirizine (ZYRTEC) 10 MG tablet Take 1 tablet (10 mg total) by mouth daily. 08/02/20   Susy Frizzle, MD  clotrimazole-betamethasone (LOTRISONE) cream Apply 1 application topically 2 (two) times daily. 05/27/20   Susy Frizzle, MD  colchicine 0.6 MG tablet TAKE 1 TABLET DAILY. TAKE 2 TABS IMMEDIATELY THEN 1 IN AN HOUR IF STILL HURTING 01/21/21   Susy Frizzle, MD  glucose blood (ACCU-CHEK AVIVA PLUS) test strip USE 2 (TWO) TIMES DAILY. CHECK BLOOD SUGAR TWICE DAILY FASTING IN MORNING, 2 HRS AFTER A MEAL E11.65 12/20/20   Susy Frizzle, MD  JANUVIA 100 MG tablet TAKE 1 TABLET BY MOUTH EVERY DAY 04/20/20   Susy Frizzle, MD  levothyroxine (SYNTHROID) 75 MCG tablet TAKE 1 TABLET BY MOUTH ONCE DAILY BEFORE BREAKFAST 08/30/20   Susy Frizzle, MD  lisinopril-hydrochlorothiazide (ZESTORETIC) 20-25 MG tablet TAKE ONE TABLET BY MOUTH DAILY 04/20/20   Susy Frizzle, MD  meclizine (ANTIVERT) 25 MG tablet Take 1 tablet (25 mg total) by mouth 3 (three) times daily as needed for dizziness. 08/02/20   Susy Frizzle, MD  metFORMIN (GLUCOPHAGE) 500 MG tablet TAKE TWO TABLETS BY MOUTH TWICE A DAY WITH A MEAL 11/01/20   Susy Frizzle, MD  Multiple Vitamin (MULTIVITAMIN WITH MINERALS) TABS Take 1 tablet by mouth daily.    [provider]  pioglitazone (ACTOS) 30 MG tablet Take 1 tablet (30 mg total) by mouth daily. 05/27/20   Susy Frizzle, MD  simvastatin (ZOCOR) 80 MG tablet TAKE 1/2 TABLET BY MOUTH EVERY NIGHT AT BEDTIME 01/26/20   Susy Frizzle, MD    Allergies Clindamycin/lincomycin, Levaquin [levofloxacin], and Metronidazole hcl  History reviewed. No pertinent family history.  Social History Social History   Tobacco Use  . Smoking status: Never Smoker  . Smokeless tobacco: Never Used  Substance Use Topics  . Alcohol use: No  . Drug use: No    Review of Systems Constitutional: No fever/chills Eyes: No visual changes. ENT: No sore throat. Cardiovascular: Denies chest pain. Respiratory: Denies shortness of breath. Gastrointestinal: No abdominal pain.  No nausea, no vomiting.  No diarrhea.  No constipation. Genitourinary: Negative for dysuria. Musculoskeletal: Right arm pain and swelling. Integumentary: Negative for rash. Neurological: Negative for headaches, focal weakness or numbness.   ____________________________________________   PHYSICAL EXAM:  VITAL SIGNS: ED Triage Vitals  Enc Vitals Group     BP 01/23/21 2144 (!) 164/68     Pulse Rate 01/23/21 2144 (!) 101     Resp 01/23/21  2144 18     Temp 01/23/21 2144 98 F (36.7 C)     Temp Source 01/23/21 2144 Oral     SpO2 01/23/21 2144 98 %     Weight 01/23/21 2208 104.3 kg (230 lb)     Height 01/23/21 2208 1.6 m ('5\' 3"' )     Head Circumference --      Peak Flow --      Pain Score 01/23/21 2208 0     Pain Loc --      Pain Edu? --      Excl. in [place]? --     Constitutional: Alert and oriented.  Eyes: Conjunctivae are normal.  Head: Atraumatic. Nose: No congestion/rhinnorhea. Mouth/Throat: Patient is wearing a mask. Neck: No stridor.  No meningeal signs.   Cardiovascular: Normal rate, regular rhythm. Good peripheral circulation. Respiratory: Normal  respiratory effort.  No retractions. Gastrointestinal: Soft and nontender. No distention.  Musculoskeletal: Orthopedic brace is in place on the right shoulder which is then supported by a sling.  The exposed part of her forearm and hand are notable for substantial 2-3+ pitting edema.  There is also a subacute ecchymosis at various stages of healing.  However the patient is neurovascularly intact with a strong and easily palpable radial pulse, normal sensation, normal temperature (warm and well-perfused), and with minimal pain.  The compartments are soft and easily compressible. Neurologic:  Normal speech and language. No gross focal neurologic deficits are appreciated.  Skin:  Skin is warm, dry and intact. Psychiatric: Mood and affect are normal. Speech and behavior are normal.  ____________________________________________   LABS (all labs ordered are listed, but only abnormal results are displayed)  Labs Reviewed  CBG MONITORING, ED - Abnormal; Notable for the following components:      Result Value   Glucose-Capillary 151 (*)    All other components within normal limits   ____________________________________________  EKG  None - EKG not ordered by ED physician ____________________________________________    INITIAL IMPRESSION / MDM / [place] /  ED COURSE  As part of my medical decision making, I reviewed the following data within the electronic MEDICAL RECORD NUMBER History obtained from family, Nursing notes reviewed and incorporated, Old chart reviewed and Notes from prior ED visits   Differential diagnosis includes, but is not limited to, uncomfortable brace placement, sequela of humeral fracture, compartment syndrome, neurovascular compromise.  Patient is well-appearing and in no significant distress.  I believe that the brace is likely ill fitted to her body and has led to some decreased venous return leading to the edema.  Fortunately there are no signs or symptoms of compartment syndrome and no additional imaging is required at this time.  At the patient's request I remove the brace since initial treatment of a humeral fracture is immobilization with the spring anyway.  She has an appointment with Dr. Harlow Mares within 7 hours or so and she and her husband are very comfortable following up in clinic to discuss options other than the brace.  She understands and agrees with the plan for close follow-up and I gave my usual and customary return precautions.         ____________________________________________  FINAL CLINICAL IMPRESSION(S) / ED DIAGNOSES  Final diagnoses:  Closed fracture of proximal end of right humerus with routine healing, unspecified fracture morphology, subsequent encounter  Edema of right upper arm     MEDICATIONS GIVEN DURING THIS VISIT:  Medications - No data to display   ED Discharge Orders    None      *Please note:  Whitney Morales was evaluated in Emergency Department on 01/24/2021 for the symptoms described in the history of present illness. She was evaluated in the context of the global COVID-19 pandemic, which necessitated consideration that the patient might be at risk for infection with the SARS-CoV-2 virus that causes COVID-19. Institutional protocols and algorithms that pertain to the evaluation  of patients at risk for COVID-19 are in a state of rapid change based on information released by regulatory bodies including the CDC and federal and state organizations. These policies and algorithms were followed during the patient's care in the ED.  Some ED evaluations and interventions may be delayed as a result of limited staffing during and after the pandemic.*  Note:  This document was prepared using Systems analyst and  may include unintentional dictation errors.   Hinda Kehr, MD 01/24/21 [phone]

## 2021-01-24 NOTE — Discharge Instructions (Signed)
As we discussed, you do not have compartment syndrome or another emergent complication of your fracture.  However given how uncomfortable the brace was that you were put in last week, we removed it and encourage you to continue using the sling until you follow-up in a few hours with Dr. Harlow Mares to discuss other options.  Return to the emergency department if you develop new or worsening symptoms that concern you.

## 2021-01-25 ENCOUNTER — Telehealth: Payer: Self-pay | Admitting: *Deleted

## 2021-01-25 NOTE — Telephone Encounter (Signed)
Transition Care Management Unsuccessful Follow-up Telephone Call  Date of discharge and from where:  01/24/2021 [facility] ED  Attempts:  1st Attempt  Reason for unsuccessful TCM follow-up call:  Left voice message

## 2021-01-26 NOTE — Telephone Encounter (Signed)
Transition Care Management Follow-up Telephone Call  Date of discharge and from where: 01/24/2021 [facility] ED  How have you been since you were released from the hospital? "I am okay"  Any questions or concerns? No  Items Reviewed:  Did the pt receive and understand the discharge instructions provided? Yes   Medications obtained and verified? Yes   Other? No   Any new allergies since your discharge? No   Dietary orders reviewed? No  Do you have support at home? Yes     Functional Questionnaire: (I = Independent and D = Dependent) ADLs: I  Bathing/Dressing- I  Meal Prep- I  Eating- I  Maintaining continence- I  Transferring/Ambulation- I  Managing Meds- I  Follow up appointments reviewed:   PCP Hospital f/u appt confirmed? No    Specialist Hospital f/u appt confirmed? No    Are transportation arrangements needed? No   If their condition worsens, is the pt aware to call PCP or go to the Emergency Dept.? Yes  Was the patient provided with contact information for the PCP's office or ED? Yes  Was to pt encouraged to call back with questions or concerns? Yes

## 2021-01-28 ENCOUNTER — Telehealth: Payer: Self-pay | Admitting: Family Medicine

## 2021-01-28 MED ORDER — LISINOPRIL-HYDROCHLOROTHIAZIDE 20-25 MG PO TABS: 1.0000 | ORAL_TABLET | Freq: Every day | ORAL | 2 refills | Status: DC

## 2021-01-28 NOTE — Telephone Encounter (Signed)
Husband requesting a refill on lisinopril-hydrochlorothiazide (ZESTORETIC) 20-25 MG tablet  He states that she uses [facility] and he isn't sure she will have enough for the weekend. He also said that they had already faxed [place] a request.  CB# [phone]

## 2021-01-28 NOTE — Telephone Encounter (Signed)
Prescription sent to pharmacy.

## 2021-02-09 ENCOUNTER — Telehealth: Payer: Self-pay | Admitting: Family Medicine

## 2021-02-09 NOTE — Telephone Encounter (Signed)
lisinopril-hydrochlorothiazide (ZESTORETIC) 20-25 MG tablet  Fax request for refill for [employer]

## 2021-02-11 ENCOUNTER — Telehealth: Payer: Self-pay

## 2021-02-11 MED ORDER — LISINOPRIL-HYDROCHLOROTHIAZIDE 20-25 MG PO TABS: 1.0000 | ORAL_TABLET | Freq: Every day | ORAL | 2 refills | Status: DC

## 2021-02-11 NOTE — Telephone Encounter (Signed)
NTBS to rule out blood clot in the arm.

## 2021-02-11 NOTE — Telephone Encounter (Signed)
Patient called wanting medication for swelling in right are due to fall. Pt has been scheduled for 02/14/21. Pt verbalized understanding. Told to proceed to urgent care or ER if need over weekend.

## 2021-02-14 ENCOUNTER — Other Ambulatory Visit: Payer: Self-pay | Admitting: Family Medicine

## 2021-02-14 ENCOUNTER — Other Ambulatory Visit: Payer: Self-pay

## 2021-02-14 ENCOUNTER — Encounter: Payer: Self-pay | Admitting: Family Medicine

## 2021-02-14 ENCOUNTER — Ambulatory Visit (HOSPITAL_COMMUNITY)
Admission: RE | Admit: 2021-02-14 | Discharge: 2021-02-14 | Disposition: A | Discharge status: Home / Self Care | Payer: Medicare Other | Source: Ambulatory Visit | Attending: Family Medicine | Admitting: Family Medicine

## 2021-02-14 ENCOUNTER — Ambulatory Visit: Payer: Medicare Other | Admitting: Family Medicine

## 2021-02-14 VITALS — BP 136/84 | HR 92 | Temp 98.1°F | Resp 14 | Ht 63.0 in | Wt 229.0 lb

## 2021-02-14 DIAGNOSIS — M25421 Effusion, right elbow: Secondary | ICD-10-CM

## 2021-02-14 MED ORDER — FUROSEMIDE 40 MG PO TABS: 40.0000 mg | ORAL_TABLET | Freq: Every day | ORAL | 0 refills | Status: DC | PRN

## 2021-02-14 NOTE — Addendum Note (Signed)
Addended by: Sheral Flow on: 02/14/2021 12:18 PM   Modules accepted: Orders

## 2021-02-14 NOTE — Addendum Note (Signed)
Addended by: Sheral Flow on: 02/14/2021 12:17 PM   Modules accepted: Orders

## 2021-02-14 NOTE — Progress Notes (Signed)
Subjective:    Patient ID: Whitney Morales, female    DOB: [birth year], 80 y.o.   MRN: [id number]  Patient sustained a spiral fracture to the humerus about 4 weeks ago.  She has been wearing a sling for the most part the entire time under the care of an orthopedist.  She has developed substantial edema in the right hand and the right wrist.  The edema terminates at the right forearm.  There is significant bruising along with the edema in the right arm.  She denies any chest pain or shortness of breath.  She is requesting a fluid pill to help with the swelling.  The differential diagnosis includes dependent edema due to the positioning, the fracture, and the sling versus a DVT in the right upper extremity.  Patient admits that she has not been using her arm very much due to the fracture and therefore it could very well be dependent edema.  Past Medical History:  Diagnosis Date  . Abnormal finding on EKG 10/29/2012   FOLLOW UP NUCLEAR STRESS TEST ON 11/05/12 -NORMAL  . Corneal erosion 2012   RIGHT EYE--HAS RESOLVED--NO PROBLEMS SINCE  . Diabetes mellitus   . Diverticulitis   . Fatigue   . GERD (gastroesophageal reflux disease)    PAST HX--NO PROBLEMS NOW-PT DOES TAKE DAILY TUMS AT BEDTIME  . Goiter    cyst vs goiter on thyroid  . H/O hiatal hernia   . High cholesterol   . Hypertension   . Obesity   . Osteopenia    Past Surgical History:  Procedure Laterality Date  . ABDOMINAL HYSTERECTOMY    . BREAST LUMPECTOMY     BENIGN  . CARDIOVASCULAR STRESS TEST  06/08/2009   EF 78%, NO ISCHEMIA  . THYROID LOBECTOMY  11/27/2012   Procedure: THYROID LOBECTOMY;  Surgeon: Ralene Ok, MD;  Location: [facility];  Service: General;  Laterality: Right;  Right Thyroid Lobectomy  . TONSILLECTOMY    . US ECHOCARDIOGRAPHY  03/23/2006   EF 55-60%   Current Outpatient Medications on File Prior to Visit  Medication Sig Dispense Refill  . Accu-Chek Softclix Lancets lancets USE AS DIRECTED TO CHECK SUGAR 100  each 0  . allopurinol (ZYLOPRIM) 100 MG tablet TAKE 2 TABLETS BY MOUTH EVERY DAY 180 tablet 1  . Blood Glucose Monitoring Suppl (ACCU-CHEK AVIVA PLUS) W/DEVICE KIT PT NEEDS METER-STRIPS(1 BOTTLE =100/5 REFILLS)-LANCETS(1 BOX/5 REFILLS) CHECKS BS QD DX: 250.00 1 kit 0  . Calcium Carbonate Antacid (TUMS PO) Take by mouth at bedtime. RARELY WOULD NEED ADDITIONAL TUMS DURING DAY IF SHE ATE SPICY FOODS    . cetirizine (ZYRTEC) 10 MG tablet Take 1 tablet (10 mg total) by mouth daily. 30 tablet 11  . clotrimazole-betamethasone (LOTRISONE) cream Apply 1 application topically 2 (two) times daily. 30 g 3  . colchicine 0.6 MG tablet TAKE 1 TABLET DAILY. TAKE 2 TABS IMMEDIATELY THEN 1 IN AN HOUR IF STILL HURTING 180 tablet 0  . glucose blood (ACCU-CHEK AVIVA PLUS) test strip USE 2 (TWO) TIMES DAILY. CHECK BLOOD SUGAR TWICE DAILY FASTING IN MORNING, 2 HRS AFTER A MEAL E11.65 100 strip 3  . HYDROcodone-acetaminophen (NORCO/VICODIN) 5-325 MG tablet hydrocodone 5 mg-acetaminophen 325 mg tablet  TAKE 1 TABLET EVERY 4 HOURS BY ORAL ROUTE AS NEEDED FOR 5 DAYS.    Marland Kitchen JANUVIA 100 MG tablet TAKE 1 TABLET BY MOUTH EVERY DAY 90 tablet 2  . levothyroxine (SYNTHROID) 75 MCG tablet TAKE 1 TABLET BY MOUTH ONCE DAILY BEFORE BREAKFAST  90 tablet 3  . lisinopril-hydrochlorothiazide (ZESTORETIC) 20-25 MG tablet Take 1 tablet by mouth daily. 90 tablet 2  . meclizine (ANTIVERT) 25 MG tablet Take 1 tablet (25 mg total) by mouth 3 (three) times daily as needed for dizziness. 30 tablet 5  . metFORMIN (GLUCOPHAGE) 500 MG tablet TAKE TWO TABLETS BY MOUTH TWICE A DAY WITH A MEAL 360 tablet 2  . methocarbamol (ROBAXIN) 500 MG tablet methocarbamol 500 mg tablet  Take 1 tablet every day by oral route at bedtime.    . Multiple Vitamin (MULTIVITAMIN WITH MINERALS) TABS Take 1 tablet by mouth daily.    . pioglitazone (ACTOS) 30 MG tablet Take 1 tablet (30 mg total) by mouth daily. 90 tablet 3  . simvastatin (ZOCOR) 80 MG tablet TAKE 1/2 TABLET  BY MOUTH EVERY NIGHT AT BEDTIME 45 tablet 2   No current facility-administered medications on file prior to visit.   Allergies  Allergen Reactions  . Clindamycin/Lincomycin     CLINDAMYCIN TAKEN WITH CIPRO CAUSED SEVERE NAUSEA--PT STATES SHE CAN TAKE CIPRO ALONE WITHOUT PROBLEM--IT WAS JUST THE COMBINATION SHE DID NOT TOLERATE.  Marland Kitchen Levaquin [Levofloxacin] Nausea And Vomiting  . Metronidazole Hcl Other (See Comments)    Deathly sick WHEN PT TOOK METRONIDAZOLE WITH CIPRO.  PT STATES SHE HAS TAKEN CIPRO ALONE WITHOUT PROBLEM --JUST DID NOT TOLERATE THE COMBINATION OF BOTH DRUGS TAKEN TOGETHER.   Social History   Socioeconomic History  . Marital status: Married    Spouse name: Not on file  . Number of children: Not on file  . Years of education: Not on file  . Highest education level: Not on file  Occupational History  . Not on file  Tobacco Use  . Smoking status: Never Smoker  . Smokeless tobacco: Never Used  Substance and Sexual Activity  . Alcohol use: No  . Drug use: No  . Sexual activity: Not on file  Other Topics Concern  . Not on file  Social History Narrative  . Not on file   Social Determinants of Health   Financial Resource Strain: Not on file  Food Insecurity: Not on file  Transportation Needs: Not on file  Physical Activity: Not on file  Stress: Not on file  Social Connections: Not on file  Intimate Partner Violence: Not on file   History reviewed. No pertinent family history.     Review of Systems  All other systems reviewed and are negative.      Objective:   Physical Exam Vitals reviewed.  Constitutional:      General: She is not in acute distress.    Appearance: She is well-developed. She is not diaphoretic.  HENT:     Right Ear: External ear normal.     Left Ear: External ear normal.     Nose: Nose normal.  Neck:     Thyroid: No thyromegaly.     Vascular: No JVD.  Cardiovascular:     Rate and Rhythm: Normal rate and regular rhythm.      Heart sounds: Normal heart sounds. No murmur heard.   Pulmonary:     Effort: Pulmonary effort is normal. No respiratory distress.     Breath sounds: Normal breath sounds. No wheezing or rales.  Abdominal:     General: Bowel sounds are normal. There is no distension.     Palpations: Abdomen is soft. There is no mass.     Tenderness: There is no abdominal tenderness. There is no guarding or rebound.  Musculoskeletal:  Right hand: Swelling and deformity present. Decreased strength.       Arms:     Cervical back: Neck supple.  Skin:    Findings: No erythema or rash.  Neurological:     Mental Status: She is alert and oriented to person, place, and time.     Cranial Nerves: No cranial nerve deficit.     Motor: No abnormal muscle tone.     Coordination: Coordination normal.           Assessment & Plan:  Swelling of joint of upper arm, right - Plan: [place] UPPER EXTREMITY DUPLEX RIGHT (NON-WBI)  Hopefully this is just dependent edema due to the positioning, and the fact that she has been wearing a sling.  However I am concerned given the fact that this is occurred 4 weeks out.  She would be at higher risk for a DVT due to the immobility in the arm and the positioning.  Therefore I will obtain a venous ultrasound of the right upper extremity to rule out a DVT prior to simply switching the patient to Lasix.  If the venous ultrasound is negative, I plan to temporarily discontinue hydrochlorothiazide and switching to Lasix and recommending gripping exercises to increase venous return to the heart from the right upper extremity.  If there is a DVT, the patient will require 3 months of anticoagulation

## 2021-02-15 ENCOUNTER — Telehealth: Payer: Self-pay | Admitting: Family Medicine

## 2021-02-15 NOTE — Telephone Encounter (Signed)
Discussed results with patient on 02/14/2021.

## 2021-02-15 NOTE — Telephone Encounter (Signed)
Patient returned your call yesterday. Please advise at [phone].

## 2021-02-17 ENCOUNTER — Telehealth: Payer: Self-pay | Admitting: *Deleted

## 2021-02-17 NOTE — Telephone Encounter (Signed)
Received call from patient.   Reports that she has been taking Lasix 40mg  for R arm swelling x3 days. States that swelling has improved and her hand is not as tight feeling, but it has not resolved.   Also states that she is performing gripping exercises to attempt to strengthen hand and lower arm. States that she is still sleeping in sling to stabilize upper arm fracture.   Inquired as to if she should continue Lasix.   Please advise.

## 2021-02-17 NOTE — Telephone Encounter (Signed)
Try it for a full week total.

## 2021-02-17 NOTE — Telephone Encounter (Signed)
Call placed to patient and patient made aware.   Advised to contact office on day 6 with update.

## 2021-02-21 ENCOUNTER — Telehealth: Payer: Self-pay | Admitting: Family Medicine

## 2021-02-21 NOTE — Telephone Encounter (Signed)
Patient's spouse Whitney Morales is here for labs;requested we remind provider to call [place] today. She's been on fluid pills for 6 days. Today is #7; provider told patient he'd call her on day #7. Please advise at [phone].

## 2021-02-22 ENCOUNTER — Telehealth: Payer: Self-pay | Admitting: Family Medicine

## 2021-02-22 NOTE — Telephone Encounter (Signed)
Call placed to patient. No answer. No VM.  

## 2021-02-22 NOTE — Telephone Encounter (Signed)
Please see prior message.   

## 2021-02-22 NOTE — Telephone Encounter (Signed)
Call placed to patient.   States that fluid has improved considerably. Reports that hand is WNL, but arm continues to have slight edema.   States that surgeon and PT report that arm fracture is healing well.   Please advise.

## 2021-02-22 NOTE — Telephone Encounter (Signed)
I would stop fluid pills and allow swelling to improve as her functionality improves.

## 2021-02-22 NOTE — Telephone Encounter (Signed)
Pt husband called wanting to ask about pt being taken off of this med, and wanting to know if she should stay off of this med furosemide (LASIX) 40 MG tablet

## 2021-02-22 NOTE — Telephone Encounter (Signed)
Call placed to patient and patient made aware.    Advised to resume BP medications.

## 2021-03-02 ENCOUNTER — Other Ambulatory Visit: Payer: Self-pay

## 2021-03-02 MED ORDER — SIMVASTATIN 80 MG PO TABS: ORAL_TABLET | ORAL | 2 refills | Status: DC

## 2021-03-04 ENCOUNTER — Telehealth: Payer: Self-pay | Admitting: Family Medicine

## 2021-03-04 MED ORDER — SIMVASTATIN 80 MG PO TABS: ORAL_TABLET | ORAL | 2 refills | Status: DC

## 2021-03-04 NOTE — Telephone Encounter (Signed)
Prescription sent to pharmacy.

## 2021-03-04 NOTE — Telephone Encounter (Signed)
Patient called to follow up on refill request from pharmacy for simvastatin (ZOCOR) 80 MG tablet [[id number]]   Pharmacy confirmed as [facility] [address].  Patient almost out of medication. Please advise at [phone].

## 2021-03-07 ENCOUNTER — Telehealth: Payer: Self-pay | Admitting: Family Medicine

## 2021-03-08 ENCOUNTER — Encounter: Payer: Self-pay | Admitting: Family Medicine

## 2021-03-08 ENCOUNTER — Ambulatory Visit: Payer: Medicare Other | Admitting: Family Medicine

## 2021-03-08 ENCOUNTER — Other Ambulatory Visit: Payer: Self-pay | Admitting: Family Medicine

## 2021-03-08 ENCOUNTER — Other Ambulatory Visit: Payer: Self-pay

## 2021-03-08 VITALS — BP 126/80 | HR 94 | Temp 97.9°F | Resp 14 | Ht 63.0 in | Wt 227.0 lb

## 2021-03-08 DIAGNOSIS — E1121 Type 2 diabetes mellitus with diabetic nephropathy: Secondary | ICD-10-CM

## 2021-03-08 DIAGNOSIS — I10 Essential (primary) hypertension: Secondary | ICD-10-CM

## 2021-03-08 DIAGNOSIS — M7989 Other specified soft tissue disorders: Secondary | ICD-10-CM | POA: Diagnosis not present

## 2021-03-08 MED ORDER — SIMVASTATIN 80 MG PO TABS: ORAL_TABLET | ORAL | 2 refills | Status: DC

## 2021-03-08 MED ORDER — LISINOPRIL-HYDROCHLOROTHIAZIDE 20-25 MG PO TABS: 1.0000 | ORAL_TABLET | Freq: Every day | ORAL | 2 refills | Status: DC

## 2021-03-08 MED ORDER — FUROSEMIDE 40 MG PO TABS: 40.0000 mg | ORAL_TABLET | Freq: Every day | ORAL | 3 refills | Status: DC | PRN

## 2021-03-08 NOTE — Progress Notes (Signed)
Subjective:    Patient ID: Whitney Morales, female    DOB: 03-30-1941, 80 y.o.   MRN: [id number]   Wt Readings from Last 3 Encounters:  03/08/21 227 lb (103 kg)  02/14/21 229 lb (103.9 kg)  01/23/21 230 lb (104.3 kg)   Patient presents today for follow-up.  Since she fell and broke her right humerus, she has had significant swelling.  First dose in the right arm.  Notes in her legs distal to her mid shin.  She has +1 pitting edema in both ankles and lower legs.  She denies any chest pain shortness of breath or dyspnea on exertion.  She has not had an echocardiogram in quite some time.  She is on pioglitazone.  Risk factors for heart disease however include age, hypertension, and type 2 diabetes.  Past Medical History:  Diagnosis Date  . Abnormal finding on EKG 10/29/2012   FOLLOW UP NUCLEAR STRESS TEST ON 11/05/12 -NORMAL  . Corneal erosion 2012   RIGHT EYE--HAS RESOLVED--NO PROBLEMS SINCE  . Diabetes mellitus   . Diverticulitis   . Fatigue   . GERD (gastroesophageal reflux disease)    PAST HX--NO PROBLEMS NOW-PT DOES TAKE DAILY TUMS AT BEDTIME  . Goiter    cyst vs goiter on thyroid  . H/O hiatal hernia   . High cholesterol   . Hypertension   . Obesity   . Osteopenia    Past Surgical History:  Procedure Laterality Date  . ABDOMINAL HYSTERECTOMY    . BREAST LUMPECTOMY     BENIGN  . CARDIOVASCULAR STRESS TEST  06/08/2009   EF 78%, NO ISCHEMIA  . THYROID LOBECTOMY  11/27/2012   Procedure: THYROID LOBECTOMY;  Surgeon: Ralene Ok, MD;  Location: [facility];  Service: General;  Laterality: Right;  Right Thyroid Lobectomy  . TONSILLECTOMY    . US ECHOCARDIOGRAPHY  03/23/2006   EF 55-60%   Current Outpatient Medications on File Prior to Visit  Medication Sig Dispense Refill  . Accu-Chek Softclix Lancets lancets USE AS DIRECTED TO CHECK SUGAR 100 each 0  . allopurinol (ZYLOPRIM) 100 MG tablet TAKE 2 TABLETS BY MOUTH EVERY DAY 180 tablet 1  . Blood Glucose Monitoring Suppl  (ACCU-CHEK AVIVA PLUS) W/DEVICE KIT PT NEEDS METER-STRIPS(1 BOTTLE =100/5 REFILLS)-LANCETS(1 BOX/5 REFILLS) CHECKS BS QD DX: 250.00 1 kit 0  . Calcium Carbonate Antacid (TUMS PO) Take by mouth at bedtime. RARELY WOULD NEED ADDITIONAL TUMS DURING DAY IF SHE ATE SPICY FOODS    . cetirizine (ZYRTEC) 10 MG tablet Take 1 tablet (10 mg total) by mouth daily. 30 tablet 11  . clotrimazole-betamethasone (LOTRISONE) cream Apply 1 application topically 2 (two) times daily. 30 g 3  . colchicine 0.6 MG tablet TAKE 1 TABLET DAILY. TAKE 2 TABS IMMEDIATELY THEN 1 IN AN HOUR IF STILL HURTING 180 tablet 0  . furosemide (LASIX) 40 MG tablet Take 1 tablet (40 mg total) by mouth daily as needed (do not take with hydrochlorothiazide). 30 tablet 0  . glucose blood (ACCU-CHEK AVIVA PLUS) test strip USE 2 (TWO) TIMES DAILY. CHECK BLOOD SUGAR TWICE DAILY FASTING IN MORNING, 2 HRS AFTER A MEAL E11.65 100 strip 3  . HYDROcodone-acetaminophen (NORCO/VICODIN) 5-325 MG tablet hydrocodone 5 mg-acetaminophen 325 mg tablet  TAKE 1 TABLET EVERY 4 HOURS BY ORAL ROUTE AS NEEDED FOR 5 DAYS.    Marland Kitchen JANUVIA 100 MG tablet TAKE 1 TABLET BY MOUTH EVERY DAY 90 tablet 2  . levothyroxine (SYNTHROID) 75 MCG tablet TAKE 1 TABLET BY  MOUTH ONCE DAILY BEFORE BREAKFAST 90 tablet 3  . lisinopril-hydrochlorothiazide (ZESTORETIC) 20-25 MG tablet Take 1 tablet by mouth daily. 90 tablet 2  . meclizine (ANTIVERT) 25 MG tablet Take 1 tablet (25 mg total) by mouth 3 (three) times daily as needed for dizziness. 30 tablet 5  . metFORMIN (GLUCOPHAGE) 500 MG tablet TAKE TWO TABLETS BY MOUTH TWICE A DAY WITH A MEAL 360 tablet 2  . methocarbamol (ROBAXIN) 500 MG tablet methocarbamol 500 mg tablet  Take 1 tablet every day by oral route at bedtime.    . Multiple Vitamin (MULTIVITAMIN WITH MINERALS) TABS Take 1 tablet by mouth daily.    . pioglitazone (ACTOS) 30 MG tablet Take 1 tablet (30 mg total) by mouth daily. 90 tablet 3  . simvastatin (ZOCOR) 80 MG tablet TAKE  1/2 TABLET BY MOUTH EVERY NIGHT AT BEDTIME 45 tablet 2   No current facility-administered medications on file prior to visit.   Allergies  Allergen Reactions  . Clindamycin/Lincomycin     CLINDAMYCIN TAKEN WITH CIPRO CAUSED SEVERE NAUSEA--PT STATES SHE CAN TAKE CIPRO ALONE WITHOUT PROBLEM--IT WAS JUST THE COMBINATION SHE DID NOT TOLERATE.  Marland Kitchen Levaquin [Levofloxacin] Nausea And Vomiting  . Metronidazole Hcl Other (See Comments)    Deathly sick WHEN PT TOOK METRONIDAZOLE WITH CIPRO.  PT STATES SHE HAS TAKEN CIPRO ALONE WITHOUT PROBLEM --JUST DID NOT TOLERATE THE COMBINATION OF BOTH DRUGS TAKEN TOGETHER.   Social History   Socioeconomic History  . Marital status: Married    Spouse name: Not on file  . Number of children: Not on file  . Years of education: Not on file  . Highest education level: Not on file  Occupational History  . Not on file  Tobacco Use  . Smoking status: Never Smoker  . Smokeless tobacco: Never Used  Substance and Sexual Activity  . Alcohol use: No  . Drug use: No  . Sexual activity: Not on file  Other Topics Concern  . Not on file  Social History Narrative  . Not on file   Social Determinants of Health   Financial Resource Strain: Not on file  Food Insecurity: Not on file  Transportation Needs: Not on file  Physical Activity: Not on file  Stress: Not on file  Social Connections: Not on file  Intimate Partner Violence: Not on file   History reviewed. No pertinent family history.     Review of Systems  All other systems reviewed and are negative.      Objective:   Physical Exam Vitals reviewed.  Constitutional:      General: She is not in acute distress.    Appearance: She is well-developed. She is not diaphoretic.  HENT:     Right Ear: External ear normal.     Left Ear: External ear normal.     Nose: Nose normal.  Neck:     Thyroid: No thyromegaly.     Vascular: No JVD.  Cardiovascular:     Rate and Rhythm: Normal rate and regular  rhythm.     Heart sounds: Normal heart sounds. No murmur heard.   Pulmonary:     Effort: Pulmonary effort is normal. No respiratory distress.     Breath sounds: Normal breath sounds. No wheezing or rales.  Abdominal:     General: Bowel sounds are normal. There is no distension.     Palpations: Abdomen is soft. There is no mass.     Tenderness: There is no abdominal tenderness. There is no guarding  or rebound.  Musculoskeletal:     Cervical back: Neck supple.     Right lower leg: Edema present.     Left lower leg: Edema present.  Skin:    Findings: No erythema or rash.  Neurological:     Mental Status: She is alert and oriented to person, place, and time.     Cranial Nerves: No cranial nerve deficit.     Motor: No abnormal muscle tone.     Coordination: Coordination normal.           Assessment & Plan:  Controlled type 2 diabetes mellitus with diabetic nephropathy, without long-term current use of insulin ([place]) - Plan: ECHOCARDIOGRAM COMPLETE  Essential hypertension - Plan: ECHOCARDIOGRAM COMPLETE  Leg swelling - Plan: ECHOCARDIOGRAM COMPLETE  I have recommended the patient discontinue pioglitazone.  I also recommended that she permanently discontinue lisinopril/hydrochlorothiazide.  Instead replace with lisinopril 20 mg a day.  Use Lasix 40 mg a day as needed leg swelling.  Schedule echocardiogram to evaluate for any evidence of congestive heart failure however I believe the majority of the swelling could be secondary to the pioglitazone.  Await the results of the echocardiogram.

## 2021-03-15 ENCOUNTER — Other Ambulatory Visit: Payer: Self-pay | Admitting: *Deleted

## 2021-03-15 MED ORDER — SITAGLIPTIN PHOSPHATE 100 MG PO TABS: 100.0000 mg | ORAL_TABLET | Freq: Every day | ORAL | 2 refills | Status: DC

## 2021-03-17 DIAGNOSIS — S42309A Unspecified fracture of shaft of humerus, unspecified arm, initial encounter for closed fracture: Secondary | ICD-10-CM | POA: Insufficient documentation

## 2021-03-23 ENCOUNTER — Other Ambulatory Visit: Payer: Self-pay

## 2021-03-23 ENCOUNTER — Ambulatory Visit (HOSPITAL_COMMUNITY)
Admission: RE | Admit: 2021-03-23 | Discharge: 2021-03-23 | Disposition: A | Discharge status: Home / Self Care | Payer: Medicare Other | Source: Ambulatory Visit | Attending: Family Medicine | Admitting: Family Medicine

## 2021-03-23 DIAGNOSIS — M7989 Other specified soft tissue disorders: Secondary | ICD-10-CM | POA: Diagnosis not present

## 2021-03-23 DIAGNOSIS — E1121 Type 2 diabetes mellitus with diabetic nephropathy: Secondary | ICD-10-CM | POA: Diagnosis not present

## 2021-03-23 DIAGNOSIS — R0609 Other forms of dyspnea: Secondary | ICD-10-CM | POA: Diagnosis not present

## 2021-03-23 DIAGNOSIS — I10 Essential (primary) hypertension: Secondary | ICD-10-CM | POA: Diagnosis not present

## 2021-03-23 DIAGNOSIS — R0600 Dyspnea, unspecified: Secondary | ICD-10-CM | POA: Insufficient documentation

## 2021-03-23 LAB — ECHOCARDIOGRAM COMPLETE
Area-P 1/2: 4.29 cm2
S' Lateral: 3.2 cm

## 2021-03-23 NOTE — Progress Notes (Signed)
  Echocardiogram 2D Echocardiogram has been performed.  Jennette Dubin 03/23/2021, 1:32 PM

## 2021-03-28 ENCOUNTER — Telehealth: Payer: Self-pay | Admitting: Family Medicine

## 2021-03-28 NOTE — Telephone Encounter (Signed)
Pt called in stating that her results from her echocardiagram has been posted in [place], and would like to discuss the results. Pt also stated that her feet are still swelling, and would like to know if she needs to schedule a f/u visit.  Please call [phone]

## 2021-03-29 NOTE — Telephone Encounter (Signed)
Susy Frizzle, MD        Great news, echocardiogram reveals no evidence of heart failure or damage to the heart

## 2021-03-29 NOTE — Telephone Encounter (Signed)
Call placed to patient.   Advised that edema can linger after Fx.   Advised that we will be more than happy to evaluate as needed. States that she will call back if she wants appointment.

## 2021-04-01 ENCOUNTER — Telehealth: Payer: Self-pay | Admitting: Family Medicine

## 2021-04-01 NOTE — Telephone Encounter (Signed)
Left message for patient to call back and schedule Medicare Annual Wellness Visit (AWV) in office.   If not able to come in office, please offer to do virtually or by telephone.   Last AWV: 08/19/2015  Please schedule at anytime with [facility] Health Advisor.  If any questions, please contact me at [phone]

## 2021-04-28 ENCOUNTER — Other Ambulatory Visit: Payer: Self-pay | Admitting: Family Medicine

## 2021-05-02 ENCOUNTER — Other Ambulatory Visit: Payer: Self-pay

## 2021-05-02 ENCOUNTER — Ambulatory Visit: Payer: Medicare Other | Admitting: Family Medicine

## 2021-05-02 ENCOUNTER — Encounter: Payer: Self-pay | Admitting: Family Medicine

## 2021-05-02 VITALS — BP 180/100 | HR 92 | Temp 97.6°F | Ht 63.0 in | Wt 228.6 lb

## 2021-05-02 DIAGNOSIS — M7989 Other specified soft tissue disorders: Secondary | ICD-10-CM

## 2021-05-02 DIAGNOSIS — I10 Essential (primary) hypertension: Secondary | ICD-10-CM

## 2021-05-02 MED ORDER — LISINOPRIL 20 MG PO TABS: 20.0000 mg | ORAL_TABLET | Freq: Every day | ORAL | 3 refills | Status: DC

## 2021-05-02 NOTE — Progress Notes (Signed)
Subjective:    Patient ID: Whitney Morales, female    DOB: Mar 16, 1941, 80 y.o.   MRN: [id number]   Patient has +1 edema in both legs distal to the knee.  She had an echocardiogram performed earlier this year that did show grade 1 diastolic dysfunction but a preserved ejection fraction.  She discontinued lisinopril hydrochlorothiazide and started lisinopril plus Lasix 40 mg a day.  However over the last week she has stopped Lasix because she wanted me to see how the fluid would accumulate in her legs off the fluid pill.  She certainly has some edema today.  However her blood pressure is extremely high on lisinopril alone.  She denies any chest pain shortness of breath or dyspnea on exertion.  She admits to eating a lot more sodium recently with [employer] and hotdogs over 4 July  Past Medical History:  Diagnosis Date   Abnormal finding on EKG 10/29/2012   FOLLOW UP NUCLEAR STRESS TEST ON 11/05/12 -NORMAL   Corneal erosion 2012   RIGHT EYE--HAS RESOLVED--NO PROBLEMS SINCE   Diabetes mellitus    Diverticulitis    Fatigue    GERD (gastroesophageal reflux disease)    PAST HX--NO PROBLEMS NOW-PT DOES TAKE DAILY TUMS AT BEDTIME   Goiter    cyst vs goiter on thyroid   H/O hiatal hernia    High cholesterol    Hypertension    Obesity    Osteopenia    Past Surgical History:  Procedure Laterality Date   ABDOMINAL HYSTERECTOMY     BREAST LUMPECTOMY     BENIGN   CARDIOVASCULAR STRESS TEST  06/08/2009   EF 78%, NO ISCHEMIA   THYROID LOBECTOMY  11/27/2012   Procedure: THYROID LOBECTOMY;  Surgeon: Ralene Ok, MD;  Location: [facility];  Service: General;  Laterality: Right;  Right Thyroid Lobectomy   TONSILLECTOMY     US ECHOCARDIOGRAPHY  03/23/2006   EF 55-60%   Current Outpatient Medications on File Prior to Visit  Medication Sig Dispense Refill   Accu-Chek Softclix Lancets lancets USE AS DIRECTED TO CHECK SUGAR 100 each 0   allopurinol (ZYLOPRIM) 100 MG tablet TAKE 2 TABLETS BY MOUTH  EVERY DAY 180 tablet 1   Blood Glucose Monitoring Suppl (ACCU-CHEK AVIVA PLUS) W/DEVICE KIT PT NEEDS METER-STRIPS(1 BOTTLE =100/5 REFILLS)-LANCETS(1 BOX/5 REFILLS) CHECKS BS QD DX: 250.00 1 kit 0   Calcium Carbonate Antacid (TUMS PO) Take by mouth at bedtime. RARELY WOULD NEED ADDITIONAL TUMS DURING DAY IF SHE ATE SPICY FOODS     cetirizine (ZYRTEC) 10 MG tablet Take 1 tablet (10 mg total) by mouth daily. 30 tablet 11   clotrimazole-betamethasone (LOTRISONE) cream Apply 1 application topically 2 (two) times daily. 30 g 3   colchicine 0.6 MG tablet TAKE 1 TABLET DAILY. TAKE 2 TABS IMMEDIATELY THEN 1 IN AN HOUR IF STILL HURTING 180 tablet 0   furosemide (LASIX) 40 MG tablet Take 1 tablet (40 mg total) by mouth daily as needed (do not take with hydrochlorothiazide). 30 tablet 3   glucose blood (ACCU-CHEK AVIVA PLUS) test strip USE 2 (TWO) TIMES DAILY. CHECK BLOOD SUGAR TWICE DAILY FASTING IN MORNING, 2 HRS AFTER A MEAL E11.65 100 strip 3   HYDROcodone-acetaminophen (NORCO/VICODIN) 5-325 MG tablet hydrocodone 5 mg-acetaminophen 325 mg tablet  TAKE 1 TABLET EVERY 4 HOURS BY ORAL ROUTE AS NEEDED FOR 5 DAYS.     levothyroxine (SYNTHROID) 75 MCG tablet TAKE 1 TABLET BY MOUTH ONCE DAILY BEFORE BREAKFAST 90 tablet 3  meclizine (ANTIVERT) 25 MG tablet Take 1 tablet (25 mg total) by mouth 3 (three) times daily as needed for dizziness. 30 tablet 5   metFORMIN (GLUCOPHAGE) 500 MG tablet TAKE TWO TABLETS BY MOUTH TWICE A DAY WITH A MEAL 360 tablet 2   methocarbamol (ROBAXIN) 500 MG tablet methocarbamol 500 mg tablet  Take 1 tablet every day by oral route at bedtime.     Multiple Vitamin (MULTIVITAMIN WITH MINERALS) TABS Take 1 tablet by mouth daily.     simvastatin (ZOCOR) 80 MG tablet TAKE 1/2 TABLET BY MOUTH EVERY NIGHT AT BEDTIME 45 tablet 2   sitaGLIPtin (JANUVIA) 100 MG tablet Take 1 tablet (100 mg total) by mouth daily. 90 tablet 2   No current facility-administered medications on file prior to visit.    Allergies  Allergen Reactions   Clindamycin/Lincomycin     CLINDAMYCIN TAKEN WITH CIPRO CAUSED SEVERE NAUSEA--PT STATES SHE CAN TAKE CIPRO ALONE WITHOUT PROBLEM--IT WAS JUST THE COMBINATION SHE DID NOT TOLERATE.   Levaquin [Levofloxacin] Nausea And Vomiting   Metronidazole Hcl Other (See Comments)    Deathly sick WHEN PT TOOK METRONIDAZOLE WITH CIPRO.  PT STATES SHE HAS TAKEN CIPRO ALONE WITHOUT PROBLEM --JUST DID NOT TOLERATE THE COMBINATION OF BOTH DRUGS TAKEN TOGETHER.   Social History   Socioeconomic History   Marital status: Married    Spouse name: Not on file   Number of children: Not on file   Years of education: Not on file   Highest education level: Not on file  Occupational History   Not on file  Tobacco Use   Smoking status: Never   Smokeless tobacco: Never  Substance and Sexual Activity   Alcohol use: No   Drug use: No   Sexual activity: Not on file  Other Topics Concern   Not on file  Social History Narrative   Not on file   Social Determinants of Health   Financial Resource Strain: Not on file  Food Insecurity: Not on file  Transportation Needs: Not on file  Physical Activity: Not on file  Stress: Not on file  Social Connections: Not on file  Intimate Partner Violence: Not on file   History reviewed. No pertinent family history.     Review of Systems  All other systems reviewed and are negative.     Objective:   Physical Exam Vitals reviewed.  Constitutional:      General: She is not in acute distress.    Appearance: She is well-developed. She is not diaphoretic.  HENT:     Right Ear: External ear normal.     Left Ear: External ear normal.     Nose: Nose normal.  Neck:     Thyroid: No thyromegaly.     Vascular: No JVD.  Cardiovascular:     Rate and Rhythm: Normal rate and regular rhythm.     Heart sounds: Normal heart sounds. No murmur heard. Pulmonary:     Effort: Pulmonary effort is normal. No respiratory distress.     Breath  sounds: Normal breath sounds. No wheezing or rales.  Abdominal:     General: Bowel sounds are normal. There is no distension.     Palpations: Abdomen is soft. There is no mass.     Tenderness: There is no abdominal tenderness. There is no guarding or rebound.  Musculoskeletal:     Cervical back: Neck supple.     Right lower leg: Edema present.     Left lower leg: Edema present.  Skin:    Findings: No erythema or rash.  Neurological:     Mental Status: She is alert and oriented to person, place, and time.     Cranial Nerves: No cranial nerve deficit.     Motor: No abnormal muscle tone.     Coordination: Coordination normal.          Assessment & Plan:  Essential hypertension  Leg swelling I believe her leg swelling is due to a combination of diastolic dysfunction and chronic venous insufficiency.  Resume Lasix 40 mg a day scheduled.  Also recommended compression hose.  Recheck blood pressure daily and notify me of the blood pressures later this week.  If consistently greater than 140/90, we may need to add additional medication to her lisinopril.  Also return fasting at her earliest convenience for a CMP, lipid panel, and an A1c, and a TSH.

## 2021-05-10 ENCOUNTER — Telehealth: Payer: Self-pay | Admitting: *Deleted

## 2021-05-10 NOTE — Telephone Encounter (Signed)
Received list of patient BP Readings. Noted readings on average 126- 180/ 60-100.  PCP made aware and recommendations are as follows: Although BP is improved, it is still too high. Increase Lisinopril to 40mg  PO QD.  Call placed to patient. No answer. No VM.

## 2021-05-11 NOTE — Telephone Encounter (Signed)
Call placed to patient.   Reports that she has noted her BP readings have decreased. BP readings are as follows: 18-Jul 136/73  19-Jul 130/72  20-Jul 145*75   Patient reports that she does not feel she needs to change Lisinopril at this time, but will continue to monitor readings.

## 2021-06-06 ENCOUNTER — Telehealth: Payer: Self-pay | Admitting: *Deleted

## 2021-06-06 NOTE — Telephone Encounter (Signed)
Received BP log as follows:  AM   PM    BP HR  BP HR  18-Jul 136/73 89  ----- -----  19-Jul 130/72 92  ----- -----  20-Jul 145/75 83  134/66 92  21-Jul 137/69 89  148/75 93  22-Jul 143/69 86  160/76 85  23-Jul 157/80 83  141/73 88  24-Jul 136/73 79  142/65 86  25-Jul 130/62 89  ----- -----  26-Jul ----- -----  148/74 85  27-Jul ----- -----  146/65 87  28-Jul 148/73 85  ----- -----  29-Jul 131/70 83  ----- -----  30-Jul 136/63 84  139/71 84  31-Jul 137/68 83  ----- -----  2-Aug 143/64 89  ----- -----   PCP made aware and recommendations are as follows: BP is borderline Continue to monitor Goal <140/90  Patient aware per MyChart.

## 2021-06-08 ENCOUNTER — Other Ambulatory Visit: Payer: Self-pay | Admitting: Family Medicine

## 2021-06-21 ENCOUNTER — Other Ambulatory Visit: Payer: Medicare Other

## 2021-06-21 ENCOUNTER — Other Ambulatory Visit: Payer: Self-pay

## 2021-06-21 DIAGNOSIS — E7800 Pure hypercholesterolemia, unspecified: Secondary | ICD-10-CM

## 2021-06-21 DIAGNOSIS — I10 Essential (primary) hypertension: Secondary | ICD-10-CM

## 2021-06-21 DIAGNOSIS — E1121 Type 2 diabetes mellitus with diabetic nephropathy: Secondary | ICD-10-CM

## 2021-06-21 DIAGNOSIS — Z13220 Encounter for screening for lipoid disorders: Secondary | ICD-10-CM

## 2021-06-21 DIAGNOSIS — E038 Other specified hypothyroidism: Secondary | ICD-10-CM

## 2021-06-22 LAB — COMPLETE METABOLIC PANEL WITH GFR
AG Ratio: 1.5 (calc) (ref 1.0–2.5)
ALT: 13 U/L (ref 6–29)
AST: 12 U/L (ref 10–35)
Albumin: 4.2 g/dL (ref 3.6–5.1)
Alkaline phosphatase (APISO): 62 U/L (ref 37–153)
BUN/Creatinine Ratio: 14 (calc) (ref 6–22)
BUN: 18 mg/dL (ref 7–25)
CO2: 27 mmol/L (ref 20–32)
Calcium: 9.9 mg/dL (ref 8.6–10.4)
Chloride: 102 mmol/L (ref 98–110)
Creat: 1.3 mg/dL — ABNORMAL HIGH (ref 0.60–1.00)
Globulin: 2.8 g/dL (calc) (ref 1.9–3.7)
Glucose, Bld: 190 mg/dL — ABNORMAL HIGH (ref 65–99)
Potassium: 5 mmol/L (ref 3.5–5.3)
Sodium: 139 mmol/L (ref 135–146)
Total Bilirubin: 0.3 mg/dL (ref 0.2–1.2)
Total Protein: 7 g/dL (ref 6.1–8.1)
eGFR: 42 mL/min/{1.73_m2} — ABNORMAL LOW (ref >=60)

## 2021-06-22 LAB — CBC WITH DIFFERENTIAL/PLATELET
Absolute Monocytes: 751 cells/uL (ref 200–950)
Basophils Absolute: 105 cells/uL (ref 0–200)
Basophils Relative: 1.1 %
Eosinophils Absolute: 143 cells/uL (ref 15–500)
Eosinophils Relative: 1.5 %
HCT: 37 % (ref 35.0–45.0)
Hemoglobin: 12 g/dL (ref 11.7–15.5)
Lymphs Abs: 2195 cells/uL (ref 850–3900)
MCH: 30.2 pg (ref 27.0–33.0)
MCHC: 32.4 g/dL (ref 32.0–36.0)
MCV: 93.2 fL (ref 80.0–100.0)
MPV: 10.3 fL (ref 7.5–12.5)
Monocytes Relative: 7.9 %
Neutro Abs: 6308 cells/uL (ref 1500–7800)
Neutrophils Relative %: 66.4 %
Platelets: 341 10*3/uL (ref 140–400)
RBC: 3.97 10*6/uL (ref 3.80–5.10)
RDW: 13.2 % (ref 11.0–15.0)
Total Lymphocyte: 23.1 %
WBC: 9.5 10*3/uL (ref 3.8–10.8)

## 2021-06-22 LAB — HEMOGLOBIN A1C
Hgb A1c MFr Bld: 7.5 % of total Hgb — ABNORMAL HIGH (ref <5.7)
Mean Plasma Glucose: 169 mg/dL
eAG (mmol/L): 9.3 mmol/L

## 2021-06-22 LAB — LIPID PANEL
Cholesterol: 146 mg/dL (ref <200)
HDL: 33 mg/dL — ABNORMAL LOW (ref >=50)
LDL Cholesterol (Calc): 84 mg/dL (calc)
Non-HDL Cholesterol (Calc): 113 mg/dL (calc) (ref <130)
Total CHOL/HDL Ratio: 4.4 (calc) (ref <5.0)
Triglycerides: 194 mg/dL — ABNORMAL HIGH (ref <150)

## 2021-06-22 LAB — TSH: TSH: 6.14 mIU/L — ABNORMAL HIGH (ref 0.40–4.50)

## 2021-06-24 ENCOUNTER — Ambulatory Visit: Payer: Medicare Other | Admitting: Family Medicine

## 2021-06-24 ENCOUNTER — Encounter: Payer: Self-pay | Admitting: Family Medicine

## 2021-06-24 ENCOUNTER — Other Ambulatory Visit: Payer: Self-pay

## 2021-06-24 VITALS — BP 126/78 | HR 86 | Temp 98.3°F | Resp 14 | Ht 63.0 in | Wt 228.0 lb

## 2021-06-24 DIAGNOSIS — I10 Essential (primary) hypertension: Secondary | ICD-10-CM

## 2021-06-24 DIAGNOSIS — E7800 Pure hypercholesterolemia, unspecified: Secondary | ICD-10-CM | POA: Diagnosis not present

## 2021-06-24 DIAGNOSIS — E038 Other specified hypothyroidism: Secondary | ICD-10-CM | POA: Diagnosis not present

## 2021-06-24 DIAGNOSIS — E1121 Type 2 diabetes mellitus with diabetic nephropathy: Secondary | ICD-10-CM | POA: Diagnosis not present

## 2021-06-24 MED ORDER — LEVOTHYROXINE SODIUM 88 MCG PO TABS: 88.0000 ug | ORAL_TABLET | Freq: Every day | ORAL | 3 refills | Status: DC

## 2021-06-24 NOTE — Progress Notes (Signed)
Subjective:    Patient ID: Whitney Morales, female    DOB: December 27, 1940, 80 y.o.   MRN: [id number]   Lab on 06/21/2021  Component Date Value Ref Range Status   WBC 06/21/2021 9.5  3.8 - 10.8 Thousand/uL Final   RBC 06/21/2021 3.97  3.80 - 5.10 Million/uL Final   Hemoglobin 06/21/2021 12.0  11.7 - 15.5 g/dL Final   HCT 06/21/2021 37.0  35.0 - 45.0 % Final   MCV 06/21/2021 93.2  80.0 - 100.0 fL Final   MCH 06/21/2021 30.2  27.0 - 33.0 pg Final   MCHC 06/21/2021 32.4  32.0 - 36.0 g/dL Final   RDW 06/21/2021 13.2  11.0 - 15.0 % Final   Platelets 06/21/2021 341  140 - 400 Thousand/uL Final   MPV 06/21/2021 10.3  7.5 - 12.5 fL Final   Neutro Abs 06/21/2021 6,308  1,500 - 7,800 cells/uL Final   Lymphs Abs 06/21/2021 2,195  850 - 3,900 cells/uL Final   Absolute Monocytes 06/21/2021 751  200 - 950 cells/uL Final   Eosinophils Absolute 06/21/2021 143  15 - 500 cells/uL Final   Basophils Absolute 06/21/2021 105  0 - 200 cells/uL Final   Neutrophils Relative % 06/21/2021 66.4  % Final   Total Lymphocyte 06/21/2021 23.1  % Final   Monocytes Relative 06/21/2021 7.9  % Final   Eosinophils Relative 06/21/2021 1.5  % Final   Basophils Relative 06/21/2021 1.1  % Final   Glucose, Bld 06/21/2021 190 (A) 65 - 99 mg/dL Final   Comment: .            Fasting reference interval . For someone without known diabetes, a glucose value >125 mg/dL indicates that they may have diabetes and this should be confirmed with a follow-up test. .    BUN 06/21/2021 18  7 - 25 mg/dL Final   Creat 06/21/2021 1.30 (A) 0.60 - 1.00 mg/dL Final   eGFR 06/21/2021 42 (A) > OR = 60 mL/min/1.9m Final   Comment: The eGFR is based on the CKD-EPI 2021 equation. To calculate  the new eGFR from a previous Creatinine or Cystatin C result, go to https://www.kidney.org/professionals/ kdoqi/gfr%5Fcalculator    BUN/Creatinine Ratio 06/21/2021 14  6 - 22 (calc) Final   Sodium 06/21/2021 139  135 - 146 mmol/L Final   Potassium  06/21/2021 5.0  3.5 - 5.3 mmol/L Final   Chloride 06/21/2021 102  98 - 110 mmol/L Final   CO2 06/21/2021 27  20 - 32 mmol/L Final   Calcium 06/21/2021 9.9  8.6 - 10.4 mg/dL Final   Total Protein 06/21/2021 7.0  6.1 - 8.1 g/dL Final   Albumin 06/21/2021 4.2  3.6 - 5.1 g/dL Final   Globulin 06/21/2021 2.8  1.9 - 3.7 g/dL (calc) Final   AG Ratio 06/21/2021 1.5  1.0 - 2.5 (calc) Final   Total Bilirubin 06/21/2021 0.3  0.2 - 1.2 mg/dL Final   Alkaline phosphatase (APISO) 06/21/2021 62  37 - 153 U/L Final   AST 06/21/2021 12  10 - 35 U/L Final   ALT 06/21/2021 13  6 - 29 U/L Final   Cholesterol 06/21/2021 146  <200 mg/dL Final   HDL 06/21/2021 33 (A) > OR = 50 mg/dL Final   Triglycerides 06/21/2021 194 (A) <150 mg/dL Final   LDL Cholesterol (Calc) 06/21/2021 84  mg/dL (calc) Final   Comment: Reference range: <100 . Desirable range <100 mg/dL for primary prevention;   <70 mg/dL for patients with CHD or diabetic  patients  with > or = 2 CHD risk factors. Marland Kitchen LDL-C is now calculated using the Martin-Hopkins  calculation, which is a validated novel method providing  better accuracy than the Friedewald equation in the  estimation of LDL-C.  Cresenciano Genre et al. Annamaria Helling. 0092;330(07): 2061-2068  (http://education.[facility].com/faq/FAQ164)    Total CHOL/HDL Ratio 06/21/2021 4.4  <5.0 (calc) Final   Non-HDL Cholesterol (Calc) 06/21/2021 113  <130 mg/dL (calc) Final   Comment: For patients with diabetes plus 1 major ASCVD risk  factor, treating to a non-HDL-C goal of <100 mg/dL  (LDL-C of <70 mg/dL) is considered a therapeutic  option.    TSH 06/21/2021 6.14 (A) 0.40 - 4.50 mIU/L Final   Hgb A1c MFr Bld 06/21/2021 7.5 (A) <5.7 % of total Hgb Final   Comment: For someone without known diabetes, a hemoglobin A1c value of 6.5% or greater indicates that they may have  diabetes and this should be confirmed with a follow-up  test. . For someone with known diabetes, a value <7% indicates  that  their diabetes is well controlled and a value  greater than or equal to 7% indicates suboptimal  control. A1c targets should be individualized based on  duration of diabetes, age, comorbid conditions, and  other considerations. . Currently, no consensus exists regarding use of hemoglobin A1c for diagnosis of diabetes for children. .    Mean Plasma Glucose 06/21/2021 169  mg/dL Final   eAG (mmol/L) 06/21/2021 9.3  mmol/L Final   Patient is a very sweet 80 year old Caucasian female here today for a checkup.  Her blood pressure is excellent today despite having stopped hydrochlorothiazide and replacing with Lasix.  The Lasix seems to be doing a good job of keeping the swelling down in her arms and in her legs.  She denies any chest pain or shortness of breath.  She had an echocardiogram that showed mild diastolic dysfunction but otherwise an ejection fraction of 55 to 60% which was reassuring.  She is still on lisinopril both for blood pressure control and for renal protection.  Her most recent lab work is listed above and shows that her A1c has risen to 7.5.  This is explained because we stopped Actos due to the diffuse swelling that she was experiencing.  Her TSH is also elevated despite being on levothyroxine 75 mcg a day.  She does complain of fatigue and lack of stamina. Past Medical History:  Diagnosis Date   Abnormal finding on EKG 10/29/2012   FOLLOW UP NUCLEAR STRESS TEST ON 11/05/12 -NORMAL   Corneal erosion 2012   RIGHT EYE--HAS RESOLVED--NO PROBLEMS SINCE   Diabetes mellitus    Diverticulitis    Fatigue    GERD (gastroesophageal reflux disease)    PAST HX--NO PROBLEMS NOW-PT DOES TAKE DAILY TUMS AT BEDTIME   Goiter    cyst vs goiter on thyroid   H/O hiatal hernia    High cholesterol    Hypertension    Obesity    Osteopenia    Past Surgical History:  Procedure Laterality Date   ABDOMINAL HYSTERECTOMY     BREAST LUMPECTOMY     BENIGN   CARDIOVASCULAR STRESS TEST  06/08/2009    EF 78%, NO ISCHEMIA   THYROID LOBECTOMY  11/27/2012   Procedure: THYROID LOBECTOMY;  Surgeon: Ralene Ok, MD;  Location: [facility];  Service: General;  Laterality: Right;  Right Thyroid Lobectomy   TONSILLECTOMY     US ECHOCARDIOGRAPHY  03/23/2006   EF 55-60%   Current Outpatient  Medications on File Prior to Visit  Medication Sig Dispense Refill   Accu-Chek Softclix Lancets lancets USE AS DIRECTED TO CHECK SUGAR 100 each 0   allopurinol (ZYLOPRIM) 100 MG tablet TAKE 2 TABLETS BY MOUTH EVERY DAY 180 tablet 1   Blood Glucose Monitoring Suppl (ACCU-CHEK AVIVA PLUS) W/DEVICE KIT PT NEEDS METER-STRIPS(1 BOTTLE =100/5 REFILLS)-LANCETS(1 BOX/5 REFILLS) CHECKS BS QD DX: 250.00 1 kit 0   Calcium Carbonate Antacid (TUMS PO) Take by mouth at bedtime. RARELY WOULD NEED ADDITIONAL TUMS DURING DAY IF SHE ATE SPICY FOODS     cetirizine (ZYRTEC) 10 MG tablet Take 1 tablet (10 mg total) by mouth daily. 30 tablet 11   clotrimazole-betamethasone (LOTRISONE) cream Apply 1 application topically 2 (two) times daily. 30 g 3   colchicine 0.6 MG tablet TAKE 1 TABLET DAILY. TAKE 2 TABS IMMEDIATELY THEN 1 IN AN HOUR IF STILL HURTING 180 tablet 0   furosemide (LASIX) 40 MG tablet TAKE 1 TABLET (40 MG TOTAL) BY MOUTH DAILY AS NEEDED (DO NOT TAKE WITH HYDROCHLOROTHIAZIDE). 90 tablet 1   glucose blood (ACCU-CHEK AVIVA PLUS) test strip USE 2 (TWO) TIMES DAILY. CHECK BLOOD SUGAR TWICE DAILY FASTING IN MORNING, 2 HRS AFTER A MEAL E11.65 100 strip 3   HYDROcodone-acetaminophen (NORCO/VICODIN) 5-325 MG tablet hydrocodone 5 mg-acetaminophen 325 mg tablet  TAKE 1 TABLET EVERY 4 HOURS BY ORAL ROUTE AS NEEDED FOR 5 DAYS.     levothyroxine (SYNTHROID) 75 MCG tablet TAKE 1 TABLET BY MOUTH ONCE DAILY BEFORE BREAKFAST 90 tablet 3   lisinopril (ZESTRIL) 20 MG tablet Take 1 tablet (20 mg total) by mouth daily. 90 tablet 3   meclizine (ANTIVERT) 25 MG tablet Take 1 tablet (25 mg total) by mouth 3 (three) times daily as needed for  dizziness. 30 tablet 5   metFORMIN (GLUCOPHAGE) 500 MG tablet TAKE TWO TABLETS BY MOUTH TWICE A DAY WITH A MEAL 360 tablet 2   methocarbamol (ROBAXIN) 500 MG tablet methocarbamol 500 mg tablet  Take 1 tablet every day by oral route at bedtime.     Multiple Vitamin (MULTIVITAMIN WITH MINERALS) TABS Take 1 tablet by mouth daily.     simvastatin (ZOCOR) 80 MG tablet TAKE 1/2 TABLET BY MOUTH EVERY NIGHT AT BEDTIME 45 tablet 2   sitaGLIPtin (JANUVIA) 100 MG tablet Take 1 tablet (100 mg total) by mouth daily. 90 tablet 2   No current facility-administered medications on file prior to visit.   Allergies  Allergen Reactions   Clindamycin/Lincomycin     CLINDAMYCIN TAKEN WITH CIPRO CAUSED SEVERE NAUSEA--PT STATES SHE CAN TAKE CIPRO ALONE WITHOUT PROBLEM--IT WAS JUST THE COMBINATION SHE DID NOT TOLERATE.   Levaquin [Levofloxacin] Nausea And Vomiting   Metronidazole Hcl Other (See Comments)    Deathly sick WHEN PT TOOK METRONIDAZOLE WITH CIPRO.  PT STATES SHE HAS TAKEN CIPRO ALONE WITHOUT PROBLEM --JUST DID NOT TOLERATE THE COMBINATION OF BOTH DRUGS TAKEN TOGETHER.   Social History   Socioeconomic History   Marital status: Married    Spouse name: Not on file   Number of children: Not on file   Years of education: Not on file   Highest education level: Not on file  Occupational History   Not on file  Tobacco Use   Smoking status: Never   Smokeless tobacco: Never  Substance and Sexual Activity   Alcohol use: No   Drug use: No   Sexual activity: Not on file  Other Topics Concern   Not on file  Social History  Narrative   Not on file   Social Determinants of Health   Financial Resource Strain: Not on file  Food Insecurity: Not on file  Transportation Needs: Not on file  Physical Activity: Not on file  Stress: Not on file  Social Connections: Not on file  Intimate Partner Violence: Not on file   No family history on file.     Review of Systems  All other systems reviewed and  are negative.     Objective:   Physical Exam Vitals reviewed.  Constitutional:      General: She is not in acute distress.    Appearance: She is well-developed. She is not diaphoretic.  HENT:     Right Ear: External ear normal.     Left Ear: External ear normal.     Nose: Nose normal.  Neck:     Thyroid: No thyromegaly.     Vascular: No JVD.  Cardiovascular:     Rate and Rhythm: Normal rate and regular rhythm.     Heart sounds: Normal heart sounds. No murmur heard. Pulmonary:     Effort: Pulmonary effort is normal. No respiratory distress.     Breath sounds: Normal breath sounds. No wheezing or rales.  Abdominal:     General: Bowel sounds are normal. There is no distension.     Palpations: Abdomen is soft. There is no mass.     Tenderness: There is no abdominal tenderness. There is no guarding or rebound.  Musculoskeletal:     Cervical back: Neck supple.     Right lower leg: No edema.     Left lower leg: No edema.  Skin:    Findings: No erythema or rash.  Neurological:     Mental Status: She is alert and oriented to person, place, and time.     Cranial Nerves: No cranial nerve deficit.     Motor: No abnormal muscle tone.     Coordination: Coordination normal.          Assessment & Plan:  Essential hypertension  Controlled type 2 diabetes mellitus with diabetic nephropathy, without long-term current use of insulin (HCC)  Pure hypercholesterolemia  Other specified hypothyroidism Patient's blood pressure is outstanding.  We discussed her A1c and I recommended trying Wilder Glade given her chronic kidney disease and type 2 diabetes however she would like to try lifestyle changes to lower her A1c below 7.  We will recheck in 3 months and see where we stand.  Her cholesterol is excellent however her TSH remains elevated so we will increase her levothyroxine to 88 mcg a day and recheck a TSH in 8 weeks.

## 2021-08-03 LAB — HM DIABETES EYE EXAM

## 2021-08-04 ENCOUNTER — Encounter: Payer: Self-pay | Admitting: Family Medicine

## 2021-08-12 ENCOUNTER — Other Ambulatory Visit: Payer: Self-pay

## 2021-08-12 MED ORDER — METFORMIN HCL 500 MG PO TABS: ORAL_TABLET | ORAL | 2 refills | Status: DC

## 2021-08-26 ENCOUNTER — Other Ambulatory Visit: Payer: Self-pay | Admitting: *Deleted

## 2021-08-26 MED ORDER — METFORMIN HCL 500 MG PO TABS: ORAL_TABLET | ORAL | 2 refills | Status: DC

## 2021-09-27 ENCOUNTER — Other Ambulatory Visit: Payer: Medicare Other

## 2021-09-27 ENCOUNTER — Other Ambulatory Visit: Payer: Self-pay

## 2021-09-27 DIAGNOSIS — E1121 Type 2 diabetes mellitus with diabetic nephropathy: Secondary | ICD-10-CM

## 2021-09-27 DIAGNOSIS — E038 Other specified hypothyroidism: Secondary | ICD-10-CM

## 2021-09-27 LAB — BASIC METABOLIC PANEL
BUN/Creatinine Ratio: 17 (calc) (ref 6–22)
BUN: 22 mg/dL (ref 7–25)
CO2: 27 mmol/L (ref 20–32)
Calcium: 9.6 mg/dL (ref 8.6–10.4)
Chloride: 102 mmol/L (ref 98–110)
Creat: 1.29 mg/dL — ABNORMAL HIGH (ref 0.60–1.00)
Glucose, Bld: 213 mg/dL — ABNORMAL HIGH (ref 65–99)
Potassium: 4.8 mmol/L (ref 3.5–5.3)
Sodium: 140 mmol/L (ref 135–146)

## 2021-09-27 LAB — TSH: TSH: 4.01 mIU/L (ref 0.40–4.50)

## 2021-09-27 NOTE — Addendum Note (Signed)
Addended by: Wadie Lessen on: 09/27/2021 08:06 AM   Modules accepted: Orders

## 2021-09-29 ENCOUNTER — Encounter: Payer: Self-pay | Admitting: Family Medicine

## 2021-09-29 ENCOUNTER — Other Ambulatory Visit: Payer: Self-pay

## 2021-09-29 ENCOUNTER — Encounter (HOSPITAL_BASED_OUTPATIENT_CLINIC_OR_DEPARTMENT_OTHER): Payer: Self-pay

## 2021-09-29 ENCOUNTER — Ambulatory Visit (INDEPENDENT_AMBULATORY_CARE_PROVIDER_SITE_OTHER): Payer: Medicare Other | Admitting: Family Medicine

## 2021-09-29 VITALS — BP 162/82 | HR 92 | Resp 18 | Ht 63.0 in | Wt 226.0 lb

## 2021-09-29 DIAGNOSIS — Z79899 Other long term (current) drug therapy: Secondary | ICD-10-CM | POA: Diagnosis not present

## 2021-09-29 DIAGNOSIS — E1165 Type 2 diabetes mellitus with hyperglycemia: Secondary | ICD-10-CM | POA: Diagnosis not present

## 2021-09-29 DIAGNOSIS — E119 Type 2 diabetes mellitus without complications: Secondary | ICD-10-CM | POA: Diagnosis not present

## 2021-09-29 DIAGNOSIS — Z7984 Long term (current) use of oral hypoglycemic drugs: Secondary | ICD-10-CM | POA: Insufficient documentation

## 2021-09-29 DIAGNOSIS — E1100 Type 2 diabetes mellitus with hyperosmolarity without nonketotic hyperglycemic-hyperosmolar coma (NKHHC): Secondary | ICD-10-CM

## 2021-09-29 DIAGNOSIS — I10 Essential (primary) hypertension: Secondary | ICD-10-CM | POA: Insufficient documentation

## 2021-09-29 DIAGNOSIS — E039 Hypothyroidism, unspecified: Secondary | ICD-10-CM | POA: Diagnosis not present

## 2021-09-29 MED ORDER — HYDROCHLOROTHIAZIDE 25 MG PO TABS: 25.0000 mg | ORAL_TABLET | Freq: Every day | ORAL | 3 refills | Status: DC

## 2021-09-29 MED ORDER — GLIPIZIDE ER 5 MG PO TB24: 5.0000 mg | ORAL_TABLET | Freq: Every day | ORAL | 1 refills | Status: DC

## 2021-09-29 NOTE — ED Triage Notes (Signed)
Pt presents POV stating blood pressure has been high today.  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  States she did take Lisinopril this am.  States she does not generally feel well today but no specific complaints of pain.

## 2021-09-29 NOTE — Progress Notes (Signed)
Subjective:    Patient ID: Whitney Morales, female    DOB: Aug 16, 1941, 80 y.o.   MRN: [id number] Patient states that she feels bad.  She states that she feels like she may be dehydrated.  She feels like fluid pill/Lasix is too strong and dehydrating her.  Her blood pressure however is elevated today at 162/82.  Her blood sugars have also been elevated.  Her fasting blood sugar yesterday was 213.  However her random sugars are much higher than 200 and her fasting blood sugars are below 90.  We stopped Actos due to leg swelling however it seems that her sugars have spiraled out of control.  Appointment on 09/27/2021  Component Date Value Ref Range Status   TSH 09/27/2021 4.01  0.40 - 4.50 mIU/L Final   Glucose, Bld 09/27/2021 213 (H)  65 - 99 mg/dL Final   Comment: .            Fasting reference interval . For someone without known diabetes, a glucose value >125 mg/dL indicates that they may have diabetes and this should be confirmed with a follow-up test. .    BUN 09/27/2021 22  7 - 25 mg/dL Final   Creat 09/27/2021 1.29 (H)  0.60 - 1.00 mg/dL Final   BUN/Creatinine Ratio 09/27/2021 17  6 - 22 (calc) Final   Sodium 09/27/2021 140  135 - 146 mmol/L Final   Potassium 09/27/2021 4.8  3.5 - 5.3 mmol/L Final   Chloride 09/27/2021 102  98 - 110 mmol/L Final   CO2 09/27/2021 27  20 - 32 mmol/L Final   Calcium 09/27/2021 9.6  8.6 - 10.4 mg/dL Final   Past Medical History:  Diagnosis Date   Abnormal finding on EKG 10/29/2012   FOLLOW UP NUCLEAR STRESS TEST ON 11/05/12 -NORMAL   Corneal erosion 2012   RIGHT EYE--HAS RESOLVED--NO PROBLEMS SINCE   Diabetes mellitus    Diverticulitis    Fatigue    GERD (gastroesophageal reflux disease)    PAST HX--NO PROBLEMS NOW-PT DOES TAKE DAILY TUMS AT BEDTIME   Goiter    cyst vs goiter on thyroid   H/O hiatal hernia    High cholesterol    Hypertension    Obesity    Osteopenia    Past Surgical History:  Procedure Laterality Date   ABDOMINAL  HYSTERECTOMY     BREAST LUMPECTOMY     BENIGN   CARDIOVASCULAR STRESS TEST  06/08/2009   EF 78%, NO ISCHEMIA   THYROID LOBECTOMY  11/27/2012   Procedure: THYROID LOBECTOMY;  Surgeon: Ralene Ok, MD;  Location: [facility];  Service: General;  Laterality: Right;  Right Thyroid Lobectomy   TONSILLECTOMY     US ECHOCARDIOGRAPHY  03/23/2006   EF 55-60%   Current Outpatient Medications on File Prior to Visit  Medication Sig Dispense Refill   Accu-Chek Softclix Lancets lancets USE AS DIRECTED TO CHECK SUGAR 100 each 0   allopurinol (ZYLOPRIM) 100 MG tablet TAKE 2 TABLETS BY MOUTH EVERY DAY 180 tablet 1   Blood Glucose Monitoring Suppl (ACCU-CHEK AVIVA PLUS) W/DEVICE KIT PT NEEDS METER-STRIPS(1 BOTTLE =100/5 REFILLS)-LANCETS(1 BOX/5 REFILLS) CHECKS BS QD DX: 250.00 1 kit 0   Calcium Carbonate (CALCIUM 500 PO) Take by mouth.     cetirizine (ZYRTEC) 10 MG tablet Take 1 tablet (10 mg total) by mouth daily. 30 tablet 11   Cholecalciferol (VITAMIN D-3) 125 MCG (5000 UT) TABS Take by mouth.     colchicine 0.6 MG tablet TAKE 1 TABLET  DAILY. TAKE 2 TABS IMMEDIATELY THEN 1 IN AN HOUR IF STILL HURTING 180 tablet 0   glucose blood (ACCU-CHEK AVIVA PLUS) test strip USE 2 (TWO) TIMES DAILY. CHECK BLOOD SUGAR TWICE DAILY FASTING IN MORNING, 2 HRS AFTER A MEAL E11.65 100 strip 3   levothyroxine (SYNTHROID) 88 MCG tablet Take 1 tablet (88 mcg total) by mouth daily. 90 tablet 3   lisinopril (ZESTRIL) 20 MG tablet Take 1 tablet (20 mg total) by mouth daily. 90 tablet 3   metFORMIN (GLUCOPHAGE) 500 MG tablet TAKE TWO TABLETS BY MOUTH TWICE A DAY WITH A MEAL 360 tablet 2   Multiple Vitamin (MULTIVITAMIN WITH MINERALS) TABS Take 1 tablet by mouth daily.     simvastatin (ZOCOR) 80 MG tablet TAKE 1/2 TABLET BY MOUTH EVERY NIGHT AT BEDTIME 45 tablet 2   sitaGLIPtin (JANUVIA) 100 MG tablet Take 1 tablet (100 mg total) by mouth daily. 90 tablet 2   vitamin B-12 (CYANOCOBALAMIN) 1000 MCG tablet Take 1,000 mcg by mouth daily.      vitamin E 180 MG (400 UNITS) capsule Take 400 Units by mouth daily.     No current facility-administered medications on file prior to visit.   Allergies  Allergen Reactions   Clindamycin/Lincomycin     CLINDAMYCIN TAKEN WITH CIPRO CAUSED SEVERE NAUSEA--PT STATES SHE CAN TAKE CIPRO ALONE WITHOUT PROBLEM--IT WAS JUST THE COMBINATION SHE DID NOT TOLERATE.   Levaquin [Levofloxacin] Nausea And Vomiting   Metronidazole Hcl Other (See Comments)    Deathly sick WHEN PT TOOK METRONIDAZOLE WITH CIPRO.  PT STATES SHE HAS TAKEN CIPRO ALONE WITHOUT PROBLEM --JUST DID NOT TOLERATE THE COMBINATION OF BOTH DRUGS TAKEN TOGETHER.   Social History   Socioeconomic History   Marital status: Married    Spouse name: Not on file   Number of children: Not on file   Years of education: Not on file   Highest education level: Not on file  Occupational History   Not on file  Tobacco Use   Smoking status: Never   Smokeless tobacco: Never  Substance and Sexual Activity   Alcohol use: No   Drug use: No   Sexual activity: Not on file  Other Topics Concern   Not on file  Social History Narrative   Not on file   Social Determinants of Health   Financial Resource Strain: Not on file  Food Insecurity: Not on file  Transportation Needs: Not on file  Physical Activity: Not on file  Stress: Not on file  Social Connections: Not on file  Intimate Partner Violence: Not on file   History reviewed. No pertinent family history.     Review of Systems  All other systems reviewed and are negative.     Objective:   Physical Exam Vitals reviewed.  Constitutional:      General: She is not in acute distress.    Appearance: She is well-developed. She is not diaphoretic.  HENT:     Right Ear: External ear normal.     Left Ear: External ear normal.     Nose: Nose normal.  Neck:     Thyroid: No thyromegaly.     Vascular: No JVD.  Cardiovascular:     Rate and Rhythm: Normal rate and regular rhythm.      Heart sounds: Normal heart sounds. No murmur heard. Pulmonary:     Effort: Pulmonary effort is normal. No respiratory distress.     Breath sounds: Normal breath sounds. No wheezing or rales.  Abdominal:  General: Bowel sounds are normal. There is no distension.     Palpations: Abdomen is soft. There is no mass.     Tenderness: There is no abdominal tenderness. There is no guarding or rebound.  Musculoskeletal:     Cervical back: Neck supple.     Right lower leg: No edema.     Left lower leg: No edema.  Skin:    Findings: No erythema or rash.  Neurological:     Mental Status: She is alert and oriented to person, place, and time.     Cranial Nerves: No cranial nerve deficit.     Motor: No abnormal muscle tone.     Coordination: Coordination normal.          Assessment & Plan:  Essential hypertension  DM (diabetes mellitus), type 2, uncontrolled, with hyperosmolarity (HCC) Discontinue Lasix and replace with hydrochlorothiazide.  I feel that they will do a better job controlling the blood pressure.  Add glipizide extended release 5 mg daily and await the results of the hemoglobin A1c that is still pending.  Fasting blood sugar goal is less than 130.  2-hour postprandial sugars are recommended to be less than 180.  Await the results of her response to the glipizide

## 2021-09-30 ENCOUNTER — Emergency Department (HOSPITAL_BASED_OUTPATIENT_CLINIC_OR_DEPARTMENT_OTHER)
Admission: EM | Admit: 2021-09-30 | Discharge: 2021-09-30 | Disposition: A | Discharge status: Home / Self Care | Payer: Medicare Other | Attending: Emergency Medicine | Admitting: Emergency Medicine

## 2021-09-30 ENCOUNTER — Other Ambulatory Visit: Payer: Self-pay | Admitting: Family Medicine

## 2021-09-30 ENCOUNTER — Telehealth: Payer: Self-pay

## 2021-09-30 DIAGNOSIS — I160 Hypertensive urgency: Secondary | ICD-10-CM

## 2021-09-30 LAB — CBC WITH DIFFERENTIAL/PLATELET
Abs Immature Granulocytes: 0.05 10*3/uL (ref 0.00–0.07)
Basophils Absolute: 0.1 10*3/uL (ref 0.0–0.1)
Basophils Relative: 1 %
Eosinophils Absolute: 0.1 10*3/uL (ref 0.0–0.5)
Eosinophils Relative: 1 %
HCT: 37.8 % (ref 36.0–46.0)
Hemoglobin: 12.5 g/dL (ref 12.0–15.0)
Immature Granulocytes: 0 %
Lymphocytes Relative: 20 %
Lymphs Abs: 2.8 10*3/uL (ref 0.7–4.0)
MCH: 30.4 pg (ref 26.0–34.0)
MCHC: 33.1 g/dL (ref 30.0–36.0)
MCV: 92 fL (ref 80.0–100.0)
Monocytes Absolute: 1 10*3/uL (ref 0.1–1.0)
Monocytes Relative: 7 %
Neutro Abs: 10.1 10*3/uL — ABNORMAL HIGH (ref 1.7–7.7)
Neutrophils Relative %: 71 %
Platelets: 326 10*3/uL (ref 150–400)
RBC: 4.11 MIL/uL (ref 3.87–5.11)
RDW: 13.3 % (ref 11.5–15.5)
WBC: 14.1 10*3/uL — ABNORMAL HIGH (ref 4.0–10.5)
nRBC: 0 % (ref 0.0–0.2)

## 2021-09-30 LAB — BASIC METABOLIC PANEL
Anion gap: 12 (ref 5–15)
BUN: 17 mg/dL (ref 8–23)
CO2: 26 mmol/L (ref 22–32)
Calcium: 10 mg/dL (ref 8.9–10.3)
Chloride: 97 mmol/L — ABNORMAL LOW (ref 98–111)
Creatinine, Ser: 1.23 mg/dL — ABNORMAL HIGH (ref 0.44–1.00)
GFR, Estimated: 45 mL/min — ABNORMAL LOW (ref >60)
Glucose, Bld: 218 mg/dL — ABNORMAL HIGH (ref 70–99)
Potassium: 3.7 mmol/L (ref 3.5–5.1)
Sodium: 135 mmol/L (ref 135–145)

## 2021-09-30 LAB — TROPONIN I (HIGH SENSITIVITY): Troponin I (High Sensitivity): 11 ng/L (ref <18)

## 2021-09-30 MED ORDER — AMLODIPINE BESYLATE 10 MG PO TABS: 10.0000 mg | ORAL_TABLET | Freq: Every day | ORAL | 3 refills | Status: DC

## 2021-09-30 MED ORDER — CLONIDINE HCL 0.1 MG PO TABS
0.1000 mg | ORAL_TABLET | Freq: Once | ORAL | Status: AC
  Administered 2021-09-30: 0.1 mg via ORAL
  Filled 2021-09-30: qty 1

## 2021-09-30 MED ORDER — CLONIDINE HCL 0.1 MG PO TABS: 0.2000 mg | ORAL_TABLET | Freq: Three times a day (TID) | ORAL | 0 refills | Status: DC | PRN

## 2021-09-30 NOTE — Discharge Instructions (Signed)
Continue medications as previously prescribed.  Begin taking clonidine as prescribed as needed for blood pressures sustained over 200.  Keep a list of your blood pressures at home and take this with you to your next doctor's appointment.  Ideally, this should be next week.  Return to the emergency department if you develop severe headache, chest pain, weakness/numbness, or other new and concerning symptoms.

## 2021-09-30 NOTE — Telephone Encounter (Signed)
Patient aware.

## 2021-09-30 NOTE — Telephone Encounter (Signed)
Pt called in stating that she went to the er yesterday after checking her bp and sugar yesterday. Pt states that she was instructed to get in touch with her pcp to see if there is another med that pcp may want her to try. Pt asks if nurse/dr would please give her a call back about this as pt does not want to end up in the emergency room again.  Please advise.  Cb#: [phone]

## 2021-09-30 NOTE — ED Notes (Signed)
Pt verbalizes understanding of discharge instructions. Opportunity for questioning and answers were provided. Pt discharged from ED to home.   ? ?

## 2021-09-30 NOTE — ED Provider Notes (Signed)
[facility] EMERGENCY DEPT Provider Note   CSN: [id number] Arrival date & time: 09/29/21  2018     History Chief Complaint  Patient presents with   Hypertension    Whitney Morales is a 80 y.o. female.  Patient is a 80 year old female with past medical history of hypertension.  She also has history of diabetes, hypothyroidism, hyperlipidemia, and GERD.  She presents today for evaluation of elevated blood pressure.  She tells me she had seen her primary doctor earlier this week due to elevated blood sugars.  Today she took her blood pressure at home and got a reading over 200.  She then went to the fire department and had her blood pressure checked.  She tells me it was 768 systolic, then was told to come to the ER to be evaluated.  She denies to me she is having any chest pain or difficulty breathing.  She denies any headache, numbness, or weakness.  She has no other complaints at present.  The history is provided by the patient.      Past Medical History:  Diagnosis Date   Abnormal finding on EKG 10/29/2012   FOLLOW UP NUCLEAR STRESS TEST ON 11/05/12 -NORMAL   Corneal erosion 2012   RIGHT EYE--HAS RESOLVED--NO PROBLEMS SINCE   Diabetes mellitus    Diverticulitis    Fatigue    GERD (gastroesophageal reflux disease)    PAST HX--NO PROBLEMS NOW-PT DOES TAKE DAILY TUMS AT BEDTIME   Goiter    cyst vs goiter on thyroid   H/O hiatal hernia    High cholesterol    Hypertension    Obesity    Osteopenia     Patient Active Problem List   Diagnosis Date Noted   Closed fracture of shaft of humerus 03/17/2021   Hypothyroidism 08/10/2014   Essential hypertension, benign 08/10/2014   Hyperlipidemia 08/10/2014   Diabetes mellitus without complication ([place]) 11/57/2620   Abnormal ECG 11/01/2012    Past Surgical History:  Procedure Laterality Date   ABDOMINAL HYSTERECTOMY     BREAST LUMPECTOMY     BENIGN   CARDIOVASCULAR STRESS TEST  06/08/2009   EF 78%, NO ISCHEMIA    THYROID LOBECTOMY  11/27/2012   Procedure: THYROID LOBECTOMY;  Surgeon: Ralene Ok, MD;  Location: [facility];  Service: General;  Laterality: Right;  Right Thyroid Lobectomy   TONSILLECTOMY     US ECHOCARDIOGRAPHY  03/23/2006   EF 55-60%     OB History   No obstetric history on file.     No family history on file.  Social History   Tobacco Use   Smoking status: Never   Smokeless tobacco: Never  Substance Use Topics   Alcohol use: No   Drug use: No    Home Medications Prior to Admission medications   Medication Sig Start Date End Date Taking? Authorizing Provider  Accu-Chek Softclix Lancets lancets USE AS DIRECTED TO CHECK SUGAR 07/03/19  Yes Susy Frizzle, MD  allopurinol (ZYLOPRIM) 100 MG tablet TAKE 2 TABLETS BY MOUTH EVERY DAY 04/28/21  Yes Susy Frizzle, MD  Blood Glucose Monitoring Suppl (ACCU-CHEK AVIVA PLUS) W/DEVICE KIT PT NEEDS METER-STRIPS(1 BOTTLE =100/5 REFILLS)-LANCETS(1 BOX/5 REFILLS) CHECKS BS QD DX: 250.00 11/20/14  Yes Susy Frizzle, MD  cetirizine (ZYRTEC) 10 MG tablet Take 1 tablet (10 mg total) by mouth daily. 08/02/20  Yes Susy Frizzle, MD  Cholecalciferol (VITAMIN D-3) 125 MCG (5000 UT) TABS Take by mouth.   Yes [provider]  colchicine  0.6 MG tablet TAKE 1 TABLET DAILY. TAKE 2 TABS IMMEDIATELY THEN 1 IN AN HOUR IF STILL HURTING 01/21/21  Yes Susy Frizzle, MD  glucose blood (ACCU-CHEK AVIVA PLUS) test strip USE 2 (TWO) TIMES DAILY. CHECK BLOOD SUGAR TWICE DAILY FASTING IN MORNING, 2 HRS AFTER A MEAL E11.65 12/20/20  Yes Susy Frizzle, MD  levothyroxine (SYNTHROID) 88 MCG tablet Take 1 tablet (88 mcg total) by mouth daily. 06/24/21  Yes Susy Frizzle, MD  lisinopril (ZESTRIL) 20 MG tablet Take 1 tablet (20 mg total) by mouth daily. 05/02/21  Yes Susy Frizzle, MD  metFORMIN (GLUCOPHAGE) 500 MG tablet TAKE TWO TABLETS BY MOUTH TWICE A DAY WITH A MEAL 08/26/21  Yes Susy Frizzle, MD  Multiple Vitamin (MULTIVITAMIN WITH  MINERALS) TABS Take 1 tablet by mouth daily.   Yes [provider]  simvastatin (ZOCOR) 80 MG tablet TAKE 1/2 TABLET BY MOUTH EVERY NIGHT AT BEDTIME 03/08/21  Yes Susy Frizzle, MD  sitaGLIPtin (JANUVIA) 100 MG tablet Take 1 tablet (100 mg total) by mouth daily. 03/15/21  Yes Susy Frizzle, MD  vitamin B-12 (CYANOCOBALAMIN) 1000 MCG tablet Take 1,000 mcg by mouth daily.   Yes [provider]  vitamin E 180 MG (400 UNITS) capsule Take 400 Units by mouth daily.   Yes [provider]  Calcium Carbonate (CALCIUM 500 PO) Take by mouth.    [provider]  glipiZIDE (GLUCOTROL XL) 5 MG 24 hr tablet Take 1 tablet (5 mg total) by mouth daily with breakfast. 09/29/21   Susy Frizzle, MD  hydrochlorothiazide (HYDRODIURIL) 25 MG tablet Take 1 tablet (25 mg total) by mouth daily. 09/29/21   Susy Frizzle, MD    Allergies    Clindamycin/lincomycin, Levaquin [levofloxacin], and Metronidazole hcl  Review of Systems   Review of Systems  All other systems reviewed and are negative.  Physical Exam Updated Vital Signs BP (!) 186/66   Pulse 94   Temp 98.1 F (36.7 C) (Oral)   Resp 14   Ht '5\' 3"'  (1.6 m)   Wt 107.5 kg   SpO2 100%   BMI 41.98 kg/m   Physical Exam Vitals and nursing note reviewed.  Constitutional:      General: She is not in acute distress.    Appearance: She is well-developed. She is not diaphoretic.  HENT:     Head: Normocephalic and atraumatic.  Cardiovascular:     Rate and Rhythm: Normal rate and regular rhythm.     Heart sounds: No murmur heard.   No friction rub. No gallop.  Pulmonary:     Effort: Pulmonary effort is normal. No respiratory distress.     Breath sounds: Normal breath sounds. No wheezing.  Abdominal:     General: Bowel sounds are normal. There is no distension.     Palpations: Abdomen is soft.     Tenderness: There is no abdominal tenderness.  Musculoskeletal:        General: Normal range of motion.      Cervical back: Normal range of motion and neck supple.  Skin:    General: Skin is warm and dry.  Neurological:     General: No focal deficit present.     Mental Status: She is alert and oriented to person, place, and time.     Cranial Nerves: No cranial nerve deficit.     Sensory: No sensory deficit.     Motor: No weakness.     Coordination: Coordination  normal.    ED Results / Procedures / Treatments   Labs (all labs ordered are listed, but only abnormal results are displayed) Labs Reviewed  BASIC METABOLIC PANEL  CBC WITH DIFFERENTIAL/PLATELET  TROPONIN I (HIGH SENSITIVITY)    EKG EKG Interpretation  Date/Time:  Friday September 30 2021 01:06:14 EST Ventricular Rate:  98 PR Interval:  184 QRS Duration: 94 QT Interval:  345 QTC Calculation: 441 R Axis:   -5 Text Interpretation: Sinus rhythm Low voltage, precordial leads Anteroseptal infarct, old Unchanged from 11/05/2012 Confirmed by Veryl Speak [phone]) on 09/30/2021 1:46:31 AM  Radiology No results found.  Procedures Procedures   Medications Ordered in ED Medications  cloNIDine (CATAPRES) tablet 0.1 mg (has no administration in time range)    ED Course  I have reviewed the triage vital signs and the nursing notes.  Pertinent labs & imaging results that were available during my care of the patient were reviewed by me and considered in my medical decision making (see chart for details).    MDM Rules/Calculators/A&P  Patient presenting here with complaints of elevated blood pressure, the events of which are described in the HPI.  She is otherwise asymptomatic and neurologically intact.  Laboratory studies and physical examination do not suggest any evidence of endorgan damage.  Patient did receive a dose of clonidine here in the ER and blood pressure is improving.  Her blood pressure is now 835 systolic.  Patient seems appropriate for discharge.  I have advised her to keep a record of her blood pressures at home  and follow-up these results with her primary doctor.  Final Clinical Impression(s) / ED Diagnoses Final diagnoses:  None    Rx / DC Orders ED Discharge Orders     None        Veryl Speak, MD 09/30/21 [phone]

## 2021-10-04 ENCOUNTER — Other Ambulatory Visit: Payer: Self-pay

## 2021-10-04 MED ORDER — ACCU-CHEK SOFTCLIX LANCETS MISC: 3 refills | Status: DC

## 2021-10-04 MED ORDER — ACCU-CHEK SOFTCLIX LANCETS MISC: 0 refills | Status: DC

## 2021-10-06 ENCOUNTER — Ambulatory Visit: Payer: Medicare Other | Admitting: Family Medicine

## 2021-10-06 ENCOUNTER — Encounter: Payer: Self-pay | Admitting: Family Medicine

## 2021-10-06 ENCOUNTER — Other Ambulatory Visit: Payer: Self-pay

## 2021-10-06 VITALS — BP 162/68 | HR 98 | Resp 18 | Ht 63.0 in | Wt 226.0 lb

## 2021-10-06 DIAGNOSIS — I10 Essential (primary) hypertension: Secondary | ICD-10-CM

## 2021-10-06 NOTE — Progress Notes (Signed)
Subjective:    Patient ID: Whitney Morales, female    DOB: 1941-08-20, 80 y.o.   MRN: [id number] Please see my last office visit.  At that time, the patient's blood pressure was elevated and I recommended switching from furosemide to hydrochlorothiazide to try to lower her blood pressure more effectively.  Patient mistakingly also discontinued lisinopril.  This was in addition to furosemide.  The next day her blood pressure was high which made her anxious.  She started checking her blood pressure more frequently and eventually her blood pressure was above 628 systolic.  She panicked and went to a fire department where her systolic blood pressure was over 240 prompting her to go to the emergency room.  Her blood pressure improved in the emergency room after receiving clonidine.  She called me the next day and we started her on amlodipine 10 mg a day.  I was unaware that she had stopped lisinopril.  Now she is on amlodipine 10 mg a day and hydrochlorothiazide 25 mg a day but she has not been taking lisinopril 20 mg a day.  Her blood pressures are ranging between 315 and 176 systolic.  However I want to emphasize that she is only been on the amlodipine and the hydrochlorothiazide 6 days.  She is also been off lisinopril now for 6 days.  Past Medical History:  Diagnosis Date   Abnormal finding on EKG 10/29/2012   FOLLOW UP NUCLEAR STRESS TEST ON 11/05/12 -NORMAL   Corneal erosion 2012   RIGHT EYE--HAS RESOLVED--NO PROBLEMS SINCE   Diabetes mellitus    Diverticulitis    Fatigue    GERD (gastroesophageal reflux disease)    PAST HX--NO PROBLEMS NOW-PT DOES TAKE DAILY TUMS AT BEDTIME   Goiter    cyst vs goiter on thyroid   H/O hiatal hernia    High cholesterol    Hypertension    Obesity    Osteopenia    Past Surgical History:  Procedure Laterality Date   ABDOMINAL HYSTERECTOMY     BREAST LUMPECTOMY     BENIGN   CARDIOVASCULAR STRESS TEST  06/08/2009   EF 78%, NO ISCHEMIA   THYROID LOBECTOMY   11/27/2012   Procedure: THYROID LOBECTOMY;  Surgeon: Ralene Ok, MD;  Location: [facility];  Service: General;  Laterality: Right;  Right Thyroid Lobectomy   TONSILLECTOMY     US ECHOCARDIOGRAPHY  03/23/2006   EF 55-60%   Current Outpatient Medications on File Prior to Visit  Medication Sig Dispense Refill   Accu-Chek Softclix Lancets lancets USE AS DIRECTED TO CHECK SUGAR 200 each 3   allopurinol (ZYLOPRIM) 100 MG tablet TAKE 2 TABLETS BY MOUTH EVERY DAY 180 tablet 1   amLODipine (NORVASC) 10 MG tablet Take 1 tablet (10 mg total) by mouth daily. 90 tablet 3   Blood Glucose Monitoring Suppl (ACCU-CHEK AVIVA PLUS) W/DEVICE KIT PT NEEDS METER-STRIPS(1 BOTTLE =100/5 REFILLS)-LANCETS(1 BOX/5 REFILLS) CHECKS BS QD DX: 250.00 1 kit 0   Calcium Carbonate (CALCIUM 500 PO) Take by mouth.     cetirizine (ZYRTEC) 10 MG tablet Take 1 tablet (10 mg total) by mouth daily. 30 tablet 11   Cholecalciferol (VITAMIN D-3) 125 MCG (5000 UT) TABS Take by mouth.     cloNIDine (CATAPRES) 0.1 MG tablet Take 2 tablets (0.2 mg total) by mouth 3 (three) times daily as needed. 15 tablet 0   colchicine 0.6 MG tablet TAKE 1 TABLET DAILY. TAKE 2 TABS IMMEDIATELY THEN 1 IN AN HOUR IF STILL HURTING  180 tablet 0   glipiZIDE (GLUCOTROL XL) 5 MG 24 hr tablet Take 1 tablet (5 mg total) by mouth daily with breakfast. 90 tablet 1   glucose blood (ACCU-CHEK AVIVA PLUS) test strip USE 2 (TWO) TIMES DAILY. CHECK BLOOD SUGAR TWICE DAILY FASTING IN MORNING, 2 HRS AFTER A MEAL E11.65 100 strip 3   hydrochlorothiazide (HYDRODIURIL) 25 MG tablet Take 1 tablet (25 mg total) by mouth daily. 90 tablet 3   levothyroxine (SYNTHROID) 88 MCG tablet Take 1 tablet (88 mcg total) by mouth daily. 90 tablet 3   lisinopril (ZESTRIL) 20 MG tablet Take 1 tablet (20 mg total) by mouth daily. 90 tablet 3   metFORMIN (GLUCOPHAGE) 500 MG tablet TAKE TWO TABLETS BY MOUTH TWICE A DAY WITH A MEAL 360 tablet 2   Multiple Vitamin (MULTIVITAMIN WITH MINERALS) TABS  Take 1 tablet by mouth daily.     simvastatin (ZOCOR) 80 MG tablet TAKE 1/2 TABLET BY MOUTH EVERY NIGHT AT BEDTIME 45 tablet 2   sitaGLIPtin (JANUVIA) 100 MG tablet Take 1 tablet (100 mg total) by mouth daily. 90 tablet 2   vitamin B-12 (CYANOCOBALAMIN) 1000 MCG tablet Take 1,000 mcg by mouth daily.     vitamin E 180 MG (400 UNITS) capsule Take 400 Units by mouth daily.     No current facility-administered medications on file prior to visit.   Allergies  Allergen Reactions   Clindamycin/Lincomycin     CLINDAMYCIN TAKEN WITH CIPRO CAUSED SEVERE NAUSEA--PT STATES SHE CAN TAKE CIPRO ALONE WITHOUT PROBLEM--IT WAS JUST THE COMBINATION SHE DID NOT TOLERATE.   Levaquin [Levofloxacin] Nausea And Vomiting   Metronidazole Hcl Other (See Comments)    Deathly sick WHEN PT TOOK METRONIDAZOLE WITH CIPRO.  PT STATES SHE HAS TAKEN CIPRO ALONE WITHOUT PROBLEM --JUST DID NOT TOLERATE THE COMBINATION OF BOTH DRUGS TAKEN TOGETHER.   Social History   Socioeconomic History   Marital status: Married    Spouse name: Not on file   Number of children: Not on file   Years of education: Not on file   Highest education level: Not on file  Occupational History   Not on file  Tobacco Use   Smoking status: Never   Smokeless tobacco: Never  Substance and Sexual Activity   Alcohol use: No   Drug use: No   Sexual activity: Not on file  Other Topics Concern   Not on file  Social History Narrative   Not on file   Social Determinants of Health   Financial Resource Strain: Not on file  Food Insecurity: Not on file  Transportation Needs: Not on file  Physical Activity: Not on file  Stress: Not on file  Social Connections: Not on file  Intimate Partner Violence: Not on file   History reviewed. No pertinent family history.     Review of Systems  All other systems reviewed and are negative.     Objective:   Physical Exam Vitals reviewed.  Constitutional:      General: She is not in acute  distress.    Appearance: She is well-developed. She is not diaphoretic.  HENT:     Right Ear: External ear normal.     Left Ear: External ear normal.     Nose: Nose normal.  Neck:     Thyroid: No thyromegaly.     Vascular: No JVD.  Cardiovascular:     Rate and Rhythm: Normal rate and regular rhythm.     Heart sounds: Normal heart sounds. No  murmur heard. Pulmonary:     Effort: Pulmonary effort is normal. No respiratory distress.     Breath sounds: Normal breath sounds. No wheezing or rales.  Abdominal:     General: Bowel sounds are normal. There is no distension.     Palpations: Abdomen is soft. There is no mass.     Tenderness: There is no abdominal tenderness. There is no guarding or rebound.  Musculoskeletal:     Cervical back: Neck supple.     Right lower leg: No edema.     Left lower leg: No edema.  Skin:    Findings: No erythema or rash.  Neurological:     Mental Status: She is alert and oriented to person, place, and time.     Cranial Nerves: No cranial nerve deficit.     Motor: No abnormal muscle tone.     Coordination: Coordination normal.          Assessment & Plan:  Essential hypertension Resume lisinopril 20 mg a day and continue amlodipine 10 mg a day with hydrochlorothiazide 25 mg a day.  Recheck blood pressure in 1 week.  I believe this combination will be sufficient to lower her systolic blood pressure less than 140.  Her blood sugars are also all below 170 and have improved since starting the medication.

## 2021-10-22 ENCOUNTER — Other Ambulatory Visit: Payer: Self-pay | Admitting: Family Medicine

## 2021-11-22 ENCOUNTER — Other Ambulatory Visit: Payer: Self-pay

## 2021-11-22 ENCOUNTER — Encounter: Payer: Self-pay | Admitting: Family Medicine

## 2021-11-22 ENCOUNTER — Ambulatory Visit: Payer: Medicare Other | Admitting: Family Medicine

## 2021-11-22 VITALS — BP 148/72 | HR 92 | Temp 97.6°F | Resp 18 | Ht 63.0 in | Wt 222.0 lb

## 2021-11-22 DIAGNOSIS — I10 Essential (primary) hypertension: Secondary | ICD-10-CM

## 2021-11-22 MED ORDER — LISINOPRIL 20 MG PO TABS: 20.0000 mg | ORAL_TABLET | Freq: Two times a day (BID) | ORAL | 3 refills | Status: DC

## 2021-11-22 NOTE — Progress Notes (Signed)
Subjective:    Patient ID: Whitney Morales, female    DOB: 10-12-1941, 81 y.o.   MRN: [id number] 10/06/21  Please see my last office visit.  At that time, the patient's blood pressure was elevated and I recommended switching from furosemide to hydrochlorothiazide to try to lower her blood pressure more effectively.  Patient mistakingly also discontinued lisinopril.  This was in addition to furosemide.  The next day her blood pressure was high which made her anxious.  She started checking her blood pressure more frequently and eventually her blood pressure was above 836 systolic.  She panicked and went to a fire department where her systolic blood pressure was over 240 prompting her to go to the emergency room.  Her blood pressure improved in the emergency room after receiving clonidine.  She called me the next day and we started her on amlodipine 10 mg a day.  I was unaware that she had stopped lisinopril.  Now she is on amlodipine 10 mg a day and hydrochlorothiazide 25 mg a day but she has not been taking lisinopril 20 mg a day.  Her blood pressures are ranging between 629 and 476 systolic.  However I want to emphasize that she is only been on the amlodipine and the hydrochlorothiazide 6 days.  She is also been off lisinopril now for 6 days.  At that time, my plan was:  Resume lisinopril 20 mg a day and continue amlodipine 10 mg a day with hydrochlorothiazide 25 mg a day.  Recheck blood pressure in 1 week.  I believe this combination will be sufficient to lower her systolic blood pressure less than 140.  Her blood sugars are also all below 170 and have improved since starting the medication.  11/22/21 Since resuming amlodipine, the patient has developed swelling in her legs distal to her knees.  She has +1 pitting edema in both legs today.  She wants to go back on Lasix.  However blood pressure still not well controlled.  Her systolic blood pressure today is 148.  She been averaging around 145-150 at  home.  Past Medical History:  Diagnosis Date   Abnormal finding on EKG 10/29/2012   FOLLOW UP NUCLEAR STRESS TEST ON 11/05/12 -NORMAL   Corneal erosion 2012   RIGHT EYE--HAS RESOLVED--NO PROBLEMS SINCE   Diabetes mellitus    Diverticulitis    Fatigue    GERD (gastroesophageal reflux disease)    PAST HX--NO PROBLEMS NOW-PT DOES TAKE DAILY TUMS AT BEDTIME   Goiter    cyst vs goiter on thyroid   H/O hiatal hernia    High cholesterol    Hypertension    Obesity    Osteopenia    Past Surgical History:  Procedure Laterality Date   ABDOMINAL HYSTERECTOMY     BREAST LUMPECTOMY     BENIGN   CARDIOVASCULAR STRESS TEST  06/08/2009   EF 78%, NO ISCHEMIA   THYROID LOBECTOMY  11/27/2012   Procedure: THYROID LOBECTOMY;  Surgeon: Ralene Ok, MD;  Location: [facility];  Service: General;  Laterality: Right;  Right Thyroid Lobectomy   TONSILLECTOMY     US ECHOCARDIOGRAPHY  03/23/2006   EF 55-60%   Current Outpatient Medications on File Prior to Visit  Medication Sig Dispense Refill   Accu-Chek Softclix Lancets lancets USE AS DIRECTED TO CHECK SUGAR 200 each 3   allopurinol (ZYLOPRIM) 100 MG tablet TAKE 2 TABLETS BY MOUTH EVERY DAY 180 tablet 1   amLODipine (NORVASC) 10 MG tablet Take 1 tablet (  10 mg total) by mouth daily. 90 tablet 3   Blood Glucose Monitoring Suppl (ACCU-CHEK AVIVA PLUS) W/DEVICE KIT PT NEEDS METER-STRIPS(1 BOTTLE =100/5 REFILLS)-LANCETS(1 BOX/5 REFILLS) CHECKS BS QD DX: 250.00 1 kit 0   Calcium Carbonate (CALCIUM 500 PO) Take by mouth.     cetirizine (ZYRTEC) 10 MG tablet Take 1 tablet (10 mg total) by mouth daily. 30 tablet 11   Cholecalciferol (VITAMIN D-3) 125 MCG (5000 UT) TABS Take by mouth.     cloNIDine (CATAPRES) 0.1 MG tablet Take 2 tablets (0.2 mg total) by mouth 3 (three) times daily as needed. 15 tablet 0   colchicine 0.6 MG tablet TAKE 1 TABLET DAILY. TAKE 2 TABS IMMEDIATELY THEN 1 IN AN HOUR IF STILL HURTING 180 tablet 0   glipiZIDE (GLUCOTROL XL) 5 MG 24 hr tablet  Take 1 tablet (5 mg total) by mouth daily with breakfast. 90 tablet 1   glucose blood (ACCU-CHEK AVIVA PLUS) test strip USE 2 (TWO) TIMES DAILY. CHECK BLOOD SUGAR TWICE DAILY FASTING IN MORNING, 2 HRS AFTER A MEAL E11.65 100 strip 3   hydrochlorothiazide (HYDRODIURIL) 25 MG tablet Take 1 tablet (25 mg total) by mouth daily. 90 tablet 3   levothyroxine (SYNTHROID) 88 MCG tablet Take 1 tablet (88 mcg total) by mouth daily. 90 tablet 3   metFORMIN (GLUCOPHAGE) 500 MG tablet TAKE TWO TABLETS BY MOUTH TWICE A DAY WITH A MEAL 360 tablet 2   Multiple Vitamin (MULTIVITAMIN WITH MINERALS) TABS Take 1 tablet by mouth daily.     simvastatin (ZOCOR) 80 MG tablet TAKE 1/2 TABLET BY MOUTH EVERY NIGHT AT BEDTIME 45 tablet 2   sitaGLIPtin (JANUVIA) 100 MG tablet Take 1 tablet (100 mg total) by mouth daily. 90 tablet 2   vitamin B-12 (CYANOCOBALAMIN) 1000 MCG tablet Take 1,000 mcg by mouth daily.     vitamin E 180 MG (400 UNITS) capsule Take 400 Units by mouth daily.     No current facility-administered medications on file prior to visit.   Allergies  Allergen Reactions   Clindamycin/Lincomycin     CLINDAMYCIN TAKEN WITH CIPRO CAUSED SEVERE NAUSEA--PT STATES SHE CAN TAKE CIPRO ALONE WITHOUT PROBLEM--IT WAS JUST THE COMBINATION SHE DID NOT TOLERATE.   Levaquin [Levofloxacin] Nausea And Vomiting   Metronidazole Hcl Other (See Comments)    Deathly sick WHEN PT TOOK METRONIDAZOLE WITH CIPRO.  PT STATES SHE HAS TAKEN CIPRO ALONE WITHOUT PROBLEM --JUST DID NOT TOLERATE THE COMBINATION OF BOTH DRUGS TAKEN TOGETHER.   Social History   Socioeconomic History   Marital status: Married    Spouse name: Not on file   Number of children: Not on file   Years of education: Not on file   Highest education level: Not on file  Occupational History   Not on file  Tobacco Use   Smoking status: Never   Smokeless tobacco: Never  Substance and Sexual Activity   Alcohol use: No   Drug use: No   Sexual activity: Not on  file  Other Topics Concern   Not on file  Social History Narrative   Not on file   Social Determinants of Health   Financial Resource Strain: Not on file  Food Insecurity: Not on file  Transportation Needs: Not on file  Physical Activity: Not on file  Stress: Not on file  Social Connections: Not on file  Intimate Partner Violence: Not on file   History reviewed. No pertinent family history.     Review of Systems  All  other systems reviewed and are negative.     Objective:   Physical Exam Vitals reviewed.  Constitutional:      General: She is not in acute distress.    Appearance: She is well-developed. She is not diaphoretic.  HENT:     Right Ear: External ear normal.     Left Ear: External ear normal.     Nose: Nose normal.  Neck:     Thyroid: No thyromegaly.     Vascular: No JVD.  Cardiovascular:     Rate and Rhythm: Normal rate and regular rhythm.     Heart sounds: Normal heart sounds. No murmur heard. Pulmonary:     Effort: Pulmonary effort is normal. No respiratory distress.     Breath sounds: Normal breath sounds. No wheezing or rales.  Abdominal:     General: Bowel sounds are normal. There is no distension.     Palpations: Abdomen is soft. There is no mass.     Tenderness: There is no abdominal tenderness. There is no guarding or rebound.  Musculoskeletal:     Cervical back: Neck supple.     Right lower leg: No edema.     Left lower leg: No edema.  Skin:    Findings: No erythema or rash.  Neurological:     Mental Status: She is alert and oriented to person, place, and time.     Cranial Nerves: No cranial nerve deficit.     Motor: No abnormal muscle tone.     Coordination: Coordination normal.          Assessment & Plan:  Essential hypertension Patient would like to try alternating hydrochlorothiazide with Lasix 40 mg a day.  I am fine doing this but I am concerned that it may get confusing for her.  She states that she prefers to take the Lasix  40 mg daily however on the days that she has to travel she will just take the hydrochlorothiazide instead of the Lasix.  I feel that the Lasix will not provide enough blood pressure control but also recommended increasing lisinopril to 40 mg a day.  Recheck in March.

## 2021-11-29 ENCOUNTER — Other Ambulatory Visit: Payer: Self-pay | Admitting: Family Medicine

## 2021-11-29 ENCOUNTER — Telehealth: Payer: Self-pay | Admitting: Family Medicine

## 2021-11-29 NOTE — Telephone Encounter (Signed)
Left message for patient to call back and schedule Medicare Annual Wellness Visit (AWV) in office.   If not able to come in office, please offer to do virtually or by telephone.  Left office number and my jabber [phone].  Last AWV:08/17/2014  Please schedule at anytime with Nurse Health Advisor.

## 2021-12-01 NOTE — Telephone Encounter (Signed)
error 

## 2021-12-15 ENCOUNTER — Telehealth: Payer: Self-pay | Admitting: Family Medicine

## 2021-12-15 NOTE — Telephone Encounter (Signed)
Left message for patient to call back and schedule Medicare Annual Wellness Visit (AWV) in office.   If not able to come in office, please offer to do virtually or by telephone.  Left office number and my jabber [phone].  Last AWV:08/17/2014  Please schedule at anytime with Nurse Health Advisor.

## 2022-01-10 ENCOUNTER — Other Ambulatory Visit: Payer: Self-pay | Admitting: Family Medicine

## 2022-01-10 ENCOUNTER — Other Ambulatory Visit: Payer: Self-pay

## 2022-01-10 ENCOUNTER — Other Ambulatory Visit: Payer: Medicare Other

## 2022-01-10 DIAGNOSIS — I10 Essential (primary) hypertension: Secondary | ICD-10-CM

## 2022-01-10 DIAGNOSIS — E1165 Type 2 diabetes mellitus with hyperglycemia: Secondary | ICD-10-CM

## 2022-01-12 ENCOUNTER — Other Ambulatory Visit: Payer: Self-pay

## 2022-01-12 ENCOUNTER — Encounter (INDEPENDENT_AMBULATORY_CARE_PROVIDER_SITE_OTHER): Payer: Medicare Other | Admitting: Family Medicine

## 2022-01-12 ENCOUNTER — Ambulatory Visit (INDEPENDENT_AMBULATORY_CARE_PROVIDER_SITE_OTHER): Payer: Medicare Other

## 2022-01-12 VITALS — Ht 63.0 in | Wt 227.8 lb

## 2022-01-12 DIAGNOSIS — Z13820 Encounter for screening for osteoporosis: Secondary | ICD-10-CM | POA: Diagnosis not present

## 2022-01-12 DIAGNOSIS — Z780 Asymptomatic menopausal state: Secondary | ICD-10-CM | POA: Diagnosis not present

## 2022-01-12 DIAGNOSIS — Z0000 Encounter for general adult medical examination without abnormal findings: Secondary | ICD-10-CM

## 2022-01-12 NOTE — Patient Instructions (Addendum)
Whitney Morales , ?Thank you for taking time to come for your Medicare Wellness Visit. I appreciate your ongoing commitment to your health goals. Please review the following plan we discussed and let me know if I can assist you in the future.  ? ?Screening recommendations/referrals: ?Colonoscopy: Done 08/10/2017 No longer required. ? ?Mammogram: Done 12/07/2021 Repeat annually ? ?Bone Density: Done 01/07/2018 Repeat every 2 years ? ?Recommended yearly ophthalmology/optometry visit for glaucoma screening and checkup ?Recommended yearly dental visit for hygiene and checkup ? ?Vaccinations: ?Influenza vaccine: 07/23/2021 Repeat annually ? ?Pneumococcal vaccine:Done 09/15/2013, 08/08/2016, 08/25/2005 ?Tdap vaccine: Due Repeat in 10 years ? ?Shingles vaccine: Done 04/24/2011   ?Covid-19:Done 11/20/2019, 12/12/2019 ? ?Advanced directives: Please bring a copy of your health care power of attorney and living will to the office to be added to your chart at your convenience. ? ? ?Conditions/risks identified: Aim for 30 minutes of exercise or brisk walking, 6-8 glasses of water, and 5 servings of fruits and vegetables each day. ? ? ?Next appointment: Follow up in one year for your annual wellness visit 2024. ? ? ?Preventive Care 81 Years and Older, Female ?Preventive care refers to lifestyle choices and visits with your health care provider that can promote health and wellness. ?What does preventive care include? ?A yearly physical exam. This is also called an annual well check. ?Dental exams once or twice a year. ?Routine eye exams. Ask your health care provider how often you should have your eyes checked. ?Personal lifestyle choices, including: ?Daily care of your teeth and gums. ?Regular physical activity. ?Eating a healthy diet. ?Avoiding tobacco and drug use. ?Limiting alcohol use. ?Practicing safe sex. ?Taking low-dose aspirin every day. ?Taking vitamin and mineral supplements as recommended by your health care provider. ?What  happens during an annual well check? ?The services and screenings done by your health care provider during your annual well check will depend on your age, overall health, lifestyle risk factors, and family history of disease. ?Counseling  ?Your health care provider may ask you questions about your: ?Alcohol use. ?Tobacco use. ?Drug use. ?Emotional well-being. ?Home and relationship well-being. ?Sexual activity. ?Eating habits. ?History of falls. ?Memory and ability to understand (cognition). ?Work and work Statistician. ?Reproductive health. ?Screening  ?You may have the following tests or measurements: ?Height, weight, and BMI. ?Blood pressure. ?Lipid and cholesterol levels. These may be checked every 5 years, or more frequently if you are over 56 years old. ?Skin check. ?Lung cancer screening. You may have this screening every year starting at age 76 if you have a 30-pack-year history of smoking and currently smoke or have quit within the past 15 years. ?Fecal occult blood test (FOBT) of the stool. You may have this test every year starting at age 40. ?Flexible sigmoidoscopy or colonoscopy. You may have a sigmoidoscopy every 5 years or a colonoscopy every 10 years starting at age 97. ?Hepatitis C blood test. ?Hepatitis B blood test. ?Sexually transmitted disease (STD) testing. ?Diabetes screening. This is done by checking your blood sugar (glucose) after you have not eaten for a while (fasting). You may have this done every 1-3 years. ?Bone density scan. This is done to screen for osteoporosis. You may have this done starting at age 28. ?Mammogram. This may be done every 1-2 years. Talk to your health care provider about how often you should have regular mammograms. ?Talk with your health care provider about your test results, treatment options, and if necessary, the need for more tests. ?Vaccines  ?Your health  care provider may recommend certain vaccines, such as: ?Influenza vaccine. This is recommended every  year. ?Tetanus, diphtheria, and acellular pertussis (Tdap, Td) vaccine. You may need a Td booster every 10 years. ?Zoster vaccine. You may need this after age 64. ?Pneumococcal 13-valent conjugate (PCV13) vaccine. One dose is recommended after age 34. ?Pneumococcal polysaccharide (PPSV23) vaccine. One dose is recommended after age 66. ?Talk to your health care provider about which screenings and vaccines you need and how often you need them. ?This information is not intended to replace advice given to you by your health care provider. Make sure you discuss any questions you have with your health care provider. ?Document Released: 11/05/2015 Document Revised: 06/28/2016 Document Reviewed: 08/10/2015 ?Elsevier Interactive Patient Education ? 2017 [place]. ? ?Fall Prevention in the Home ?Falls can cause injuries. They can happen to people of all ages. There are many things you can do to make your home safe and to help prevent falls. ?What can I do on the outside of my home? ?Regularly fix the edges of walkways and driveways and fix any cracks. ?Remove anything that might make you trip as you walk through a door, such as a raised step or threshold. ?Trim any bushes or trees on the path to your home. ?Use bright outdoor lighting. ?Clear any walking paths of anything that might make someone trip, such as rocks or tools. ?Regularly check to see if handrails are loose or broken. Make sure that both sides of any steps have handrails. ?Any raised decks and porches should have guardrails on the edges. ?Have any leaves, snow, or ice cleared regularly. ?Use sand or salt on walking paths during winter. ?Clean up any spills in your garage right away. This includes oil or grease spills. ?What can I do in the bathroom? ?Use night lights. ?Install grab bars by the toilet and in the tub and shower. Do not use towel bars as grab bars. ?Use non-skid mats or decals in the tub or shower. ?If you need to sit down in the shower, use a  plastic, non-slip stool. ?Keep the floor dry. Clean up any water that spills on the floor as soon as it happens. ?Remove soap buildup in the tub or shower regularly. ?Attach bath mats securely with double-sided non-slip rug tape. ?Do not have throw rugs and other things on the floor that can make you trip. ?What can I do in the bedroom? ?Use night lights. ?Make sure that you have a light by your bed that is easy to reach. ?Do not use any sheets or blankets that are too big for your bed. They should not hang down onto the floor. ?Have a firm chair that has side arms. You can use this for support while you get dressed. ?Do not have throw rugs and other things on the floor that can make you trip. ?What can I do in the kitchen? ?Clean up any spills right away. ?Avoid walking on wet floors. ?Keep items that you use a lot in easy-to-reach places. ?If you need to reach something above you, use a strong step stool that has a grab bar. ?Keep electrical cords out of the way. ?Do not use floor polish or wax that makes floors slippery. If you must use wax, use non-skid floor wax. ?Do not have throw rugs and other things on the floor that can make you trip. ?What can I do with my stairs? ?Do not leave any items on the stairs. ?Make sure that there are handrails on  both sides of the stairs and use them. Fix handrails that are broken or loose. Make sure that handrails are as long as the stairways. ?Check any carpeting to make sure that it is firmly attached to the stairs. Fix any carpet that is loose or worn. ?Avoid having throw rugs at the top or bottom of the stairs. If you do have throw rugs, attach them to the floor with carpet tape. ?Make sure that you have a light switch at the top of the stairs and the bottom of the stairs. If you do not have them, ask someone to add them for you. ?What else can I do to help prevent falls? ?Wear shoes that: ?Do not have high heels. ?Have rubber bottoms. ?Are comfortable and fit you  well. ?Are closed at the toe. Do not wear sandals. ?If you use a stepladder: ?Make sure that it is fully opened. Do not climb a closed stepladder. ?Make sure that both sides of the stepladder are locked into place. ?As

## 2022-01-12 NOTE — Progress Notes (Signed)
? ?Subjective:  ? ? Patient ID: Whitney Morales, female    DOB: [birth year], 81 y.o.   MRN: [id number] ?My plan at our last visit in January was: ? ?Patient would like to try alternating hydrochlorothiazide with Lasix 40 mg a day.  I am fine doing this but I am concerned that it may get confusing for her.  She states that she prefers to take the Lasix 40 mg daily however on the days that she has to travel she will just take the hydrochlorothiazide instead of the Lasix.  I feel that the Lasix will not provide enough blood pressure control but also recommended increasing lisinopril to 40 mg a day.  Recheck in March.  ? ?01/12/22 ?Lab on 01/10/2022  ?Component Date Value Ref Range Status  ? Glucose, Bld 01/10/2022 146 (H)  65 - 99 mg/dL Final  ? Comment: . ?           Fasting reference interval ?. ?For someone without known diabetes, a glucose ?value >125 mg/dL indicates that they may have ?diabetes and this should be confirmed with a ?follow-up test. ?. ?  ? BUN 01/10/2022 24  7 - 25 mg/dL Final  ? Creat 01/10/2022 1.41 (H)  0.60 - 1.00 mg/dL Final  ? BUN/Creatinine Ratio 01/10/2022 17  6 - 22 (calc) Final  ? Sodium 01/10/2022 143  135 - 146 mmol/L Final  ? Potassium 01/10/2022 4.9  3.5 - 5.3 mmol/L Final  ? Chloride 01/10/2022 106  98 - 110 mmol/L Final  ? CO2 01/10/2022 28  20 - 32 mmol/L Final  ? Calcium 01/10/2022 10.0  8.6 - 10.4 mg/dL Final  ? Hgb A1c MFr Bld 01/10/2022 6.4 (H)  <5.7 % of total Hgb Final  ? Comment: For someone without known diabetes, a hemoglobin  ?A1c value between 5.7% and 6.4% is consistent with ?prediabetes and should be confirmed with a  ?follow-up test. ?. ?For someone with known diabetes, a value <7% ?indicates that their diabetes is well controlled. A1c ?targets should be individualized based on duration of ?diabetes, age, comorbid conditions, and other ?considerations. ?. ?This assay result is consistent with an increased risk ?of diabetes. ?. ?Currently, no consensus exists regarding  use of ?hemoglobin A1c for diagnosis of diabetes for children. ?. ?  ? Mean Plasma Glucose 01/10/2022 137  mg/dL Final  ? eAG (mmol/L) 01/10/2022 7.6  mmol/L Final  ? ? ?Past Medical History:  ?Diagnosis Date  ? Abnormal finding on EKG 10/29/2012  ? FOLLOW UP NUCLEAR STRESS TEST ON 11/05/12 -NORMAL  ? Corneal erosion 2012  ? RIGHT EYE--HAS RESOLVED--NO PROBLEMS SINCE  ? Diabetes mellitus   ? Diverticulitis   ? Fatigue   ? GERD (gastroesophageal reflux disease)   ? PAST HX--NO PROBLEMS NOW-PT DOES TAKE DAILY TUMS AT BEDTIME  ? Goiter   ? cyst vs goiter on thyroid  ? H/O hiatal hernia   ? High cholesterol   ? Hypertension   ? Obesity   ? Osteopenia   ? ?Past Surgical History:  ?Procedure Laterality Date  ? ABDOMINAL HYSTERECTOMY    ? BREAST LUMPECTOMY    ? BENIGN  ? CARDIOVASCULAR STRESS TEST  06/08/2009  ? EF 78%, NO ISCHEMIA  ? THYROID LOBECTOMY  11/27/2012  ? Procedure: THYROID LOBECTOMY;  Surgeon: Ralene Ok, MD;  Location: [facility];  Service: General;  Laterality: Right;  Right Thyroid Lobectomy  ? TONSILLECTOMY    ? US ECHOCARDIOGRAPHY  03/23/2006  ? EF 55-60%  ? ?Current  Outpatient Medications on File Prior to Visit  ?Medication Sig Dispense Refill  ? Accu-Chek Softclix Lancets lancets USE AS DIRECTED TO CHECK SUGAR 200 each 3  ? allopurinol (ZYLOPRIM) 100 MG tablet TAKE 2 TABLETS BY MOUTH EVERY DAY 180 tablet 1  ? amLODipine (NORVASC) 10 MG tablet Take 1 tablet (10 mg total) by mouth daily. 90 tablet 3  ? Blood Glucose Monitoring Suppl (ACCU-CHEK AVIVA PLUS) W/DEVICE KIT PT NEEDS METER-STRIPS(1 BOTTLE =100/5 REFILLS)-LANCETS(1 BOX/5 REFILLS) CHECKS BS QD DX: 250.00 1 kit 0  ? Calcium Carbonate (CALCIUM 500 PO) Take by mouth.    ? cetirizine (ZYRTEC) 10 MG tablet Take 1 tablet (10 mg total) by mouth daily. 30 tablet 11  ? Cholecalciferol (VITAMIN D-3) 125 MCG (5000 UT) TABS Take by mouth.    ? cloNIDine (CATAPRES) 0.1 MG tablet Take 2 tablets (0.2 mg total) by mouth 3 (three) times daily as needed. 15 tablet 0  ?  colchicine 0.6 MG tablet TAKE 1 TABLET DAILY. TAKE 2 TABS IMMEDIATELY THEN 1 IN AN HOUR IF STILL HURTING 180 tablet 0  ? furosemide (LASIX) 40 MG tablet TAKE 1 TABLET (40 MG TOTAL) BY MOUTH DAILY AS NEEDED (DO NOT TAKE WITH HYDROCHLOROTHIAZIDE). 90 tablet 1  ? glipiZIDE (GLUCOTROL XL) 5 MG 24 hr tablet Take 1 tablet (5 mg total) by mouth daily with breakfast. 90 tablet 1  ? glucose blood (ACCU-CHEK AVIVA PLUS) test strip USE 2 (TWO) TIMES DAILY. CHECK BLOOD SUGAR TWICE DAILY FASTING IN MORNING, 2 HRS AFTER A MEAL E11.65 100 strip 3  ? hydrochlorothiazide (HYDRODIURIL) 25 MG tablet Take 1 tablet (25 mg total) by mouth daily. 90 tablet 3  ? JANUVIA 100 MG tablet TAKE 1 TABLET BY MOUTH EVERY DAY 90 tablet 2  ? levothyroxine (SYNTHROID) 88 MCG tablet Take 1 tablet (88 mcg total) by mouth daily. 90 tablet 3  ? lisinopril (ZESTRIL) 20 MG tablet Take 1 tablet (20 mg total) by mouth 2 (two) times daily. 180 tablet 3  ? metFORMIN (GLUCOPHAGE) 500 MG tablet TAKE TWO TABLETS BY MOUTH TWICE A DAY WITH A MEAL 360 tablet 2  ? Multiple Vitamin (MULTIVITAMIN WITH MINERALS) TABS Take 1 tablet by mouth daily.    ? simvastatin (ZOCOR) 80 MG tablet TAKE 1/2 TABLET BY MOUTH EVERY NIGHT AT BEDTIME 45 tablet 2  ? vitamin B-12 (CYANOCOBALAMIN) 1000 MCG tablet Take 1,000 mcg by mouth daily.    ? vitamin E 180 MG (400 UNITS) capsule Take 400 Units by mouth daily.    ? ?No current facility-administered medications on file prior to visit.  ? ?Allergies  ?Allergen Reactions  ? Clindamycin/Lincomycin   ?  CLINDAMYCIN TAKEN WITH CIPRO CAUSED SEVERE NAUSEA--PT STATES SHE CAN TAKE CIPRO ALONE WITHOUT PROBLEM--IT WAS JUST THE COMBINATION SHE DID NOT TOLERATE.  ? Levaquin [Levofloxacin] Nausea And Vomiting  ? Metronidazole Hcl Other (See Comments)  ?  Deathly sick WHEN PT TOOK METRONIDAZOLE WITH CIPRO.  PT STATES SHE HAS TAKEN CIPRO ALONE WITHOUT PROBLEM --JUST DID NOT TOLERATE THE COMBINATION OF BOTH DRUGS TAKEN TOGETHER.  ? ?Social History   ? ?Socioeconomic History  ? Marital status: Married  ?  Spouse name: Not on file  ? Number of children: Not on file  ? Years of education: Not on file  ? Highest education level: Not on file  ?Occupational History  ? Not on file  ?Tobacco Use  ? Smoking status: Never  ? Smokeless tobacco: Never  ?Substance and Sexual Activity  ? Alcohol  use: No  ? Drug use: No  ? Sexual activity: Not on file  ?Other Topics Concern  ? Not on file  ?Social History Narrative  ? Not on file  ? ?Social Determinants of Health  ? ?Financial Resource Strain: Not on file  ?Food Insecurity: Not on file  ?Transportation Needs: Not on file  ?Physical Activity: Not on file  ?Stress: Not on file  ?Social Connections: Not on file  ?Intimate Partner Violence: Not on file  ? ?No family history on file. ? ? ? ? ?Review of Systems  ?All other systems reviewed and are negative. ? ?   ?Objective:  ? Physical Exam ?Vitals reviewed.  ?Constitutional:   ?   General: She is not in acute distress. ?   Appearance: She is well-developed. She is not diaphoretic.  ?HENT:  ?   Right Ear: External ear normal.  ?   Left Ear: External ear normal.  ?   Nose: Nose normal.  ?Neck:  ?   Thyroid: No thyromegaly.  ?   Vascular: No JVD.  ?Cardiovascular:  ?   Rate and Rhythm: Normal rate and regular rhythm.  ?   Heart sounds: Normal heart sounds. No murmur heard. ?Pulmonary:  ?   Effort: Pulmonary effort is normal. No respiratory distress.  ?   Breath sounds: Normal breath sounds. No wheezing or rales.  ?Abdominal:  ?   General: Bowel sounds are normal. There is no distension.  ?   Palpations: Abdomen is soft. There is no mass.  ?   Tenderness: There is no abdominal tenderness. There is no guarding or rebound.  ?Musculoskeletal:  ?   Cervical back: Neck supple.  ?   Right lower leg: No edema.  ?   Left lower leg: No edema.  ?Skin: ?   Findings: No erythema or rash.  ?Neurological:  ?   Mental Status: She is alert and oriented to person, place, and time.  ?   Cranial  Nerves: No cranial nerve deficit.  ?   Motor: No abnormal muscle tone.  ?   Coordination: Coordination normal.  ? ? ? ? ? ?   ?Assessment & Plan:  ? ?

## 2022-01-12 NOTE — Progress Notes (Signed)
? ?Subjective:  ? Whitney Morales is a 81 y.o. female who presents for an Initial Medicare Annual Wellness Visit. ? ?Review of Systems    ? ?Cardiac Risk Factors include: advanced age (>27mn, >>60women);diabetes mellitus;hypertension;dyslipidemia;sedentary lifestyle;obesity (BMI >30kg/m2) ? ?   ?Objective:  ?  ?Today's Vitals  ? 01/12/22 1026 01/12/22 1029  ?Weight: 227 lb 12.8 oz (103.3 kg)   ?Height: '5\' 3"'  (1.6 m)   ?PainSc:  3   ? ?Body mass index is 40.35 kg/m?. ? ? ?  01/12/2022  ? 10:44 AM 09/29/2021  ?  9:08 PM 01/23/2021  ? 10:09 PM 01/18/2021  ?  2:12 PM 11/27/2012  ? 11:05 AM 10/29/2012  ?  1:34 PM  ?Advanced Directives  ?Does Patient Have a Medical Advance Directive? Yes Yes No No Patient has advance directive, copy not in chart Patient does not have advance directive;Patient would like information  ?Type of [place] Healthcare Power of Attorney      ?Copy of [place]? No - copy requested    Copy requested from family   ?Would patient like information on creating a medical advance directive?    No - Patient declined  Advance directive packet given  ?Pre-existing out of facility DNR order (yellow form or pink MOST form)     No No  ? ? ?Current Medications (verified) ?Outpatient Encounter Medications as of 01/12/2022  ?Medication Sig  ? Accu-Chek Softclix Lancets lancets USE AS DIRECTED TO CHECK SUGAR  ? allopurinol (ZYLOPRIM) 100 MG tablet TAKE 2 TABLETS BY MOUTH EVERY DAY  ? amLODipine (NORVASC) 10 MG tablet Take 1 tablet (10 mg total) by mouth daily.  ? Blood Glucose Monitoring Suppl (ACCU-CHEK AVIVA PLUS) W/DEVICE KIT PT NEEDS METER-STRIPS(1 BOTTLE =100/5 REFILLS)-LANCETS(1 BOX/5 REFILLS) CHECKS BS QD DX: 250.00  ? Calcium Carbonate (CALCIUM 500 PO) Take by mouth.  ? cetirizine (ZYRTEC) 10 MG tablet Take 1 tablet (10 mg total) by mouth daily.  ? Cholecalciferol (VITAMIN D-3) 125 MCG (5000 UT) TABS Take by mouth.  ? cloNIDine  (CATAPRES) 0.1 MG tablet Take 2 tablets (0.2 mg total) by mouth 3 (three) times daily as needed.  ? colchicine 0.6 MG tablet TAKE 1 TABLET DAILY. TAKE 2 TABS IMMEDIATELY THEN 1 IN AN HOUR IF STILL HURTING  ? furosemide (LASIX) 40 MG tablet TAKE 1 TABLET (40 MG TOTAL) BY MOUTH DAILY AS NEEDED (DO NOT TAKE WITH HYDROCHLOROTHIAZIDE).  ? glipiZIDE (GLUCOTROL XL) 5 MG 24 hr tablet Take 1 tablet (5 mg total) by mouth daily with breakfast.  ? glucose blood (ACCU-CHEK AVIVA PLUS) test strip USE 2 (TWO) TIMES DAILY. CHECK BLOOD SUGAR TWICE DAILY FASTING IN MORNING, 2 HRS AFTER A MEAL E11.65  ? hydrochlorothiazide (HYDRODIURIL) 25 MG tablet Take 1 tablet (25 mg total) by mouth daily.  ? JANUVIA 100 MG tablet TAKE 1 TABLET BY MOUTH EVERY DAY  ? levothyroxine (SYNTHROID) 88 MCG tablet Take 1 tablet (88 mcg total) by mouth daily.  ? lisinopril (ZESTRIL) 20 MG tablet Take 1 tablet (20 mg total) by mouth 2 (two) times daily.  ? metFORMIN (GLUCOPHAGE) 500 MG tablet TAKE TWO TABLETS BY MOUTH TWICE A DAY WITH A MEAL  ? Multiple Vitamin (MULTIVITAMIN WITH MINERALS) TABS Take 1 tablet by mouth daily.  ? simvastatin (ZOCOR) 80 MG tablet TAKE 1/2 TABLET BY MOUTH EVERY NIGHT AT BEDTIME  ? vitamin B-12 (CYANOCOBALAMIN) 1000 MCG tablet Take 1,000 mcg by mouth daily.  ?  vitamin E 180 MG (400 UNITS) capsule Take 400 Units by mouth daily.  ? ?No facility-administered encounter medications on file as of 01/12/2022.  ? ? ?Allergies (verified) ?Clindamycin/lincomycin, Levaquin [levofloxacin], and Metronidazole hcl  ? ?History: ?Past Medical History:  ?Diagnosis Date  ? Abnormal finding on EKG 10/29/2012  ? FOLLOW UP NUCLEAR STRESS TEST ON 11/05/12 -NORMAL  ? Corneal erosion 2012  ? RIGHT EYE--HAS RESOLVED--NO PROBLEMS SINCE  ? Diabetes mellitus   ? Diverticulitis   ? Fatigue   ? GERD (gastroesophageal reflux disease)   ? PAST HX--NO PROBLEMS NOW-PT DOES TAKE DAILY TUMS AT BEDTIME  ? Goiter   ? cyst vs goiter on thyroid  ? H/O hiatal hernia   ? High  cholesterol   ? Hypertension   ? Obesity   ? Osteopenia   ? ?Past Surgical History:  ?Procedure Laterality Date  ? ABDOMINAL HYSTERECTOMY    ? BREAST LUMPECTOMY    ? BENIGN  ? CARDIOVASCULAR STRESS TEST  06/08/2009  ? EF 78%, NO ISCHEMIA  ? THYROID LOBECTOMY  11/27/2012  ? Procedure: THYROID LOBECTOMY;  Surgeon: Ralene Ok, MD;  Location: [facility];  Service: General;  Laterality: Right;  Right Thyroid Lobectomy  ? TONSILLECTOMY    ? US ECHOCARDIOGRAPHY  03/23/2006  ? EF 55-60%  ? ?History reviewed. No pertinent family history. ?Social History  ? ?Socioeconomic History  ? Marital status: Married  ?  Spouse name: Sonia Side  ? Number of children: 2  ? Years of education: Not on file  ? Highest education level: Not on file  ?Occupational History  ? Not on file  ?Tobacco Use  ? Smoking status: Never  ? Smokeless tobacco: Never  ?Substance and Sexual Activity  ? Alcohol use: No  ? Drug use: No  ? Sexual activity: Not Currently  ?Other Topics Concern  ? Not on file  ?Social History Narrative  ? 2 sons  ? Grandchildren and great grandchildren  ? ?Social Determinants of Health  ? ?Financial Resource Strain: Low Risk   ? Difficulty of Paying Living Expenses: Not hard at all  ?Food Insecurity: No Food Insecurity  ? Worried About Charity fundraiser in the Last Year: Never true  ? Ran Out of Food in the Last Year: Never true  ?Transportation Needs: No Transportation Needs  ? Lack of Transportation (Medical): No  ? Lack of Transportation (Non-Medical): No  ?Physical Activity: Sufficiently Active  ? Days of Exercise per Week: 7 days  ? Minutes of Exercise per Session: 30 min  ?Stress: No Stress Concern Present  ? Feeling of Stress : Not at all  ?Social Connections: Moderately Integrated  ? Frequency of Communication with Friends and Family: More than three times a week  ? Frequency of Social Gatherings with Friends and Family: More than three times a week  ? Attends Religious Services: 1 to 4 times per year  ? Active Member of Clubs or  Organizations: No  ? Attends Archivist Meetings: Never  ? Marital Status: Married  ? ? ?Tobacco Counseling ?Counseling given: Not Answered ? ? ?Clinical Intake: ? ?Pre-visit preparation completed: Yes ? ?Pain : 0-10 ?Pain Score: 3  ?Pain Type: Chronic pain ?Pain Location: Back ?Pain Descriptors / Indicators: Aching, Dull ?Pain Onset: More than a month ago ?Pain Frequency: Intermittent ? ?  ? ?BMI - recorded: 40.35 ?Nutritional Status: BMI > 30  Obese ?Nutritional Risks: None ?Diabetes: Yes ? ?How often do you need to have someone help you when  you read instructions, pamphlets, or other written materials from your doctor or pharmacy?: 1 - Never ? ?Diabetic?Nutrition Risk Assessment: ? ?Has the patient had any N/V/D within the last 2 months?  No  ?Does the patient have any non-healing wounds?  No  ?Has the patient had any unintentional weight loss or weight gain?  No  ? ?Diabetes: ? ?Is the patient diabetic?  Yes  ?If diabetic, was a CBG obtained today?  No  ?Did the patient bring in their glucometer from home?  No  ?How often do you monitor your CBG's? 2-3 x per week.  ? ?Financial Strains and Diabetes Management: ? ?Are you having any financial strains with the device, your supplies or your medication? No .  ?Does the patient want to be seen by Chronic Care Management for management of their diabetes?  No  ?Would the patient like to be referred to a Nutritionist or for Diabetic Management?  No  ? ?Diabetic Exams: ? ?Diabetic Eye Exam: Completed 2022.Marland Kitchen  Pt has been advised about the importance in completing this exam.  ?Diabetic Foot Exam: Completed 12/20/2020. Pt has been advised about the importance in completing this exam.  ? ?Interpreter Needed?: No ? ?Information entered by :: mj Rilan Eiland, lpn ? ? ?Activities of Daily Living ? ?  01/12/2022  ? 10:46 AM  ?In your present state of health, do you have any difficulty performing the following activities:  ?Hearing? 0  ?Vision? 0  ?Difficulty concentrating or  making decisions? 0  ?Walking or climbing stairs? 0  ?Dressing or bathing? 0  ?Doing errands, shopping? 0  ?Preparing Food and eating ? N  ?Using the Toilet? N  ?In the past six months, have you accidently le

## 2022-01-13 ENCOUNTER — Encounter: Payer: Self-pay | Admitting: Family Medicine

## 2022-01-13 ENCOUNTER — Ambulatory Visit: Payer: Medicare Other | Admitting: Family Medicine

## 2022-01-13 VITALS — BP 148/56 | HR 88 | Temp 97.5°F | Ht 63.0 in | Wt 227.2 lb

## 2022-01-13 DIAGNOSIS — I10 Essential (primary) hypertension: Secondary | ICD-10-CM | POA: Diagnosis not present

## 2022-01-13 DIAGNOSIS — E1121 Type 2 diabetes mellitus with diabetic nephropathy: Secondary | ICD-10-CM

## 2022-01-13 MED ORDER — CLOTRIMAZOLE-BETAMETHASONE 1-0.05 % EX CREA: 1.0000 "application " | TOPICAL_CREAM | Freq: Every day | CUTANEOUS | 3 refills | Status: DC

## 2022-01-13 NOTE — Progress Notes (Signed)
? ?Subjective:  ? ? Patient ID: Whitney Morales, female    DOB: 01/22/1941, 80 y.o.   MRN: [id number] ? ?Patient is here today for recheck of her diabetes.  Her hemoglobin A1c has fallen to 6.4 which is outstanding.  She is taking Lasix on a daily basis.  She has +1 pitting edema in both legs including her ankles and her mid shins.  Her creatinine has risen from 1.2-1.4 and I suspect that this is due to the Lasix.  She denies any chest pain shortness of breath or dyspnea on exertion.  Her cholesterol was not checked with her other lab work a few days ago. ?Past Medical History:  ?Diagnosis Date  ? Abnormal finding on EKG 10/29/2012  ? FOLLOW UP NUCLEAR STRESS TEST ON 11/05/12 -NORMAL  ? Corneal erosion 2012  ? RIGHT EYE--HAS RESOLVED--NO PROBLEMS SINCE  ? Diabetes mellitus   ? Diverticulitis   ? Fatigue   ? GERD (gastroesophageal reflux disease)   ? PAST HX--NO PROBLEMS NOW-PT DOES TAKE DAILY TUMS AT BEDTIME  ? Goiter   ? cyst vs goiter on thyroid  ? H/O hiatal hernia   ? High cholesterol   ? Hypertension   ? Obesity   ? Osteopenia   ? ?Past Surgical History:  ?Procedure Laterality Date  ? ABDOMINAL HYSTERECTOMY    ? BREAST LUMPECTOMY    ? BENIGN  ? CARDIOVASCULAR STRESS TEST  06/08/2009  ? EF 78%, NO ISCHEMIA  ? THYROID LOBECTOMY  11/27/2012  ? Procedure: THYROID LOBECTOMY;  Surgeon: Armando Ramirez, MD;  Location: [facility];  Service: General;  Laterality: Right;  Right Thyroid Lobectomy  ? TONSILLECTOMY    ? US ECHOCARDIOGRAPHY  03/23/2006  ? EF 55-60%  ? ?Current Outpatient Medications on File Prior to Visit  ?Medication Sig Dispense Refill  ? Accu-Chek Softclix Lancets lancets USE AS DIRECTED TO CHECK SUGAR 200 each 3  ? allopurinol (ZYLOPRIM) 100 MG tablet TAKE 2 TABLETS BY MOUTH EVERY DAY 180 tablet 1  ? amLODipine (NORVASC) 10 MG tablet Take 1 tablet (10 mg total) by mouth daily. 90 tablet 3  ? Blood Glucose Monitoring Suppl (ACCU-CHEK AVIVA PLUS) W/DEVICE KIT PT NEEDS METER-STRIPS(1 BOTTLE =100/5 REFILLS)-LANCETS(1  BOX/5 REFILLS) CHECKS BS QD DX: 250.00 1 kit 0  ? Calcium Carbonate (CALCIUM 500 PO) Take by mouth.    ? cetirizine (ZYRTEC) 10 MG tablet Take 1 tablet (10 mg total) by mouth daily. 30 tablet 11  ? Cholecalciferol (VITAMIN D-3) 125 MCG (5000 UT) TABS Take by mouth.    ? cloNIDine (CATAPRES) 0.1 MG tablet Take 2 tablets (0.2 mg total) by mouth 3 (three) times daily as needed. 15 tablet 0  ? colchicine 0.6 MG tablet TAKE 1 TABLET DAILY. TAKE 2 TABS IMMEDIATELY THEN 1 IN AN HOUR IF STILL HURTING 180 tablet 0  ? furosemide (LASIX) 40 MG tablet TAKE 1 TABLET (40 MG TOTAL) BY MOUTH DAILY AS NEEDED (DO NOT TAKE WITH HYDROCHLOROTHIAZIDE). 90 tablet 1  ? glipiZIDE (GLUCOTROL XL) 5 MG 24 hr tablet Take 1 tablet (5 mg total) by mouth daily with breakfast. 90 tablet 1  ? glucose blood (ACCU-CHEK AVIVA PLUS) test strip USE 2 (TWO) TIMES DAILY. CHECK BLOOD SUGAR TWICE DAILY FASTING IN MORNING, 2 HRS AFTER A MEAL E11.65 100 strip 3  ? hydrochlorothiazide (HYDRODIURIL) 25 MG tablet Take 1 tablet (25 mg total) by mouth daily. 90 tablet 3  ? JANUVIA 100 MG tablet TAKE 1 TABLET BY MOUTH EVERY DAY 90 tablet   2  ? levothyroxine (SYNTHROID) 88 MCG tablet Take 1 tablet (88 mcg total) by mouth daily. 90 tablet 3  ? lisinopril (ZESTRIL) 20 MG tablet Take 1 tablet (20 mg total) by mouth 2 (two) times daily. 180 tablet 3  ? metFORMIN (GLUCOPHAGE) 500 MG tablet TAKE TWO TABLETS BY MOUTH TWICE A DAY WITH A MEAL 360 tablet 2  ? Multiple Vitamin (MULTIVITAMIN WITH MINERALS) TABS Take 1 tablet by mouth daily.    ? simvastatin (ZOCOR) 80 MG tablet TAKE 1/2 TABLET BY MOUTH EVERY NIGHT AT BEDTIME 45 tablet 2  ? vitamin B-12 (CYANOCOBALAMIN) 1000 MCG tablet Take 1,000 mcg by mouth daily.    ? vitamin E 180 MG (400 UNITS) capsule Take 400 Units by mouth daily.    ? ?No current facility-administered medications on file prior to visit.  ? ?Allergies  ?Allergen Reactions  ? Clindamycin/Lincomycin   ?  CLINDAMYCIN TAKEN WITH CIPRO CAUSED SEVERE NAUSEA--PT  STATES SHE CAN TAKE CIPRO ALONE WITHOUT PROBLEM--IT WAS JUST THE COMBINATION SHE DID NOT TOLERATE.  ? Levaquin [Levofloxacin] Nausea And Vomiting  ? Metronidazole Hcl Other (See Comments)  ?  Deathly sick WHEN PT TOOK METRONIDAZOLE WITH CIPRO.  PT STATES SHE HAS TAKEN CIPRO ALONE WITHOUT PROBLEM --JUST DID NOT TOLERATE THE COMBINATION OF BOTH DRUGS TAKEN TOGETHER.  ? ?Social History  ? ?Socioeconomic History  ? Marital status: Married  ?  Spouse name: Jerry  ? Number of children: 2  ? Years of education: Not on file  ? Highest education level: Not on file  ?Occupational History  ? Not on file  ?Tobacco Use  ? Smoking status: Never  ? Smokeless tobacco: Never  ?Substance and Sexual Activity  ? Alcohol use: No  ? Drug use: No  ? Sexual activity: Not Currently  ?Other Topics Concern  ? Not on file  ?Social History Narrative  ? 2 sons  ? Grandchildren and great grandchildren  ? ?Social Determinants of Health  ? ?Financial Resource Strain: Low Risk   ? Difficulty of Paying Living Expenses: Not hard at all  ?Food Insecurity: No Food Insecurity  ? Worried About Running Out of Food in the Last Year: Never true  ? Ran Out of Food in the Last Year: Never true  ?Transportation Needs: No Transportation Needs  ? Lack of Transportation (Medical): No  ? Lack of Transportation (Non-Medical): No  ?Physical Activity: Sufficiently Active  ? Days of Exercise per Week: 7 days  ? Minutes of Exercise per Session: 30 min  ?Stress: No Stress Concern Present  ? Feeling of Stress : Not at all  ?Social Connections: Moderately Integrated  ? Frequency of Communication with Friends and Family: More than three times a week  ? Frequency of Social Gatherings with Friends and Family: More than three times a week  ? Attends Religious Services: 1 to 4 times per year  ? Active Member of Clubs or Organizations: No  ? Attends Club or Organization Meetings: Never  ? Marital Status: Married  ?Intimate Partner Violence: Not At Risk  ? Fear of Current or  Ex-Partner: No  ? Emotionally Abused: No  ? Physically Abused: No  ? Sexually Abused: No  ? ?History reviewed. No pertinent family history. ? ? ? ? ?Review of Systems  ?All other systems reviewed and are negative. ? ?   ?Objective:  ? Physical Exam ?Vitals reviewed.  ?Constitutional:   ?   General: She is not in acute distress. ?   Appearance: She is   well-developed. She is not diaphoretic.  ?HENT:  ?   Right Ear: External ear normal.  ?   Left Ear: External ear normal.  ?   Nose: Nose normal.  ?Neck:  ?   Thyroid: No thyromegaly.  ?   Vascular: No JVD.  ?Cardiovascular:  ?   Rate and Rhythm: Normal rate and regular rhythm.  ?   Heart sounds: Normal heart sounds. No murmur heard. ?Pulmonary:  ?   Effort: Pulmonary effort is normal. No respiratory distress.  ?   Breath sounds: Normal breath sounds. No wheezing or rales.  ?Abdominal:  ?   General: Bowel sounds are normal. There is no distension.  ?   Palpations: Abdomen is soft. There is no mass.  ?   Tenderness: There is no abdominal tenderness. There is no guarding or rebound.  ?Musculoskeletal:  ?   Cervical back: Neck supple.  ?   Right lower leg: No edema.  ?   Left lower leg: No edema.  ?Skin: ?   Findings: No erythema or rash.  ?Neurological:  ?   Mental Status: She is alert and oriented to person, place, and time.  ?   Cranial Nerves: No cranial nerve deficit.  ?   Motor: No abnormal muscle tone.  ?   Coordination: Coordination normal.  ? ? ? ? ? ?   ?Assessment & Plan:  ?Essential hypertension ? ?Controlled type 2 diabetes mellitus with diabetic nephropathy, without long-term current use of insulin (HCC) ?I am very happy with her diabetes test.  I encouraged the patient to try to drink more fluid because I believe her creatinine is likely due to dehydration.  Her blood pressure is too high.  I will check a fasting lipid panel to see what her LDL cholesterol is.  Once I have all of her lab work back I suspect that I will suggest that she try doxazosin 2 mg a  day to try to lower her blood pressure below the 140 threshold. ?

## 2022-01-14 LAB — LIPID PANEL
Cholesterol: 125 mg/dL (ref <200)
HDL: 40 mg/dL — ABNORMAL LOW (ref >=50)
LDL Cholesterol (Calc): 64 mg/dL (calc)
Non-HDL Cholesterol (Calc): 85 mg/dL (calc) (ref <130)
Total CHOL/HDL Ratio: 3.1 (calc) (ref <5.0)
Triglycerides: 133 mg/dL (ref <150)

## 2022-01-14 LAB — BASIC METABOLIC PANEL
BUN/Creatinine Ratio: 17 (calc) (ref 6–22)
BUN: 24 mg/dL (ref 7–25)
CO2: 28 mmol/L (ref 20–32)
Calcium: 10 mg/dL (ref 8.6–10.4)
Chloride: 106 mmol/L (ref 98–110)
Creat: 1.41 mg/dL — ABNORMAL HIGH (ref 0.60–1.00)
Glucose, Bld: 146 mg/dL — ABNORMAL HIGH (ref 65–99)
Potassium: 4.9 mmol/L (ref 3.5–5.3)
Sodium: 143 mmol/L (ref 135–146)

## 2022-01-14 LAB — TEST AUTHORIZATION

## 2022-01-14 LAB — HEMOGLOBIN A1C
Hgb A1c MFr Bld: 6.4 % of total Hgb — ABNORMAL HIGH (ref <5.7)
Mean Plasma Glucose: 137 mg/dL
eAG (mmol/L): 7.6 mmol/L

## 2022-01-19 ENCOUNTER — Ambulatory Visit: Payer: Medicare Other | Admitting: Family Medicine

## 2022-02-01 ENCOUNTER — Other Ambulatory Visit: Payer: Self-pay | Admitting: Family Medicine

## 2022-03-10 ENCOUNTER — Ambulatory Visit: Payer: Medicare Other | Admitting: Family Medicine

## 2022-03-10 VITALS — BP 154/64 | HR 102 | Temp 98.3°F | Ht 63.0 in | Wt 225.4 lb

## 2022-03-10 DIAGNOSIS — I10 Essential (primary) hypertension: Secondary | ICD-10-CM

## 2022-03-10 NOTE — Progress Notes (Signed)
Subjective:    Patient ID: Whitney Morales, female    DOB: 01/19/1941, 81 y.o.   MRN: [id number] Please see my last office visit.  The patient presents today with 1+ edema in both legs from the foot to the ankle.  She has hemosiderin changes on the anterior shin laterally.  This has her very concerned.  She is taking amlodipine.  She takes Lasix every day.  She only takes hydrochlorothiazide when she travels if he takes it in place of Lasix.  She is on the maximum dose of lisinopril.  Her blood pressure at home was 130-140/60-70.  She states that she is nervous today. Past Medical History:  Diagnosis Date   Abnormal finding on EKG 10/29/2012   FOLLOW UP NUCLEAR STRESS TEST ON 11/05/12 -NORMAL   Corneal erosion 2012   RIGHT EYE--HAS RESOLVED--NO PROBLEMS SINCE   Diabetes mellitus    Diverticulitis    Fatigue    GERD (gastroesophageal reflux disease)    PAST HX--NO PROBLEMS NOW-PT DOES TAKE DAILY TUMS AT BEDTIME   Goiter    cyst vs goiter on thyroid   H/O hiatal hernia    High cholesterol    Hypertension    Obesity    Osteopenia    Past Surgical History:  Procedure Laterality Date   ABDOMINAL HYSTERECTOMY     BREAST LUMPECTOMY     BENIGN   CARDIOVASCULAR STRESS TEST  06/08/2009   EF 78%, NO ISCHEMIA   THYROID LOBECTOMY  11/27/2012   Procedure: THYROID LOBECTOMY;  Surgeon: Ralene Ok, MD;  Location: [facility];  Service: General;  Laterality: Right;  Right Thyroid Lobectomy   TONSILLECTOMY     US ECHOCARDIOGRAPHY  03/23/2006   EF 55-60%   Current Outpatient Medications on File Prior to Visit  Medication Sig Dispense Refill   Accu-Chek Softclix Lancets lancets USE AS DIRECTED TO CHECK SUGAR 200 each 3   allopurinol (ZYLOPRIM) 100 MG tablet TAKE 2 TABLETS BY MOUTH EVERY DAY 180 tablet 1   amLODipine (NORVASC) 10 MG tablet Take 1 tablet (10 mg total) by mouth daily. 90 tablet 3   Blood Glucose Monitoring Suppl (ACCU-CHEK AVIVA PLUS) W/DEVICE KIT PT NEEDS METER-STRIPS(1 BOTTLE =100/5  REFILLS)-LANCETS(1 BOX/5 REFILLS) CHECKS BS QD DX: 250.00 1 kit 0   Calcium Carbonate (CALCIUM 500 PO) Take by mouth.     cetirizine (ZYRTEC) 10 MG tablet Take 1 tablet (10 mg total) by mouth daily. 30 tablet 11   Cholecalciferol (VITAMIN D-3) 125 MCG (5000 UT) TABS Take by mouth.     cloNIDine (CATAPRES) 0.1 MG tablet Take 2 tablets (0.2 mg total) by mouth 3 (three) times daily as needed. 15 tablet 0   clotrimazole-betamethasone (LOTRISONE) cream Apply 1 application. topically daily. 30 g 3   colchicine 0.6 MG tablet TAKE 1 TABLET DAILY. TAKE 2 TABS IMMEDIATELY THEN 1 IN AN HOUR IF STILL HURTING 180 tablet 0   furosemide (LASIX) 40 MG tablet TAKE 1 TABLET (40 MG TOTAL) BY MOUTH DAILY AS NEEDED (DO NOT TAKE WITH HYDROCHLOROTHIAZIDE). 90 tablet 1   glipiZIDE (GLUCOTROL XL) 5 MG 24 hr tablet Take 1 tablet (5 mg total) by mouth daily with breakfast. 90 tablet 1   glucose blood (ACCU-CHEK AVIVA PLUS) test strip USE 2 (TWO) TIMES DAILY. CHECK BLOOD SUGAR TWICE DAILY FASTING IN MORNING, 2 HRS AFTER A MEAL E11.65 100 strip 3   hydrochlorothiazide (HYDRODIURIL) 25 MG tablet Take 1 tablet (25 mg total) by mouth daily. 90 tablet 3  JANUVIA 100 MG tablet TAKE 1 TABLET BY MOUTH EVERY DAY 90 tablet 2   levothyroxine (SYNTHROID) 88 MCG tablet Take 1 tablet (88 mcg total) by mouth daily. 90 tablet 3   lisinopril (ZESTRIL) 20 MG tablet Take 1 tablet (20 mg total) by mouth 2 (two) times daily. 180 tablet 3   metFORMIN (GLUCOPHAGE) 500 MG tablet TAKE TWO TABLETS BY MOUTH TWICE A DAY WITH A MEAL 360 tablet 2   Multiple Vitamin (MULTIVITAMIN WITH MINERALS) TABS Take 1 tablet by mouth daily.     simvastatin (ZOCOR) 80 MG tablet TAKE 1/2 TABLET BY MOUTH EVERY NIGHT AT BEDTIME 45 tablet 2   vitamin B-12 (CYANOCOBALAMIN) 1000 MCG tablet Take 1,000 mcg by mouth daily.     vitamin E 180 MG (400 UNITS) capsule Take 400 Units by mouth daily.     No current facility-administered medications on file prior to visit.    Allergies  Allergen Reactions   Clindamycin/Lincomycin     CLINDAMYCIN TAKEN WITH CIPRO CAUSED SEVERE NAUSEA--PT STATES SHE CAN TAKE CIPRO ALONE WITHOUT PROBLEM--IT WAS JUST THE COMBINATION SHE DID NOT TOLERATE.   Levaquin [Levofloxacin] Nausea And Vomiting   Metronidazole Hcl Other (See Comments)    Deathly sick WHEN PT TOOK METRONIDAZOLE WITH CIPRO.  PT STATES SHE HAS TAKEN CIPRO ALONE WITHOUT PROBLEM --JUST DID NOT TOLERATE THE COMBINATION OF BOTH DRUGS TAKEN TOGETHER.   Social History   Socioeconomic History   Marital status: Married    Spouse name: Sonia Side   Number of children: 2   Years of education: Not on file   Highest education level: Not on file  Occupational History   Not on file  Tobacco Use   Smoking status: Never   Smokeless tobacco: Never  Substance and Sexual Activity   Alcohol use: No   Drug use: No   Sexual activity: Not Currently  Other Topics Concern   Not on file  Social History Narrative   2 sons   Grandchildren and great grandchildren   Social Determinants of Health   Financial Resource Strain: Low Risk    Difficulty of Paying Living Expenses: Not hard at all  Food Insecurity: No Food Insecurity   Worried About Charity fundraiser in the Last Year: Never true   Arboriculturist in the Last Year: Never true  Transportation Needs: No Transportation Needs   Lack of Transportation (Medical): No   Lack of Transportation (Non-Medical): No  Physical Activity: Sufficiently Active   Days of Exercise per Week: 7 days   Minutes of Exercise per Session: 30 min  Stress: No Stress Concern Present   Feeling of Stress : Not at all  Social Connections: Moderately Integrated   Frequency of Communication with Friends and Family: More than three times a week   Frequency of Social Gatherings with Friends and Family: More than three times a week   Attends Religious Services: 1 to 4 times per year   Active Member of [employer] or Organizations: No   Attends Theatre manager Meetings: Never   Marital Status: Married  Human resources officer Violence: Not At Risk   Fear of Current or Ex-Partner: No   Emotionally Abused: No   Physically Abused: No   Sexually Abused: No   No family history on file.     Review of Systems  All other systems reviewed and are negative.     Objective:   Physical Exam Vitals reviewed.  Constitutional:      General:  She is not in acute distress.    Appearance: She is well-developed. She is not diaphoretic.  HENT:     Right Ear: External ear normal.     Left Ear: External ear normal.     Nose: Nose normal.  Neck:     Thyroid: No thyromegaly.     Vascular: No JVD.  Cardiovascular:     Rate and Rhythm: Normal rate and regular rhythm.     Heart sounds: Normal heart sounds. No murmur heard. Pulmonary:     Effort: Pulmonary effort is normal. No respiratory distress.     Breath sounds: Normal breath sounds. No wheezing or rales.  Abdominal:     General: Bowel sounds are normal. There is no distension.     Palpations: Abdomen is soft. There is no mass.     Tenderness: There is no abdominal tenderness. There is no guarding or rebound.  Musculoskeletal:     Cervical back: Neck supple.     Right lower leg: Edema present.     Left lower leg: Edema present.  Skin:    Findings: No erythema or rash.  Neurological:     Mental Status: She is alert and oriented to person, place, and time.     Cranial Nerves: No cranial nerve deficit.     Motor: No abnormal muscle tone.     Coordination: Coordination normal.          Assessment & Plan:  Essential hypertension I discussed with the patient switching amlodipine to Bystolic or doxazosin to try to decrease the swelling.  Patient is uncomfortable making the switch at this time.  She prefers to try to wear her compression hose and then reassess in 1 month.  She will keep track of her blood pressure and bring with her when she comes in in 1 month for fasting lab work.

## 2022-03-19 ENCOUNTER — Other Ambulatory Visit: Payer: Self-pay | Admitting: Family Medicine

## 2022-03-21 NOTE — Telephone Encounter (Signed)
Requested Prescriptions  Pending Prescriptions Disp Refills  . glipiZIDE (GLUCOTROL XL) 5 MG 24 hr tablet [Pharmacy Med Name: GLIPIZIDE ER 5 MG TABLET] 90 tablet 1    Sig: TAKE 1 TABLET BY MOUTH EVERY DAY WITH BREAKFAST     Endocrinology:  Diabetes - Sulfonylureas Failed - 03/19/2022  3:39 PM      Failed - Cr in normal range and within 360 days    Creat  Date Value Ref Range Status  01/10/2022 1.41 (H) 0.60 - 1.00 mg/dL Final   Creatinine, Urine  Date Value Ref Range Status  12/14/2020 80 20 - 275 mg/dL Final         Passed - HBA1C is between 0 and 7.9 and within 180 days    Hgb A1c MFr Bld  Date Value Ref Range Status  01/10/2022 6.4 (H) <5.7 % of total Hgb Final    Comment:    For someone without known diabetes, a hemoglobin  A1c value between 5.7% and 6.4% is consistent with prediabetes and should be confirmed with a  follow-up test. . For someone with known diabetes, a value <7% indicates that their diabetes is well controlled. A1c targets should be individualized based on duration of diabetes, age, comorbid conditions, and other considerations. . This assay result is consistent with an increased risk of diabetes. . Currently, no consensus exists regarding use of hemoglobin A1c for diagnosis of diabetes for children. Renella Cunas - Valid encounter within last 6 months    Recent Outpatient Visits          1 week ago Essential hypertension   [place], Cammie Mcgee, MD   2 months ago Essential hypertension   [place], Cammie Mcgee, MD   3 months ago Essential hypertension   [place], Cammie Mcgee, MD   5 months ago Essential hypertension   [place], Cammie Mcgee, MD   5 months ago Essential hypertension   [place] [facility], Cammie Mcgee, MD

## 2022-04-14 ENCOUNTER — Other Ambulatory Visit: Payer: Medicare Other

## 2022-04-14 DIAGNOSIS — E1121 Type 2 diabetes mellitus with diabetic nephropathy: Secondary | ICD-10-CM

## 2022-04-14 DIAGNOSIS — E785 Hyperlipidemia, unspecified: Secondary | ICD-10-CM

## 2022-04-14 DIAGNOSIS — I10 Essential (primary) hypertension: Secondary | ICD-10-CM

## 2022-04-18 ENCOUNTER — Ambulatory Visit: Payer: Medicare Other | Admitting: Family Medicine

## 2022-04-18 VITALS — BP 138/72 | HR 91 | Temp 98.4°F | Ht 63.0 in | Wt 229.0 lb

## 2022-04-18 DIAGNOSIS — I10 Essential (primary) hypertension: Secondary | ICD-10-CM | POA: Diagnosis not present

## 2022-04-18 DIAGNOSIS — N1831 Chronic kidney disease, stage 3a: Secondary | ICD-10-CM | POA: Diagnosis not present

## 2022-04-18 DIAGNOSIS — E038 Other specified hypothyroidism: Secondary | ICD-10-CM

## 2022-04-18 DIAGNOSIS — E1121 Type 2 diabetes mellitus with diabetic nephropathy: Secondary | ICD-10-CM

## 2022-04-18 MED ORDER — SEMAGLUTIDE(0.25 OR 0.5MG/DOS) 2 MG/3ML ~~LOC~~ SOPN: 0.5000 mg | PEN_INJECTOR | SUBCUTANEOUS | 1 refills | Status: DC

## 2022-04-19 LAB — BASIC METABOLIC PANEL
BUN/Creatinine Ratio: 16 (calc) (ref 6–22)
BUN: 21 mg/dL (ref 7–25)
CO2: 28 mmol/L (ref 20–32)
Calcium: 9.7 mg/dL (ref 8.6–10.4)
Chloride: 104 mmol/L (ref 98–110)
Creat: 1.35 mg/dL — ABNORMAL HIGH (ref 0.60–1.00)
Glucose, Bld: 162 mg/dL — ABNORMAL HIGH (ref 65–99)
Potassium: 5.1 mmol/L (ref 3.5–5.3)
Sodium: 141 mmol/L (ref 135–146)

## 2022-04-19 LAB — TSH: TSH: 3.69 mIU/L (ref 0.40–4.50)

## 2022-04-19 LAB — LIPID PANEL
Cholesterol: 135 mg/dL (ref <200)
HDL: 38 mg/dL — ABNORMAL LOW (ref >=50)
LDL Cholesterol (Calc): 74 mg/dL (calc)
Non-HDL Cholesterol (Calc): 97 mg/dL (calc) (ref <130)
Total CHOL/HDL Ratio: 3.6 (calc) (ref <5.0)
Triglycerides: 157 mg/dL — ABNORMAL HIGH (ref <150)

## 2022-04-19 LAB — TEST AUTHORIZATION

## 2022-04-19 LAB — HEMOGLOBIN A1C
Hgb A1c MFr Bld: 6.5 % of total Hgb — ABNORMAL HIGH (ref <5.7)
Mean Plasma Glucose: 140 mg/dL
eAG (mmol/L): 7.7 mmol/L

## 2022-05-03 ENCOUNTER — Other Ambulatory Visit: Payer: Self-pay | Admitting: Family Medicine

## 2022-05-07 ENCOUNTER — Other Ambulatory Visit: Payer: Self-pay | Admitting: Family Medicine

## 2022-05-09 NOTE — Telephone Encounter (Signed)
Refused lisinopril 20 mg because requested too early.  11/22/2021 #180, 3 refills given

## 2022-05-10 ENCOUNTER — Telehealth: Payer: Self-pay

## 2022-05-10 ENCOUNTER — Other Ambulatory Visit: Payer: Self-pay

## 2022-05-10 NOTE — Telephone Encounter (Signed)
Pt called stating that she has been taking her Ozempic for 2 weeks now. She is has been feeling nausea and seems to be getting worst. Would like something for nausea and advice

## 2022-05-11 ENCOUNTER — Other Ambulatory Visit: Payer: Self-pay | Admitting: Family Medicine

## 2022-05-11 MED ORDER — ONDANSETRON HCL 4 MG PO TABS: 4.0000 mg | ORAL_TABLET | Freq: Three times a day (TID) | ORAL | 0 refills | Status: DC | PRN

## 2022-05-11 NOTE — Telephone Encounter (Signed)
Pt aware Rx has been sent to pharmacy Per pt wants to continue on ozempic

## 2022-05-16 ENCOUNTER — Ambulatory Visit: Payer: Medicare Other | Admitting: Family Medicine

## 2022-05-16 VITALS — BP 124/62 | HR 86 | Temp 98.6°F | Ht 63.0 in | Wt 213.0 lb

## 2022-05-16 DIAGNOSIS — N1831 Chronic kidney disease, stage 3a: Secondary | ICD-10-CM | POA: Diagnosis not present

## 2022-05-16 DIAGNOSIS — E1121 Type 2 diabetes mellitus with diabetic nephropathy: Secondary | ICD-10-CM | POA: Diagnosis not present

## 2022-05-16 NOTE — Progress Notes (Signed)
Subjective:    Patient ID: Whitney Morales, female    DOB: 02-Jan-1941, 81 y.o.   MRN: [id number]  In 1 month, the patient has lost 16 pounds!  The swelling in her legs has resolved.  She continues to take furosemide.  She is no longer taking Januvia or glipizide.  She is on Ozempic 0.5 mg subcu weekly.  She is battling nausea.  With the drastic sudden weight loss she feels weak and tired.  I am concerned that she has become dehydrated.  Her blood pressure is excellent.  She denies any chest pain or shortness of breath but she does report feeling tired quickly and decreasing stamina however I attribute that to the 16 pound weight loss in less than a month Past Medical History:  Diagnosis Date   Abnormal finding on EKG 10/29/2012   FOLLOW UP NUCLEAR STRESS TEST ON 11/05/12 -NORMAL   Corneal erosion 2012   RIGHT EYE--HAS RESOLVED--NO PROBLEMS SINCE   Diabetes mellitus    Diverticulitis    Fatigue    GERD (gastroesophageal reflux disease)    PAST HX--NO PROBLEMS NOW-PT DOES TAKE DAILY TUMS AT BEDTIME   Goiter    cyst vs goiter on thyroid   H/O hiatal hernia    High cholesterol    Hypertension    Obesity    Osteopenia    Past Surgical History:  Procedure Laterality Date   ABDOMINAL HYSTERECTOMY     BREAST LUMPECTOMY     BENIGN   CARDIOVASCULAR STRESS TEST  06/08/2009   EF 78%, NO ISCHEMIA   THYROID LOBECTOMY  11/27/2012   Procedure: THYROID LOBECTOMY;  Surgeon: Ralene Ok, MD;  Location: [facility];  Service: General;  Laterality: Right;  Right Thyroid Lobectomy   TONSILLECTOMY     US ECHOCARDIOGRAPHY  03/23/2006   EF 55-60%   Current Outpatient Medications on File Prior to Visit  Medication Sig Dispense Refill   Accu-Chek Softclix Lancets lancets USE AS DIRECTED TO CHECK SUGAR 100 each 3   allopurinol (ZYLOPRIM) 100 MG tablet TAKE 2 TABLETS BY MOUTH EVERY DAY 180 tablet 1   amLODipine (NORVASC) 10 MG tablet Take 1 tablet (10 mg total) by mouth daily. 90 tablet 3   Blood Glucose  Monitoring Suppl (ACCU-CHEK AVIVA PLUS) W/DEVICE KIT PT NEEDS METER-STRIPS(1 BOTTLE =100/5 REFILLS)-LANCETS(1 BOX/5 REFILLS) CHECKS BS QD DX: 250.00 1 kit 0   Calcium Carbonate (CALCIUM 500 PO) Take by mouth.     cetirizine (ZYRTEC) 10 MG tablet Take 1 tablet (10 mg total) by mouth daily. 30 tablet 11   Cholecalciferol (VITAMIN D-3) 125 MCG (5000 UT) TABS Take by mouth.     cloNIDine (CATAPRES) 0.1 MG tablet Take 2 tablets (0.2 mg total) by mouth 3 (three) times daily as needed. 15 tablet 0   clotrimazole-betamethasone (LOTRISONE) cream Apply 1 application. topically daily. 30 g 3   colchicine 0.6 MG tablet TAKE 1 TABLET DAILY. TAKE 2 TABS IMMEDIATELY THEN 1 IN AN HOUR IF STILL HURTING 180 tablet 0   furosemide (LASIX) 40 MG tablet TAKE 1 TABLET (40 MG TOTAL) BY MOUTH DAILY AS NEEDED (DO NOT TAKE WITH HYDROCHLOROTHIAZIDE). 90 tablet 1   glucose blood (ACCU-CHEK AVIVA PLUS) test strip CHECK BLOOD SUGAR TWICE DAILY FASTING IN MORNING, 2 HOURS AFTER A MEAL E11.65O 100 strip 3   levothyroxine (SYNTHROID) 88 MCG tablet Take 1 tablet (88 mcg total) by mouth daily. 90 tablet 3   lisinopril (ZESTRIL) 20 MG tablet Take 1 tablet (20 mg total)  by mouth 2 (two) times daily. 180 tablet 3   metFORMIN (GLUCOPHAGE) 500 MG tablet TAKE TWO TABLETS BY MOUTH TWICE A DAY WITH A MEAL 360 tablet 2   Multiple Vitamin (MULTIVITAMIN WITH MINERALS) TABS Take 1 tablet by mouth daily.     ondansetron (ZOFRAN) 4 MG tablet Take 1 tablet (4 mg total) by mouth every 8 (eight) hours as needed for nausea or vomiting. 20 tablet 0   Semaglutide,0.25 or 0.5MG/DOS, 2 MG/3ML SOPN Inject 0.5 mg into the skin once a week. 3 mL 1   simvastatin (ZOCOR) 80 MG tablet TAKE 1/2 TABLET BY MOUTH EVERY NIGHT AT BEDTIME 45 tablet 2   vitamin B-12 (CYANOCOBALAMIN) 1000 MCG tablet Take 1,000 mcg by mouth daily.     vitamin E 180 MG (400 UNITS) capsule Take 400 Units by mouth daily.     No current facility-administered medications on file prior to  visit.   Allergies  Allergen Reactions   Clindamycin/Lincomycin     CLINDAMYCIN TAKEN WITH CIPRO CAUSED SEVERE NAUSEA--PT STATES SHE CAN TAKE CIPRO ALONE WITHOUT PROBLEM--IT WAS JUST THE COMBINATION SHE DID NOT TOLERATE.   Levaquin [Levofloxacin] Nausea And Vomiting   Metronidazole Hcl Other (See Comments)    Deathly sick WHEN PT TOOK METRONIDAZOLE WITH CIPRO.  PT STATES SHE HAS TAKEN CIPRO ALONE WITHOUT PROBLEM --JUST DID NOT TOLERATE THE COMBINATION OF BOTH DRUGS TAKEN TOGETHER.   Social History   Socioeconomic History   Marital status: Married    Spouse name: Sonia Side   Number of children: 2   Years of education: Not on file   Highest education level: Not on file  Occupational History   Not on file  Tobacco Use   Smoking status: Never   Smokeless tobacco: Never  Substance and Sexual Activity   Alcohol use: No   Drug use: No   Sexual activity: Not Currently  Other Topics Concern   Not on file  Social History Narrative   2 sons   Lynnette Caffey and great grandchildren   Social Determinants of Health   Financial Resource Strain: Low Risk  (01/12/2022)   Overall Financial Resource Strain (CARDIA)    Difficulty of Paying Living Expenses: Not hard at all  Food Insecurity: No Food Insecurity (01/12/2022)   Hunger Vital Sign    Worried About Running Out of Food in the Last Year: Never true    Ran Out of Food in the Last Year: Never true  Transportation Needs: No Transportation Needs (01/12/2022)   PRAPARE - Hydrologist (Medical): No    Lack of Transportation (Non-Medical): No  Physical Activity: Sufficiently Active (01/12/2022)   Exercise Vital Sign    Days of Exercise per Week: 7 days    Minutes of Exercise per Session: 30 min  Stress: No Stress Concern Present (01/12/2022)   [place]    Feeling of Stress : Not at all  Social Connections: Moderately Integrated (01/12/2022)   Social  Connection and Isolation Panel [NHANES]    Frequency of Communication with Friends and Family: More than three times a week    Frequency of Social Gatherings with Friends and Family: More than three times a week    Attends Religious Services: 1 to 4 times per year    Active Member of [employer] or Organizations: No    Attends Archivist Meetings: Never    Marital Status: Married  Human resources officer Violence: Not At Risk (01/12/2022)  Humiliation, Afraid, Rape, and Kick questionnaire    Fear of Current or Ex-Partner: No    Emotionally Abused: No    Physically Abused: No    Sexually Abused: No   No family history on file.     Review of Systems  All other systems reviewed and are negative.      Objective:   Physical Exam Vitals reviewed.  Constitutional:      General: She is not in acute distress.    Appearance: She is well-developed. She is not diaphoretic.  HENT:     Right Ear: External ear normal.     Left Ear: External ear normal.     Nose: Nose normal.  Neck:     Thyroid: No thyromegaly.     Vascular: No JVD.  Cardiovascular:     Rate and Rhythm: Normal rate and regular rhythm.     Heart sounds: Normal heart sounds. No murmur heard. Pulmonary:     Effort: Pulmonary effort is normal. No respiratory distress.     Breath sounds: Normal breath sounds. No wheezing or rales.  Abdominal:     General: Bowel sounds are normal. There is no distension.     Palpations: Abdomen is soft. There is no mass.     Tenderness: There is no abdominal tenderness. There is no guarding or rebound.  Musculoskeletal:     Cervical back: Neck supple.     Right lower leg: No edema.     Left lower leg: No edema.  Skin:    Findings: No erythema or rash.  Neurological:     Mental Status: She is alert and oriented to person, place, and time.     Cranial Nerves: No cranial nerve deficit.     Motor: No abnormal muscle tone.     Coordination: Coordination normal.           Assessment  & Plan:  Controlled type 2 diabetes mellitus with diabetic nephropathy, without long-term current use of insulin (HCC) - Plan: Hemoglobin A1c, CBC with Differential/Platelet, Lipid panel, Microalbumin, urine, COMPLETE METABOLIC PANEL WITH GFR  Stage 3a chronic kidney disease (HCC) - Plan: Hemoglobin A1c, CBC with Differential/Platelet, Lipid panel, Microalbumin, urine, COMPLETE METABOLIC PANEL WITH GFR I believe the weight loss has occurred to drastically and too quickly.  I believe some of the weight loss is likely dehydration.  Therefore discontinue Lasix immediately.  Check fasting lab work.  If creatinine is significantly elevated we may temporarily hold lisinopril to allow her renal function to improve.  If she sees her blood pressure fall below 110/60 I want her to decrease lisinopril.  At the present time she would like to continue the Ozempic because she is happy with the weight loss.  I am hoping some of the nausea and fatigue will improve as the dehydration improves.  Discontinue furosemide

## 2022-05-17 LAB — COMPLETE METABOLIC PANEL WITH GFR
AG Ratio: 1.7 (calc) (ref 1.0–2.5)
ALT: 12 U/L (ref 6–29)
AST: 11 U/L (ref 10–35)
Albumin: 4.9 g/dL (ref 3.6–5.1)
Alkaline phosphatase (APISO): 55 U/L (ref 37–153)
BUN/Creatinine Ratio: 14 (calc) (ref 6–22)
BUN: 24 mg/dL (ref 7–25)
CO2: 26 mmol/L (ref 20–32)
Calcium: 10.3 mg/dL (ref 8.6–10.4)
Chloride: 101 mmol/L (ref 98–110)
Creat: 1.67 mg/dL — ABNORMAL HIGH (ref 0.60–1.00)
Globulin: 2.9 g/dL (calc) (ref 1.9–3.7)
Glucose, Bld: 134 mg/dL — ABNORMAL HIGH (ref 65–99)
Potassium: 5.5 mmol/L — ABNORMAL HIGH (ref 3.5–5.3)
Sodium: 141 mmol/L (ref 135–146)
Total Bilirubin: 0.3 mg/dL (ref 0.2–1.2)
Total Protein: 7.8 g/dL (ref 6.1–8.1)
eGFR: 31 mL/min/{1.73_m2} — ABNORMAL LOW (ref >=60)

## 2022-05-17 LAB — CBC WITH DIFFERENTIAL/PLATELET
Absolute Monocytes: 860 cells/uL (ref 200–950)
Basophils Absolute: 70 cells/uL (ref 0–200)
Basophils Relative: 0.7 %
Eosinophils Absolute: 160 cells/uL (ref 15–500)
Eosinophils Relative: 1.6 %
HCT: 36.5 % (ref 35.0–45.0)
Hemoglobin: 12.5 g/dL (ref 11.7–15.5)
Lymphs Abs: 2100 cells/uL (ref 850–3900)
MCH: 31.4 pg (ref 27.0–33.0)
MCHC: 34.2 g/dL (ref 32.0–36.0)
MCV: 91.7 fL (ref 80.0–100.0)
MPV: 10.7 fL (ref 7.5–12.5)
Monocytes Relative: 8.6 %
Neutro Abs: 6810 cells/uL (ref 1500–7800)
Neutrophils Relative %: 68.1 %
Platelets: 339 10*3/uL (ref 140–400)
RBC: 3.98 10*6/uL (ref 3.80–5.10)
RDW: 12.4 % (ref 11.0–15.0)
Total Lymphocyte: 21 %
WBC: 10 10*3/uL (ref 3.8–10.8)

## 2022-05-17 LAB — HEMOGLOBIN A1C
Hgb A1c MFr Bld: 6.4 % of total Hgb — ABNORMAL HIGH (ref <5.7)
Mean Plasma Glucose: 137 mg/dL
eAG (mmol/L): 7.6 mmol/L

## 2022-05-17 LAB — LIPID PANEL
Cholesterol: 132 mg/dL (ref <200)
HDL: 36 mg/dL — ABNORMAL LOW (ref >=50)
LDL Cholesterol (Calc): 73 mg/dL (calc)
Non-HDL Cholesterol (Calc): 96 mg/dL (calc) (ref <130)
Total CHOL/HDL Ratio: 3.7 (calc) (ref <5.0)
Triglycerides: 147 mg/dL (ref <150)

## 2022-05-17 LAB — MICROALBUMIN, URINE: Microalb, Ur: 1.1 mg/dL

## 2022-05-18 ENCOUNTER — Other Ambulatory Visit: Payer: Self-pay

## 2022-05-18 MED ORDER — SEMAGLUTIDE(0.25 OR 0.5MG/DOS) 2 MG/3ML ~~LOC~~ SOPN: 0.5000 mg | PEN_INJECTOR | SUBCUTANEOUS | 1 refills | Status: DC

## 2022-05-26 ENCOUNTER — Other Ambulatory Visit: Payer: Medicare Other

## 2022-05-26 ENCOUNTER — Other Ambulatory Visit: Payer: Self-pay | Admitting: Family Medicine

## 2022-05-26 DIAGNOSIS — I10 Essential (primary) hypertension: Secondary | ICD-10-CM

## 2022-05-26 DIAGNOSIS — E119 Type 2 diabetes mellitus without complications: Secondary | ICD-10-CM

## 2022-05-26 DIAGNOSIS — E785 Hyperlipidemia, unspecified: Secondary | ICD-10-CM

## 2022-05-26 NOTE — Telephone Encounter (Signed)
Requested medications are due for refill today.  yes  Requested medications are on the active medications list.  yes  Last refill. 10/25/2021 #180 1 refill  Future visit scheduled.   no  Notes to clinic.  Missing and expired labs    Requested Prescriptions  Pending Prescriptions Disp Refills   allopurinol (ZYLOPRIM) 100 MG tablet [Pharmacy Med Name: ALLOPURINOL 100 MG TABLET] 180 tablet 1    Sig: TAKE 2 TABLETS BY MOUTH EVERY DAY     Endocrinology:  Gout Agents - allopurinol Failed - 05/26/2022 11:31 AM      Failed - Uric Acid in normal range and within 360 days    Uric Acid, Serum  Date Value Ref Range Status  05/01/2018 6.3 2.5 - 7.0 mg/dL Final    Comment:    Therapeutic target for gout patients: <6.0 mg/dL .          Failed - Cr in normal range and within 360 days    Creat  Date Value Ref Range Status  05/16/2022 1.67 (H) 0.60 - 1.00 mg/dL Final   Creatinine, Urine  Date Value Ref Range Status  12/14/2020 80 20 - 275 mg/dL Final         Passed - Valid encounter within last 12 months    Recent Outpatient Visits           2 months ago Essential hypertension   [place], Cammie Mcgee, MD   4 months ago Essential hypertension   [place], Cammie Mcgee, MD   6 months ago Essential hypertension   [place], Cammie Mcgee, MD   7 months ago Essential hypertension   [place], Cammie Mcgee, MD   7 months ago Essential hypertension   [place] [facility], Cammie Mcgee, MD              Passed - CBC within normal limits and completed in the last 12 months    WBC  Date Value Ref Range Status  05/16/2022 10.0 3.8 - 10.8 Thousand/uL Final   RBC  Date Value Ref Range Status  05/16/2022 3.98 3.80 - 5.10 Million/uL Final   Hemoglobin  Date Value Ref Range Status  05/16/2022 12.5 11.7 - 15.5 g/dL Final   HCT  Date Value Ref Range Status  05/16/2022 36.5 35.0 -  45.0 % Final   MCHC  Date Value Ref Range Status  05/16/2022 34.2 32.0 - 36.0 g/dL Final   [facility]  Date Value Ref Range Status  05/16/2022 31.4 27.0 - 33.0 pg Final   MCV  Date Value Ref Range Status  05/16/2022 91.7 80.0 - 100.0 fL Final   No results found for: "PLTCOUNTKUC", "LABPLAT", "POCPLA" RDW  Date Value Ref Range Status  05/16/2022 12.4 11.0 - 15.0 % Final

## 2022-05-27 LAB — HEMOGLOBIN A1C
Hgb A1c MFr Bld: 6.3 % of total Hgb — ABNORMAL HIGH (ref <5.7)
Mean Plasma Glucose: 134 mg/dL
eAG (mmol/L): 7.4 mmol/L

## 2022-05-27 LAB — COMPREHENSIVE METABOLIC PANEL
AG Ratio: 1.7 (calc) (ref 1.0–2.5)
ALT: 7 U/L (ref 6–29)
AST: 11 U/L (ref 10–35)
Albumin: 4.4 g/dL (ref 3.6–5.1)
Alkaline phosphatase (APISO): 57 U/L (ref 37–153)
BUN/Creatinine Ratio: 11 (calc) (ref 6–22)
BUN: 14 mg/dL (ref 7–25)
CO2: 27 mmol/L (ref 20–32)
Calcium: 10.3 mg/dL (ref 8.6–10.4)
Chloride: 104 mmol/L (ref 98–110)
Creat: 1.3 mg/dL — ABNORMAL HIGH (ref 0.60–1.00)
Globulin: 2.6 g/dL (calc) (ref 1.9–3.7)
Glucose, Bld: 122 mg/dL — ABNORMAL HIGH (ref 65–99)
Potassium: 5.2 mmol/L (ref 3.5–5.3)
Sodium: 140 mmol/L (ref 135–146)
Total Bilirubin: 0.4 mg/dL (ref 0.2–1.2)
Total Protein: 7 g/dL (ref 6.1–8.1)

## 2022-05-27 LAB — LIPID PANEL
Cholesterol: 127 mg/dL (ref <200)
HDL: 35 mg/dL — ABNORMAL LOW (ref >=50)
LDL Cholesterol (Calc): 70 mg/dL (calc)
Non-HDL Cholesterol (Calc): 92 mg/dL (calc) (ref <130)
Total CHOL/HDL Ratio: 3.6 (calc) (ref <5.0)
Triglycerides: 132 mg/dL (ref <150)

## 2022-06-07 ENCOUNTER — Other Ambulatory Visit: Payer: Self-pay | Admitting: Family Medicine

## 2022-06-07 NOTE — Telephone Encounter (Signed)
Requested Prescriptions  Pending Prescriptions Disp Refills  . levothyroxine (SYNTHROID) 88 MCG tablet [Pharmacy Med Name: Levothyroxine Sodium 88 MCG Oral Tablet] 90 tablet 0    Sig: Take 1 tablet by mouth once daily     Endocrinology:  Hypothyroid Agents Passed - 06/07/2022 10:49 AM      Passed - TSH in normal range and within 360 days    TSH  Date Value Ref Range Status  04/14/2022 3.69 0.40 - 4.50 mIU/L Final         Passed - Valid encounter within last 12 months    Recent Outpatient Visits          2 months ago Essential hypertension   [place], Cammie Mcgee, MD   4 months ago Essential hypertension   [place], Cammie Mcgee, MD   6 months ago Essential hypertension   [place], Cammie Mcgee, MD   8 months ago Essential hypertension   [place], Cammie Mcgee, MD   8 months ago Essential hypertension   [place] [facility], Cammie Mcgee, MD

## 2022-06-12 ENCOUNTER — Other Ambulatory Visit: Payer: Self-pay | Admitting: Family Medicine

## 2022-06-15 ENCOUNTER — Ambulatory Visit: Payer: Medicare Other | Admitting: Family Medicine

## 2022-06-15 VITALS — BP 145/68 | HR 92 | Temp 97.6°F | Ht 63.0 in | Wt 210.0 lb

## 2022-06-15 DIAGNOSIS — N1831 Chronic kidney disease, stage 3a: Secondary | ICD-10-CM

## 2022-06-15 DIAGNOSIS — E1121 Type 2 diabetes mellitus with diabetic nephropathy: Secondary | ICD-10-CM

## 2022-06-15 MED ORDER — FUROSEMIDE 20 MG PO TABS: 20.0000 mg | ORAL_TABLET | Freq: Every day | ORAL | 3 refills | Status: DC

## 2022-06-15 NOTE — Progress Notes (Signed)
Subjective:    Patient ID: Whitney Morales, female    DOB: Mar 17, 1941, 81 y.o.   MRN: [id number]  In 1 month, the patient has lost 16 pounds!  The swelling in her legs has resolved.  She continues to take furosemide.  She is no longer taking Januvia or glipizide.  She is on Ozempic 0.5 mg subcu weekly.  She is battling nausea.  With the drastic sudden weight loss she feels weak and tired.  I am concerned that she has become dehydrated.  Her blood pressure is excellent.  She denies any chest pain or shortness of breath but she does report feeling tired quickly and decreasing stamina however I attribute that to the 16 pound weight loss in less than a month.  At that time, my plan was:  I believe the weight loss has occurred too drastically and too quickly.  I believe some of the weight loss is likely dehydration.  Therefore discontinue Lasix immediately.  Check fasting lab work.  If creatinine is significantly elevated we may temporarily hold lisinopril to allow her renal function to improve.  If she sees her blood pressure fall below 110/60 I want her to decrease lisinopril.  At the present time she would like to continue the Ozempic because she is happy with the weight loss.  I am hoping some of the nausea and fatigue will improve as the dehydration improves.  Discontinue furosemide  06/15/22 Wt Readings from Last 3 Encounters:  06/15/22 210 lb (95.3 kg)  05/16/22 213 lb (96.6 kg)  04/18/22 229 lb (103.9 kg)   The patient's A1c was 6.3 in August.  Her renal function is stable with a creatinine of 1.3.  She is doing better now.  She lost 3 additional pounds since I last saw her although she does have +1 edema in both legs since we discontinue Lasix.  I suspect that she has lost additional weight that has been offset by fluid retention.  This also explains partly the rise in her blood pressure. Past Medical History:  Diagnosis Date   Abnormal finding on EKG 10/29/2012   FOLLOW UP NUCLEAR STRESS TEST  ON 11/05/12 -NORMAL   Corneal erosion 2012   RIGHT EYE--HAS RESOLVED--NO PROBLEMS SINCE   Diabetes mellitus    Diverticulitis    Fatigue    GERD (gastroesophageal reflux disease)    PAST HX--NO PROBLEMS NOW-PT DOES TAKE DAILY TUMS AT BEDTIME   Goiter    cyst vs goiter on thyroid   H/O hiatal hernia    High cholesterol    Hypertension    Obesity    Osteopenia    Past Surgical History:  Procedure Laterality Date   ABDOMINAL HYSTERECTOMY     BREAST LUMPECTOMY     BENIGN   CARDIOVASCULAR STRESS TEST  06/08/2009   EF 78%, NO ISCHEMIA   THYROID LOBECTOMY  11/27/2012   Procedure: THYROID LOBECTOMY;  Surgeon: Ralene Ok, MD;  Location: [facility];  Service: General;  Laterality: Right;  Right Thyroid Lobectomy   TONSILLECTOMY     US ECHOCARDIOGRAPHY  03/23/2006   EF 55-60%   Current Outpatient Medications on File Prior to Visit  Medication Sig Dispense Refill   Accu-Chek Softclix Lancets lancets USE AS DIRECTED TO CHECK SUGAR 100 each 3   allopurinol (ZYLOPRIM) 100 MG tablet TAKE 2 TABLETS BY MOUTH EVERY DAY 180 tablet 1   amLODipine (NORVASC) 10 MG tablet Take 1 tablet (10 mg total) by mouth daily. 90 tablet 3  Blood Glucose Monitoring Suppl (ACCU-CHEK AVIVA PLUS) W/DEVICE KIT PT NEEDS METER-STRIPS(1 BOTTLE =100/5 REFILLS)-LANCETS(1 BOX/5 REFILLS) CHECKS BS QD DX: 250.00 1 kit 0   Calcium Carbonate (CALCIUM 500 PO) Take by mouth.     cetirizine (ZYRTEC) 10 MG tablet Take 1 tablet (10 mg total) by mouth daily. 30 tablet 11   Cholecalciferol (VITAMIN D-3) 125 MCG (5000 UT) TABS Take by mouth.     clotrimazole-betamethasone (LOTRISONE) cream Apply 1 application. topically daily. 30 g 3   colchicine 0.6 MG tablet TAKE 1 TABLET DAILY. TAKE 2 TABS IMMEDIATELY THEN 1 IN AN HOUR IF STILL HURTING 180 tablet 0   glucose blood (ACCU-CHEK AVIVA PLUS) test strip CHECK BLOOD SUGAR TWICE DAILY FASTING IN MORNING, 2 HOURS AFTER A MEAL E11.65O 100 strip 3   levothyroxine (SYNTHROID) 88 MCG tablet Take 1  tablet by mouth once daily 90 tablet 0   lisinopril (ZESTRIL) 20 MG tablet Take 1 tablet (20 mg total) by mouth 2 (two) times daily. 180 tablet 3   metFORMIN (GLUCOPHAGE) 500 MG tablet TAKE TWO TABLETS BY MOUTH TWICE A DAY WITH A MEAL 360 tablet 3   Multiple Vitamin (MULTIVITAMIN WITH MINERALS) TABS Take 1 tablet by mouth daily.     ondansetron (ZOFRAN) 4 MG tablet Take 1 tablet (4 mg total) by mouth every 8 (eight) hours as needed for nausea or vomiting. 20 tablet 0   Semaglutide,0.25 or 0.5MG/DOS, 2 MG/3ML SOPN Inject 0.5 mg into the skin once a week. 3 mL 1   simvastatin (ZOCOR) 80 MG tablet TAKE 1/2 TABLET BY MOUTH EVERY NIGHT AT BEDTIME 45 tablet 2   vitamin B-12 (CYANOCOBALAMIN) 1000 MCG tablet Take 1,000 mcg by mouth daily.     vitamin E 180 MG (400 UNITS) capsule Take 400 Units by mouth daily.     cloNIDine (CATAPRES) 0.1 MG tablet Take 2 tablets (0.2 mg total) by mouth 3 (three) times daily as needed. (Patient not taking: Reported on 06/15/2022) 15 tablet 0   No current facility-administered medications on file prior to visit.   Allergies  Allergen Reactions   Clindamycin/Lincomycin     CLINDAMYCIN TAKEN WITH CIPRO CAUSED SEVERE NAUSEA--PT STATES SHE CAN TAKE CIPRO ALONE WITHOUT PROBLEM--IT WAS JUST THE COMBINATION SHE DID NOT TOLERATE.   Levaquin [Levofloxacin] Nausea And Vomiting   Metronidazole Hcl Other (See Comments)    Deathly sick WHEN PT TOOK METRONIDAZOLE WITH CIPRO.  PT STATES SHE HAS TAKEN CIPRO ALONE WITHOUT PROBLEM --JUST DID NOT TOLERATE THE COMBINATION OF BOTH DRUGS TAKEN TOGETHER.   Social History   Socioeconomic History   Marital status: Married    Spouse name: Sonia Side   Number of children: 2   Years of education: Not on file   Highest education level: Not on file  Occupational History   Not on file  Tobacco Use   Smoking status: Never   Smokeless tobacco: Never  Substance and Sexual Activity   Alcohol use: No   Drug use: No   Sexual activity: Not Currently   Other Topics Concern   Not on file  Social History Narrative   2 sons   Lynnette Caffey and great grandchildren   Social Determinants of Health   Financial Resource Strain: Low Risk  (01/12/2022)   Overall Financial Resource Strain (CARDIA)    Difficulty of Paying Living Expenses: Not hard at all  Food Insecurity: No Food Insecurity (01/12/2022)   Hunger Vital Sign    Worried About Running Out of Food in the Last  Year: Never true    [place] in the Last Year: Never true  Transportation Needs: No Transportation Needs (01/12/2022)   PRAPARE - Hydrologist (Medical): No    Lack of Transportation (Non-Medical): No  Physical Activity: Sufficiently Active (01/12/2022)   Exercise Vital Sign    Days of Exercise per Week: 7 days    Minutes of Exercise per Session: 30 min  Stress: No Stress Concern Present (01/12/2022)   [place]    Feeling of Stress : Not at all  Social Connections: Moderately Integrated (01/12/2022)   Social Connection and Isolation Panel [NHANES]    Frequency of Communication with Friends and Family: More than three times a week    Frequency of Social Gatherings with Friends and Family: More than three times a week    Attends Religious Services: 1 to 4 times per year    Active Member of [employer] or Organizations: No    Attends Archivist Meetings: Never    Marital Status: Married  Human resources officer Violence: Not At Risk (01/12/2022)   Humiliation, Afraid, Rape, and Kick questionnaire    Fear of Current or Ex-Partner: No    Emotionally Abused: No    Physically Abused: No    Sexually Abused: No   No family history on file.     Review of Systems  All other systems reviewed and are negative.      Objective:   Physical Exam Vitals reviewed.  Constitutional:      General: She is not in acute distress.    Appearance: She is well-developed. She is not  diaphoretic.  HENT:     Right Ear: External ear normal.     Left Ear: External ear normal.     Nose: Nose normal.  Neck:     Thyroid: No thyromegaly.     Vascular: No JVD.  Cardiovascular:     Rate and Rhythm: Normal rate and regular rhythm.     Heart sounds: Normal heart sounds. No murmur heard. Pulmonary:     Effort: Pulmonary effort is normal. No respiratory distress.     Breath sounds: Normal breath sounds. No wheezing or rales.  Abdominal:     General: Bowel sounds are normal. There is no distension.     Palpations: Abdomen is soft. There is no mass.     Tenderness: There is no abdominal tenderness. There is no guarding or rebound.  Musculoskeletal:     Cervical back: Neck supple.     Right lower leg: Edema present.     Left lower leg: Edema present.  Skin:    Findings: No erythema or rash.  Neurological:     Mental Status: She is alert and oriented to person, place, and time.     Cranial Nerves: No cranial nerve deficit.     Motor: No abnormal muscle tone.     Coordination: Coordination normal.           Assessment & Plan:  Controlled type 2 diabetes mellitus with diabetic nephropathy, without long-term current use of insulin (HCC)  Stage 3a chronic kidney disease ([place]) We discussed increasing Ozempic to 1 mg but she continues to lose weight and is doing good even on the lower dose.  She does not want to increase the dose due to the nausea at the present time.  I will add back Lasix 20 mg a day given the swelling in her  legs but I cautioned her to watch.  If she starts to become dehydrated she will need to stop the Lasix.

## 2022-07-10 ENCOUNTER — Ambulatory Visit
Admission: RE | Admit: 2022-07-10 | Discharge: 2022-07-10 | Disposition: A | Discharge status: Home / Self Care | Payer: Medicare Other | Source: Ambulatory Visit | Attending: Family Medicine | Admitting: Family Medicine

## 2022-07-10 DIAGNOSIS — Z13820 Encounter for screening for osteoporosis: Secondary | ICD-10-CM

## 2022-07-13 ENCOUNTER — Encounter: Payer: Self-pay | Admitting: Family Medicine

## 2022-07-13 ENCOUNTER — Other Ambulatory Visit: Payer: Self-pay

## 2022-07-13 ENCOUNTER — Ambulatory Visit: Payer: Medicare Other | Admitting: Family Medicine

## 2022-07-13 DIAGNOSIS — E1121 Type 2 diabetes mellitus with diabetic nephropathy: Secondary | ICD-10-CM

## 2022-07-13 MED ORDER — SEMAGLUTIDE(0.25 OR 0.5MG/DOS) 2 MG/3ML ~~LOC~~ SOPN: 0.5000 mg | PEN_INJECTOR | SUBCUTANEOUS | 1 refills | Status: DC

## 2022-08-07 LAB — HM DIABETES EYE EXAM

## 2022-08-14 ENCOUNTER — Telehealth: Payer: Self-pay

## 2022-08-14 NOTE — Telephone Encounter (Signed)
VACCINE QUESTION: Pt called and asks if you recommend the new Covid vaccine for herself and her husband? Thank you.

## 2022-09-04 ENCOUNTER — Ambulatory Visit (LOCAL_COMMUNITY_HEALTH_CENTER): Payer: Medicare Other

## 2022-09-04 DIAGNOSIS — Z23 Encounter for immunization: Secondary | ICD-10-CM | POA: Diagnosis not present

## 2022-09-04 DIAGNOSIS — Z719 Counseling, unspecified: Secondary | ICD-10-CM

## 2022-09-04 NOTE — Progress Notes (Signed)
  Are you feeling sick today? No   Have you ever received a dose of COVID-19 Vaccine? [employer], [place], [place], [place], Other) Yes  If yes, which vaccine and how many doses?   5 doses Pfizer   Did you bring the vaccination record card or other documentation?  Yes   Do you have a health condition or are undergoing treatment that makes you moderately or severely immunocompromised? This would include, but not be limited to: cancer, HIV, organ transplant, immunosuppressive therapy/high-dose corticosteroids, or moderate/severe primary immunodeficiency.  No  Have you received COVID-19 vaccine before or during hematopoietic cell transplant (HCT) or CAR-T-cell therapies? No  Have you ever had an allergic reaction to: (This would include a severe allergic reaction or a reaction that caused hives, swelling, or respiratory distress, including wheezing.) A component of a COVID-19 vaccine or a previous dose of COVID-19 vaccine? No   Have you ever had an allergic reaction to another vaccine (other thanCOVID-19 vaccine) or an injectable medication? (This would include a severe allergic reaction or a reaction that caused hives, swelling, or respiratory distress, including wheezing.)   No    Do you have a history of any of the following:  Myocarditis or Pericarditis No  Dermal fillers:  No  Multisystem Inflammatory Syndrome (MIS-C or MIS-A)? No  COVID-19 disease within the past 3 months? No  Vaccinated with monkeypox vaccine in the last 4 weeks? No   VIS provided.  Comirnaty +12Y given IM in left deltoid.  Tolerated well. COVID card updated and [facility] updated and copy provided. Waited 15 minutes.

## 2022-09-05 ENCOUNTER — Encounter: Payer: Self-pay | Admitting: Family Medicine

## 2022-09-05 ENCOUNTER — Ambulatory Visit: Payer: Medicare Other | Admitting: Family Medicine

## 2022-09-05 VITALS — BP 126/64 | HR 88 | Ht 63.0 in | Wt 203.0 lb

## 2022-09-05 DIAGNOSIS — E1121 Type 2 diabetes mellitus with diabetic nephropathy: Secondary | ICD-10-CM

## 2022-09-05 DIAGNOSIS — N1831 Chronic kidney disease, stage 3a: Secondary | ICD-10-CM | POA: Diagnosis not present

## 2022-09-05 DIAGNOSIS — E038 Other specified hypothyroidism: Secondary | ICD-10-CM

## 2022-09-05 DIAGNOSIS — I10 Essential (primary) hypertension: Secondary | ICD-10-CM | POA: Diagnosis not present

## 2022-09-05 MED ORDER — SEMAGLUTIDE (1 MG/DOSE) 4 MG/3ML ~~LOC~~ SOPN: 1.0000 mg | PEN_INJECTOR | SUBCUTANEOUS | 3 refills | Status: DC

## 2022-09-05 NOTE — Progress Notes (Signed)
Subjective:    Patient ID: Whitney Morales, female    DOB: May 28, 1941, 81 y.o.   MRN: [id number]  Wt Readings from Last 3 Encounters:  09/05/22 203 lb (92.1 kg)  06/15/22 210 lb (95.3 kg)  05/16/22 213 lb (96.6 kg)  Patient is here today for a checkup.  She continues to lose weight.  Her weight has fallen from 210 pounds to 203 pounds.  I congratulated her on this.  Her blood pressure here is excellent at 126/64 but she states that at home her blood pressures typically over 270 systolic.  Given her chronic kidney disease I would like to see her systolic blood pressure in the 130 range and her diastolic blood pressures in the 80s.  She denies any chest pain shortness of breath or dyspnea on exertion.  Her fasting blood sugars have been less than 130.  She denies any hypoglycemic episodes.  She is tolerating 0.5 mg of Ozempic without any difficulty.  Past Medical History:  Diagnosis Date   Abnormal finding on EKG 10/29/2012   FOLLOW UP NUCLEAR STRESS TEST ON 11/05/12 -NORMAL   Corneal erosion 2012   RIGHT EYE--HAS RESOLVED--NO PROBLEMS SINCE   Diabetes mellitus    Diverticulitis    Fatigue    GERD (gastroesophageal reflux disease)    PAST HX--NO PROBLEMS NOW-PT DOES TAKE DAILY TUMS AT BEDTIME   Goiter    cyst vs goiter on thyroid   H/O hiatal hernia    High cholesterol    Hypertension    Obesity    Osteopenia    Past Surgical History:  Procedure Laterality Date   ABDOMINAL HYSTERECTOMY     BREAST LUMPECTOMY     BENIGN   CARDIOVASCULAR STRESS TEST  06/08/2009   EF 78%, NO ISCHEMIA   THYROID LOBECTOMY  11/27/2012   Procedure: THYROID LOBECTOMY;  Surgeon: Ralene Ok, MD;  Location: [facility];  Service: General;  Laterality: Right;  Right Thyroid Lobectomy   TONSILLECTOMY     US ECHOCARDIOGRAPHY  03/23/2006   EF 55-60%   Current Outpatient Medications on File Prior to Visit  Medication Sig Dispense Refill   Accu-Chek Softclix Lancets lancets USE AS DIRECTED TO CHECK SUGAR 100 each  3   allopurinol (ZYLOPRIM) 100 MG tablet TAKE 2 TABLETS BY MOUTH EVERY DAY 180 tablet 1   amLODipine (NORVASC) 10 MG tablet Take 1 tablet (10 mg total) by mouth daily. 90 tablet 3   Blood Glucose Monitoring Suppl (ACCU-CHEK AVIVA PLUS) W/DEVICE KIT PT NEEDS METER-STRIPS(1 BOTTLE =100/5 REFILLS)-LANCETS(1 BOX/5 REFILLS) CHECKS BS QD DX: 250.00 1 kit 0   Calcium Carbonate (CALCIUM 500 PO) Take by mouth.     cetirizine (ZYRTEC) 10 MG tablet Take 1 tablet (10 mg total) by mouth daily. 30 tablet 11   Cholecalciferol (VITAMIN D-3) 125 MCG (5000 UT) TABS Take by mouth.     cloNIDine (CATAPRES) 0.1 MG tablet Take 2 tablets (0.2 mg total) by mouth 3 (three) times daily as needed. 15 tablet 0   clotrimazole-betamethasone (LOTRISONE) cream Apply 1 application. topically daily. 30 g 3   colchicine 0.6 MG tablet TAKE 1 TABLET DAILY. TAKE 2 TABS IMMEDIATELY THEN 1 IN AN HOUR IF STILL HURTING 180 tablet 0   furosemide (LASIX) 20 MG tablet Take 1 tablet (20 mg total) by mouth daily. 30 tablet 3   glucose blood (ACCU-CHEK AVIVA PLUS) test strip CHECK BLOOD SUGAR TWICE DAILY FASTING IN MORNING, 2 HOURS AFTER A MEAL E11.65O 100 strip 3  levothyroxine (SYNTHROID) 88 MCG tablet Take 1 tablet by mouth once daily 90 tablet 0   lisinopril (ZESTRIL) 20 MG tablet Take 1 tablet (20 mg total) by mouth 2 (two) times daily. 180 tablet 3   metFORMIN (GLUCOPHAGE) 500 MG tablet TAKE TWO TABLETS BY MOUTH TWICE A DAY WITH A MEAL 360 tablet 3   Multiple Vitamin (MULTIVITAMIN WITH MINERALS) TABS Take 1 tablet by mouth daily.     ondansetron (ZOFRAN) 4 MG tablet Take 1 tablet (4 mg total) by mouth every 8 (eight) hours as needed for nausea or vomiting. 20 tablet 0   Semaglutide,0.25 or 0.5MG/DOS, 2 MG/3ML SOPN Inject 0.5 mg into the skin once a week. 3 mL 1   simvastatin (ZOCOR) 80 MG tablet TAKE 1/2 TABLET BY MOUTH EVERY NIGHT AT BEDTIME 45 tablet 2   vitamin B-12 (CYANOCOBALAMIN) 1000 MCG tablet Take 1,000 mcg by mouth daily.      vitamin E 180 MG (400 UNITS) capsule Take 400 Units by mouth daily.     No current facility-administered medications on file prior to visit.   Allergies  Allergen Reactions   Clindamycin/Lincomycin     CLINDAMYCIN TAKEN WITH CIPRO CAUSED SEVERE NAUSEA--PT STATES SHE CAN TAKE CIPRO ALONE WITHOUT PROBLEM--IT WAS JUST THE COMBINATION SHE DID NOT TOLERATE.   Levaquin [Levofloxacin] Nausea And Vomiting   Metronidazole Hcl Other (See Comments)    Deathly sick WHEN PT TOOK METRONIDAZOLE WITH CIPRO.  PT STATES SHE HAS TAKEN CIPRO ALONE WITHOUT PROBLEM --JUST DID NOT TOLERATE THE COMBINATION OF BOTH DRUGS TAKEN TOGETHER.   Social History   Socioeconomic History   Marital status: Married    Spouse name: Sonia Side   Number of children: 2   Years of education: Not on file   Highest education level: Not on file  Occupational History   Not on file  Tobacco Use   Smoking status: Never   Smokeless tobacco: Never  Substance and Sexual Activity   Alcohol use: No   Drug use: No   Sexual activity: Not Currently  Other Topics Concern   Not on file  Social History Narrative   2 sons   Lynnette Caffey and great grandchildren   Social Determinants of Health   Financial Resource Strain: Low Risk  (01/12/2022)   Overall Financial Resource Strain (CARDIA)    Difficulty of Paying Living Expenses: Not hard at all  Food Insecurity: No Food Insecurity (01/12/2022)   Hunger Vital Sign    Worried About Running Out of Food in the Last Year: Never true    Ran Out of Food in the Last Year: Never true  Transportation Needs: No Transportation Needs (01/12/2022)   PRAPARE - Hydrologist (Medical): No    Lack of Transportation (Non-Medical): No  Physical Activity: Sufficiently Active (01/12/2022)   Exercise Vital Sign    Days of Exercise per Week: 7 days    Minutes of Exercise per Session: 30 min  Stress: No Stress Concern Present (01/12/2022)   [place]    Feeling of Stress : Not at all  Social Connections: Moderately Integrated (01/12/2022)   Social Connection and Isolation Panel [NHANES]    Frequency of Communication with Friends and Family: More than three times a week    Frequency of Social Gatherings with Friends and Family: More than three times a week    Attends Religious Services: 1 to 4 times per year    Active Member  of Clubs or Organizations: No    Attends Archivist Meetings: Never    Marital Status: Married  Human resources officer Violence: Not At Risk (01/12/2022)   Humiliation, Afraid, Rape, and Kick questionnaire    Fear of Current or Ex-Partner: No    Emotionally Abused: No    Physically Abused: No    Sexually Abused: No   No family history on file.     Review of Systems  All other systems reviewed and are negative.      Objective:   Physical Exam Vitals reviewed.  Constitutional:      General: She is not in acute distress.    Appearance: She is well-developed. She is not diaphoretic.  HENT:     Right Ear: External ear normal.     Left Ear: External ear normal.     Nose: Nose normal.  Neck:     Thyroid: No thyromegaly.     Vascular: No JVD.  Cardiovascular:     Rate and Rhythm: Normal rate and regular rhythm.     Heart sounds: Normal heart sounds. No murmur heard. Pulmonary:     Effort: Pulmonary effort is normal. No respiratory distress.     Breath sounds: Normal breath sounds. No wheezing or rales.  Abdominal:     General: Bowel sounds are normal. There is no distension.     Palpations: Abdomen is soft. There is no mass.     Tenderness: There is no abdominal tenderness. There is no guarding or rebound.  Musculoskeletal:     Cervical back: Neck supple.     Right lower leg: Edema present.     Left lower leg: Edema present.  Skin:    Findings: No erythema or rash.  Neurological:     Mental Status: She is alert and oriented to person, place, and time.      Cranial Nerves: No cranial nerve deficit.     Motor: No abnormal muscle tone.     Coordination: Coordination normal.           Assessment & Plan:  Controlled type 2 diabetes mellitus with diabetic nephropathy, without long-term current use of insulin (HCC) - Plan: Hemoglobin A1c, CBC with Differential/Platelet, Lipid panel, COMPLETE METABOLIC PANEL WITH GFR, Protein / Creatinine Ratio, Urine  Stage 3a chronic kidney disease ([place])  Essential hypertension  Other specified hypothyroidism - Plan: TSH I am proud of the patient for losing weight.  I asked her to check her blood pressures at home over the next week and record those values.  If her blood pressure is greater then 818 systolic I would recommend increasing medication to lower her blood pressure due to her chronic kidney disease.  Return fasting so that we can check a hemoglobin A1c, CBC, lipid panel, CMP, and a urine protein to creatinine ratio.  If there is an elevated protein to creatinine ratio I would recommend adding Farxiga to help reduce her risk of progression of diabetic kidney damage.  Also check a TSH to evaluate the management of her hypothyroidism.  Patient is already had her flu shot and COVID booster.  We discussed the pros and cons of the RSV vaccination

## 2022-09-07 ENCOUNTER — Other Ambulatory Visit: Payer: Self-pay | Admitting: Family Medicine

## 2022-09-15 ENCOUNTER — Other Ambulatory Visit: Payer: Self-pay | Admitting: Family Medicine

## 2022-09-19 ENCOUNTER — Other Ambulatory Visit: Payer: Medicare Other

## 2022-09-20 ENCOUNTER — Other Ambulatory Visit: Payer: Self-pay

## 2022-09-20 DIAGNOSIS — E038 Other specified hypothyroidism: Secondary | ICD-10-CM

## 2022-09-20 LAB — CBC WITH DIFFERENTIAL/PLATELET
Absolute Monocytes: 718 cells/uL (ref 200–950)
Basophils Absolute: 101 cells/uL (ref 0–200)
Basophils Relative: 1.1 %
Eosinophils Absolute: 230 cells/uL (ref 15–500)
Eosinophils Relative: 2.5 %
HCT: 37.5 % (ref 35.0–45.0)
Hemoglobin: 12.6 g/dL (ref 11.7–15.5)
Lymphs Abs: 2199 cells/uL (ref 850–3900)
MCH: 31.6 pg (ref 27.0–33.0)
MCHC: 33.6 g/dL (ref 32.0–36.0)
MCV: 94 fL (ref 80.0–100.0)
MPV: 10.2 fL (ref 7.5–12.5)
Monocytes Relative: 7.8 %
Neutro Abs: 5952 cells/uL (ref 1500–7800)
Neutrophils Relative %: 64.7 %
Platelets: 335 10*3/uL (ref 140–400)
RBC: 3.99 10*6/uL (ref 3.80–5.10)
RDW: 12.7 % (ref 11.0–15.0)
Total Lymphocyte: 23.9 %
WBC: 9.2 10*3/uL (ref 3.8–10.8)

## 2022-09-20 LAB — LIPID PANEL
Cholesterol: 142 mg/dL (ref <200)
HDL: 39 mg/dL — ABNORMAL LOW (ref >=50)
LDL Cholesterol (Calc): 78 mg/dL (calc)
Non-HDL Cholesterol (Calc): 103 mg/dL (calc) (ref <130)
Total CHOL/HDL Ratio: 3.6 (calc) (ref <5.0)
Triglycerides: 149 mg/dL (ref <150)

## 2022-09-20 LAB — COMPLETE METABOLIC PANEL WITH GFR
AG Ratio: 1.6 (calc) (ref 1.0–2.5)
ALT: 10 U/L (ref 6–29)
AST: 14 U/L (ref 10–35)
Albumin: 4.3 g/dL (ref 3.6–5.1)
Alkaline phosphatase (APISO): 52 U/L (ref 37–153)
BUN/Creatinine Ratio: 14 (calc) (ref 6–22)
BUN: 15 mg/dL (ref 7–25)
CO2: 26 mmol/L (ref 20–32)
Calcium: 10 mg/dL (ref 8.6–10.4)
Chloride: 105 mmol/L (ref 98–110)
Creat: 1.08 mg/dL — ABNORMAL HIGH (ref 0.60–1.00)
Globulin: 2.7 g/dL (calc) (ref 1.9–3.7)
Glucose, Bld: 105 mg/dL — ABNORMAL HIGH (ref 65–99)
Potassium: 4.9 mmol/L (ref 3.5–5.3)
Sodium: 142 mmol/L (ref 135–146)
Total Bilirubin: 0.3 mg/dL (ref 0.2–1.2)
Total Protein: 7 g/dL (ref 6.1–8.1)
eGFR: 52 mL/min/{1.73_m2} — ABNORMAL LOW (ref >=60)

## 2022-09-20 LAB — HEMOGLOBIN A1C
Hgb A1c MFr Bld: 6.4 % of total Hgb — ABNORMAL HIGH (ref <5.7)
Mean Plasma Glucose: 137 mg/dL
eAG (mmol/L): 7.6 mmol/L

## 2022-09-20 LAB — PROTEIN / CREATININE RATIO, URINE
Creatinine, Urine: 225 mg/dL (ref 20–275)
Protein/Creat Ratio: 93 mg/g creat (ref 24–184)
Protein/Creatinine Ratio: 0.093 mg/mg creat (ref 0.024–0.184)
Total Protein, Urine: 21 mg/dL (ref 5–24)

## 2022-09-20 LAB — TSH: TSH: 1.95 mIU/L (ref 0.40–4.50)

## 2022-09-20 MED ORDER — LEVOTHYROXINE SODIUM 88 MCG PO TABS: 88.0000 ug | ORAL_TABLET | Freq: Every day | ORAL | 3 refills | Status: DC

## 2022-09-26 ENCOUNTER — Other Ambulatory Visit: Payer: Self-pay

## 2022-09-26 ENCOUNTER — Telehealth: Payer: Self-pay | Admitting: Family Medicine

## 2022-09-26 DIAGNOSIS — N1831 Chronic kidney disease, stage 3a: Secondary | ICD-10-CM

## 2022-09-26 DIAGNOSIS — E038 Other specified hypothyroidism: Secondary | ICD-10-CM

## 2022-09-26 DIAGNOSIS — I10 Essential (primary) hypertension: Secondary | ICD-10-CM

## 2022-09-26 MED ORDER — AMLODIPINE BESYLATE 10 MG PO TABS: 10.0000 mg | ORAL_TABLET | Freq: Every day | ORAL | 3 refills | Status: DC

## 2022-09-26 NOTE — Telephone Encounter (Signed)
Prescription Request  09/26/2022  Is this a "Controlled Substance" medicine? No  LOV: 09/05/2022  What is the name of the medication or equipment?   amLODipine (NORVASC) 10 MG tablet   Have you contacted your pharmacy to request a refill? Yes   Which pharmacy would you like this sent to?  [id number]- [place], [place] - [id number] [id number]: [phone]: [phone]   Patient notified that their request is being sent to the clinical staff for review and that they should receive a response within 2 business days.   Please advise patient when refill sent in at [phone] Patient stated pharmacy has reached out to [facility] to request refill; patient only has one pill left. Patient also stated she's having some issues managing her blood pressure.

## 2022-10-06 ENCOUNTER — Telehealth: Payer: Self-pay | Admitting: Pharmacist

## 2022-10-06 NOTE — Progress Notes (Signed)
[place] Team  Medication Adherence Quality Measures  10/06/22  Whitney Morales [birth year]  Provider:  Dr. Dennard Schaumann  Practice:  [facility]  Medication Adherence Measure(s):   Medication Adherence for Cholesterol Medication  Medication Adherence Findings:  [name] is late to fill Simvastatin 80 mg. Refill past due as of 08/24/2022 and absolute fail date is 10/08/2022. Placed call to patient and left HIPAA compliant voice mail for patient to return my call. Will also send patient a MyChart message.   Loretha Brasil, PharmD [place] Pharmacist Office: [phone]

## 2022-10-09 ENCOUNTER — Telehealth: Payer: Self-pay

## 2022-10-09 ENCOUNTER — Other Ambulatory Visit: Payer: Self-pay

## 2022-10-09 DIAGNOSIS — I10 Essential (primary) hypertension: Secondary | ICD-10-CM

## 2022-10-09 MED ORDER — NEBIVOLOL HCL 5 MG PO TABS: 5.0000 mg | ORAL_TABLET | Freq: Every day | ORAL | 3 refills | Status: DC

## 2022-10-09 NOTE — Telephone Encounter (Signed)
I reviewed her bp and they are higher than we like.  Add bystolic 5 mg poqday to her other meds and recheck in 1 montn.

## 2022-10-30 ENCOUNTER — Telehealth: Payer: Self-pay | Admitting: Family Medicine

## 2022-10-30 ENCOUNTER — Other Ambulatory Visit: Payer: Self-pay

## 2022-10-30 ENCOUNTER — Other Ambulatory Visit: Payer: Self-pay | Admitting: Family Medicine

## 2022-10-30 DIAGNOSIS — I10 Essential (primary) hypertension: Secondary | ICD-10-CM

## 2022-10-30 MED ORDER — LISINOPRIL 20 MG PO TABS: 20.0000 mg | ORAL_TABLET | Freq: Two times a day (BID) | ORAL | 3 refills | Status: DC

## 2022-10-30 NOTE — Telephone Encounter (Signed)
Prescription Request  10/30/2022  Is this a "Controlled Substance" medicine? No  LOV: 09/05/2022  What is the name of the medication or equipment?   lisinopril (ZESTRIL) 20 MG tablet   Have you contacted your pharmacy to request a refill? Yes   Which pharmacy would you like this sent to?  [id number]- [place], [place] - [address] [id number]: [phone]: [phone]   Patient notified that their request is being sent to the clinical staff for review and that they should receive a response within 2 business days.   Please advise pharmacist.

## 2022-10-31 ENCOUNTER — Other Ambulatory Visit: Payer: Self-pay | Admitting: Family Medicine

## 2022-10-31 NOTE — Telephone Encounter (Signed)
Last RF 05/26/22 #180 1 RF- pt has upcoming appt  Requested Prescriptions  Refused Prescriptions Disp Refills   allopurinol (ZYLOPRIM) 100 MG tablet [Pharmacy Med Name: ALLOPURINOL 100 MG TABLET] 180 tablet 1    Sig: TAKE 2 TABLETS BY [facility]     Endocrinology:  Gout Agents - allopurinol Failed - 10/30/2022  9:15 AM      Failed - Uric Acid in normal range and within 360 days    Uric Acid, Serum  Date Value Ref Range Status  05/01/2018 6.3 2.5 - 7.0 mg/dL Final    Comment:    Therapeutic target for gout patients: <6.0 mg/dL .          Failed - Cr in normal range and within 360 days    Creat  Date Value Ref Range Status  09/19/2022 1.08 (H) 0.60 - 1.00 mg/dL Final   Creatinine, Urine  Date Value Ref Range Status  09/19/2022 225 20 - 275 mg/dL Final         Passed - Valid encounter within last 12 months    Recent Outpatient Visits           7 months ago Essential hypertension   [place] Pickard, Cammie Mcgee, MD   9 months ago Essential hypertension   [place], Cammie Mcgee, MD   11 months ago Essential hypertension   [place] Pickard, Cammie Mcgee, MD   1 year ago Essential hypertension   [place] Pickard, Cammie Mcgee, MD   1 year ago Essential hypertension   [place], Cammie Mcgee, MD       Future Appointments             In 1 week [place] Schaumann, Cammie Mcgee, MD [place]            Passed - CBC within normal limits and completed in the last 12 months    WBC  Date Value Ref Range Status  09/19/2022 9.2 3.8 - 10.8 Thousand/uL Final   RBC  Date Value Ref Range Status  09/19/2022 3.99 3.80 - 5.10 Million/uL Final   Hemoglobin  Date Value Ref Range Status  09/19/2022 12.6 11.7 - 15.5 g/dL Final   HCT  Date Value Ref Range Status  09/19/2022 37.5 35.0 - 45.0 % Final   MCHC  Date Value Ref Range Status  09/19/2022 33.6 32.0 - 36.0 g/dL  Final   [facility]  Date Value Ref Range Status  09/19/2022 31.6 27.0 - 33.0 pg Final   MCV  Date Value Ref Range Status  09/19/2022 94.0 80.0 - 100.0 fL Final   No results found for: "PLTCOUNTKUC", "LABPLAT", "POCPLA" RDW  Date Value Ref Range Status  09/19/2022 12.7 11.0 - 15.0 % Final         '

## 2022-11-06 ENCOUNTER — Telehealth: Payer: Self-pay | Admitting: Family Medicine

## 2022-11-06 NOTE — Telephone Encounter (Signed)
Patient tested positive for COVID this morning; last night had severe congestion and wasn't able to sleep. Other sx: very tired, sneezing, coughing, lots of phlegm.  Patient requesting for provider to call in medication to her pharmacy.    Pharmacy confirmed as  [id number] [place] [id number] [place]Phone: [phone] Fax: [phone] #: [id number]  Please advise patient at [phone]

## 2022-11-07 ENCOUNTER — Other Ambulatory Visit: Payer: Self-pay | Admitting: Family Medicine

## 2022-11-07 MED ORDER — NIRMATRELVIR/RITONAVIR (PAXLOVID)TABLET: 3.0000 | ORAL_TABLET | Freq: Two times a day (BID) | ORAL | 0 refills | Status: AC

## 2022-11-07 NOTE — Telephone Encounter (Signed)
Patient called and is aware script sent in. Advised patient provider wants her to hold off on taking simvastatin. Patient verbalized understanding.

## 2022-11-09 ENCOUNTER — Ambulatory Visit: Payer: Medicare Other | Admitting: Family Medicine

## 2022-11-10 ENCOUNTER — Encounter: Payer: Self-pay | Admitting: Family Medicine

## 2022-11-21 ENCOUNTER — Other Ambulatory Visit: Payer: Self-pay | Admitting: Family Medicine

## 2022-11-26 ENCOUNTER — Other Ambulatory Visit: Payer: Self-pay | Admitting: Family Medicine

## 2022-11-30 ENCOUNTER — Encounter: Payer: Self-pay | Admitting: Family Medicine

## 2022-11-30 ENCOUNTER — Ambulatory Visit: Payer: Medicare Other | Admitting: Family Medicine

## 2022-11-30 VITALS — BP 132/64 | HR 77 | Temp 97.8°F | Ht 63.0 in | Wt 189.0 lb

## 2022-11-30 DIAGNOSIS — E1121 Type 2 diabetes mellitus with diabetic nephropathy: Secondary | ICD-10-CM

## 2022-11-30 DIAGNOSIS — E038 Other specified hypothyroidism: Secondary | ICD-10-CM

## 2022-11-30 DIAGNOSIS — E7800 Pure hypercholesterolemia, unspecified: Secondary | ICD-10-CM | POA: Diagnosis not present

## 2022-11-30 NOTE — Progress Notes (Signed)
Subjective:    Patient ID: Whitney Morales, female    DOB: Jul 10, 1941, 82 y.o.   MRN: [id number]  Wt Readings from Last 3 Encounters:  11/30/22 189 lb (85.7 kg)  09/05/22 203 lb (92.1 kg)  06/15/22 210 lb (95.3 kg)  I am extremely proud of this patient.  She continues to lose weight.  Since her last visit, she has lost another 14 pounds.  Her blood pressures are outstanding.  Her systolic blood pressure is averaging between 120 and 130.  Her diastolic blood pressures averaging between 60 and 75.  She denies any hypotensive episodes.  Her fasting blood sugars are very consistent in between 80 and 100.  She denies any hypoglycemic episodes.  She does have fatigue and I question if that could be due to the weight loss or the addition of nebivolol.  She also recently had COVID in January.  We discussed holding the nebivolol however she would like to continue it at the present time.  Her mammogram is coming due this month.  Her bone density is up-to-date and she does not require colonoscopy Past Medical History:  Diagnosis Date   Abnormal finding on EKG 10/29/2012   FOLLOW UP NUCLEAR STRESS TEST ON 11/05/12 -NORMAL   Corneal erosion 2012   RIGHT EYE--HAS RESOLVED--NO PROBLEMS SINCE   Diabetes mellitus    Diverticulitis    Fatigue    GERD (gastroesophageal reflux disease)    PAST HX--NO PROBLEMS NOW-PT DOES TAKE DAILY TUMS AT BEDTIME   Goiter    cyst vs goiter on thyroid   H/O hiatal hernia    High cholesterol    Hypertension    Obesity    Osteopenia    Past Surgical History:  Procedure Laterality Date   ABDOMINAL HYSTERECTOMY     BREAST LUMPECTOMY     BENIGN   CARDIOVASCULAR STRESS TEST  06/08/2009   EF 78%, NO ISCHEMIA   THYROID LOBECTOMY  11/27/2012   Procedure: THYROID LOBECTOMY;  Surgeon: Ralene Ok, MD;  Location: [facility];  Service: General;  Laterality: Right;  Right Thyroid Lobectomy   TONSILLECTOMY     US ECHOCARDIOGRAPHY  03/23/2006   EF 55-60%   Current Outpatient  Medications on File Prior to Visit  Medication Sig Dispense Refill   Accu-Chek Softclix Lancets lancets USE AS DIRECTED TO CHECK SUGAR 100 each 3   allopurinol (ZYLOPRIM) 100 MG tablet TAKE 2 TABLETS BY MOUTH EVERY DAY 120 tablet 0   amLODipine (NORVASC) 10 MG tablet Take 1 tablet (10 mg total) by mouth daily. 90 tablet 3   Blood Glucose Monitoring Suppl (ACCU-CHEK AVIVA PLUS) W/DEVICE KIT PT NEEDS METER-STRIPS(1 BOTTLE =100/5 REFILLS)-LANCETS(1 BOX/5 REFILLS) CHECKS BS QD DX: 250.00 1 kit 0   Calcium Carbonate (CALCIUM 500 PO) Take by mouth.     Cholecalciferol (VITAMIN D-3) 125 MCG (5000 UT) TABS Take by mouth.     cloNIDine (CATAPRES) 0.1 MG tablet Take 2 tablets (0.2 mg total) by mouth 3 (three) times daily as needed. 15 tablet 0   furosemide (LASIX) 20 MG tablet Take 1 tablet (20 mg total) by mouth daily. 30 tablet 3   glucose blood (ACCU-CHEK AVIVA PLUS) test strip CHECK BLOOD SUGAR TWICE DAILY FASTING IN MORNING, 2 HOURS AFTER A MEAL E11.65O 100 strip 3   levothyroxine (SYNTHROID) 88 MCG tablet Take 1 tablet (88 mcg total) by mouth daily. 90 tablet 3   lisinopril (ZESTRIL) 20 MG tablet Take 1 tablet (20 mg total) by mouth 2 (two)  times daily. 180 tablet 3   metFORMIN (GLUCOPHAGE) 500 MG tablet TAKE TWO TABLETS BY MOUTH TWICE A DAY WITH A MEAL 360 tablet 3   Multiple Vitamin (MULTIVITAMIN WITH MINERALS) TABS Take 1 tablet by mouth daily.     nebivolol (BYSTOLIC) 5 MG tablet Take 1 tablet (5 mg total) by mouth daily. 90 tablet 3   Semaglutide, 1 MG/DOSE, 4 MG/3ML SOPN Inject 1 mg as directed once a week. 9 mL 3   simvastatin (ZOCOR) 80 MG tablet TAKE 1/2 TABLET BY MOUTH EVERY NIGHT AT BEDTIME 45 tablet 2   vitamin B-12 (CYANOCOBALAMIN) 1000 MCG tablet Take 1,000 mcg by mouth daily.     vitamin E 180 MG (400 UNITS) capsule Take 400 Units by mouth daily.     No current facility-administered medications on file prior to visit.   Allergies  Allergen Reactions   Clindamycin/Lincomycin      CLINDAMYCIN TAKEN WITH CIPRO CAUSED SEVERE NAUSEA--PT STATES SHE CAN TAKE CIPRO ALONE WITHOUT PROBLEM--IT WAS JUST THE COMBINATION SHE DID NOT TOLERATE.   Levaquin [Levofloxacin] Nausea And Vomiting   Metronidazole Hcl Other (See Comments)    Deathly sick WHEN PT TOOK METRONIDAZOLE WITH CIPRO.  PT STATES SHE HAS TAKEN CIPRO ALONE WITHOUT PROBLEM --JUST DID NOT TOLERATE THE COMBINATION OF BOTH DRUGS TAKEN TOGETHER.   Social History   Socioeconomic History   Marital status: Married    Spouse name: Sonia Side   Number of children: 2   Years of education: Not on file   Highest education level: Not on file  Occupational History   Not on file  Tobacco Use   Smoking status: Never   Smokeless tobacco: Never  Substance and Sexual Activity   Alcohol use: No   Drug use: No   Sexual activity: Not Currently  Other Topics Concern   Not on file  Social History Narrative   2 sons   Lynnette Caffey and great grandchildren   Social Determinants of Health   Financial Resource Strain: Low Risk  (01/12/2022)   Overall Financial Resource Strain (CARDIA)    Difficulty of Paying Living Expenses: Not hard at all  Food Insecurity: No Food Insecurity (01/12/2022)   Hunger Vital Sign    Worried About Running Out of Food in the Last Year: Never true    Ran Out of Food in the Last Year: Never true  Transportation Needs: No Transportation Needs (01/12/2022)   PRAPARE - Hydrologist (Medical): No    Lack of Transportation (Non-Medical): No  Physical Activity: Sufficiently Active (01/12/2022)   Exercise Vital Sign    Days of Exercise per Week: 7 days    Minutes of Exercise per Session: 30 min  Stress: No Stress Concern Present (01/12/2022)   [place]    Feeling of Stress : Not at all  Social Connections: Moderately Integrated (01/12/2022)   Social Connection and Isolation Panel [NHANES]    Frequency of Communication  with Friends and Family: More than three times a week    Frequency of Social Gatherings with Friends and Family: More than three times a week    Attends Religious Services: 1 to 4 times per year    Active Member of [employer] or Organizations: No    Attends Archivist Meetings: Never    Marital Status: Married  Human resources officer Violence: Not At Risk (01/12/2022)   Humiliation, Afraid, Rape, and Kick questionnaire    Fear of Current  or Ex-Partner: No    Emotionally Abused: No    Physically Abused: No    Sexually Abused: No   No family history on file.     Review of Systems  All other systems reviewed and are negative.      Objective:   Physical Exam Vitals reviewed.  Constitutional:      General: She is not in acute distress.    Appearance: She is well-developed. She is not diaphoretic.  HENT:     Right Ear: External ear normal.     Left Ear: External ear normal.     Nose: Nose normal.  Neck:     Thyroid: No thyromegaly.     Vascular: No JVD.  Cardiovascular:     Rate and Rhythm: Normal rate and regular rhythm.     Heart sounds: Normal heart sounds. No murmur heard. Pulmonary:     Effort: Pulmonary effort is normal. No respiratory distress.     Breath sounds: Normal breath sounds. No wheezing or rales.  Abdominal:     General: Bowel sounds are normal. There is no distension.     Palpations: Abdomen is soft. There is no mass.     Tenderness: There is no abdominal tenderness. There is no guarding or rebound.  Musculoskeletal:     Cervical back: Neck supple.     Right lower leg: No edema.     Left lower leg: No edema.  Skin:    Findings: No erythema or rash.  Neurological:     Mental Status: She is alert and oriented to person, place, and time.     Cranial Nerves: No cranial nerve deficit.     Motor: No abnormal muscle tone.     Coordination: Coordination normal.           Assessment & Plan:  Controlled type 2 diabetes mellitus with diabetic  nephropathy, without long-term current use of insulin ([place]) - Plan: CBC with Differential/Platelet, Lipid panel, COMPLETE METABOLIC PANEL WITH GFR, Hemoglobin A1c, TSH  Pure hypercholesterolemia - Plan: CBC with Differential/Platelet, Lipid panel, COMPLETE METABOLIC PANEL WITH GFR, Hemoglobin A1c, TSH  Other specified hypothyroidism - Plan: CBC with Differential/Platelet, Lipid panel, COMPLETE METABOLIC PANEL WITH GFR, Hemoglobin A1c, TSH From a medical standpoint, the patient looks great.  She is losing weight.  Her blood pressure is excellent.  Her blood sugar is excellent.  I will check a CBC a CMP and A1c a lipid panel and a TSH.  If she continues to experience fatigue we may hold the nebivolol.  However I believe is multifactorial and related to the weight loss, the COVID, etc.  Goal A1c is less than 6.5.  Goal LDL cholesterol is less than 100

## 2022-12-01 LAB — COMPLETE METABOLIC PANEL WITH GFR
AG Ratio: 1.8 (calc) (ref 1.0–2.5)
ALT: 9 U/L (ref 6–29)
AST: 12 U/L (ref 10–35)
Albumin: 4.6 g/dL (ref 3.6–5.1)
Alkaline phosphatase (APISO): 57 U/L (ref 37–153)
BUN/Creatinine Ratio: 13 (calc) (ref 6–22)
BUN: 15 mg/dL (ref 7–25)
CO2: 25 mmol/L (ref 20–32)
Calcium: 9.7 mg/dL (ref 8.6–10.4)
Chloride: 102 mmol/L (ref 98–110)
Creat: 1.15 mg/dL — ABNORMAL HIGH (ref 0.60–1.00)
Globulin: 2.6 g/dL (calc) (ref 1.9–3.7)
Glucose, Bld: 82 mg/dL (ref 65–99)
Potassium: 4.7 mmol/L (ref 3.5–5.3)
Sodium: 141 mmol/L (ref 135–146)
Total Bilirubin: 0.3 mg/dL (ref 0.2–1.2)
Total Protein: 7.2 g/dL (ref 6.1–8.1)
eGFR: 48 mL/min/{1.73_m2} — ABNORMAL LOW (ref >=60)

## 2022-12-01 LAB — CBC WITH DIFFERENTIAL/PLATELET
Absolute Monocytes: 882 cells/uL (ref 200–950)
Basophils Absolute: 78 cells/uL (ref 0–200)
Basophils Relative: 0.8 %
Eosinophils Absolute: 157 cells/uL (ref 15–500)
Eosinophils Relative: 1.6 %
HCT: 37.6 % (ref 35.0–45.0)
Hemoglobin: 12.8 g/dL (ref 11.7–15.5)
Lymphs Abs: 2234 cells/uL (ref 850–3900)
MCH: 31.2 pg (ref 27.0–33.0)
MCHC: 34 g/dL (ref 32.0–36.0)
MCV: 91.7 fL (ref 80.0–100.0)
MPV: 10.1 fL (ref 7.5–12.5)
Monocytes Relative: 9 %
Neutro Abs: 6448 cells/uL (ref 1500–7800)
Neutrophils Relative %: 65.8 %
Platelets: 330 10*3/uL (ref 140–400)
RBC: 4.1 10*6/uL (ref 3.80–5.10)
RDW: 13 % (ref 11.0–15.0)
Total Lymphocyte: 22.8 %
WBC: 9.8 10*3/uL (ref 3.8–10.8)

## 2022-12-01 LAB — TSH: TSH: 1.21 mIU/L (ref 0.40–4.50)

## 2022-12-01 LAB — HEMOGLOBIN A1C
Hgb A1c MFr Bld: 5.9 % of total Hgb — ABNORMAL HIGH (ref <5.7)
Mean Plasma Glucose: 123 mg/dL
eAG (mmol/L): 6.8 mmol/L

## 2022-12-01 LAB — LIPID PANEL
Cholesterol: 212 mg/dL — ABNORMAL HIGH (ref <200)
HDL: 42 mg/dL — ABNORMAL LOW (ref >=50)
LDL Cholesterol (Calc): 142 mg/dL (calc) — ABNORMAL HIGH
Non-HDL Cholesterol (Calc): 170 mg/dL (calc) — ABNORMAL HIGH (ref <130)
Total CHOL/HDL Ratio: 5 (calc) — ABNORMAL HIGH (ref <5.0)
Triglycerides: 146 mg/dL (ref <150)

## 2022-12-05 ENCOUNTER — Other Ambulatory Visit: Payer: Self-pay

## 2022-12-05 MED ORDER — ROSUVASTATIN CALCIUM 20 MG PO TABS: 20.0000 mg | ORAL_TABLET | Freq: Every day | ORAL | 3 refills | Status: DC

## 2022-12-06 ENCOUNTER — Other Ambulatory Visit: Payer: Self-pay | Admitting: Family Medicine

## 2022-12-06 NOTE — Telephone Encounter (Signed)
Requested Prescriptions  Pending Prescriptions Disp Refills   furosemide (LASIX) 20 MG tablet [Pharmacy Med Name: FUROSEMIDE 20 MG TABLET] 30 tablet 3    Sig: TAKE 1 TABLET BY MOUTH EVERY DAY     Cardiovascular:  Diuretics - Loop Failed - 12/06/2022  8:42 AM      Failed - Cr in normal range and within 180 days    Creat  Date Value Ref Range Status  11/30/2022 1.15 (H) 0.60 - 1.00 mg/dL Final   Creatinine, Urine  Date Value Ref Range Status  09/19/2022 225 20 - 275 mg/dL Final         Failed - Mg Level in normal range and within 180 days    No results found for: "MG"       Failed - Valid encounter within last 6 months    Recent Outpatient Visits           9 months ago Essential hypertension   [place] [facility], Cammie Mcgee, MD   10 months ago Essential hypertension   [place] Pickard, Cammie Mcgee, MD   1 year ago Essential hypertension   [place] Pickard, Cammie Mcgee, MD   1 year ago Essential hypertension   [place] Schaumann, Cammie Mcgee, MD   1 year ago Essential hypertension   [place], Cammie Mcgee, MD              Passed - K in normal range and within 180 days    Potassium  Date Value Ref Range Status  11/30/2022 4.7 3.5 - 5.3 mmol/L Final         Passed - Ca in normal range and within 180 days    Calcium  Date Value Ref Range Status  11/30/2022 9.7 8.6 - 10.4 mg/dL Final         Passed - Na in normal range and within 180 days    Sodium  Date Value Ref Range Status  11/30/2022 141 135 - 146 mmol/L Final         Passed - Cl in normal range and within 180 days    Chloride  Date Value Ref Range Status  11/30/2022 102 98 - 110 mmol/L Final         Passed - Last BP in normal range    BP Readings from Last 1 Encounters:  11/30/22 132/64

## 2022-12-14 ENCOUNTER — Other Ambulatory Visit: Payer: Self-pay | Admitting: Family Medicine

## 2022-12-14 NOTE — Telephone Encounter (Signed)
Refilled 11/21/2022 #120 0 rf - Requested Prescriptions  Pending Prescriptions Disp Refills   allopurinol (ZYLOPRIM) 100 MG tablet [Pharmacy Med Name: ALLOPURINOL 100 MG TABLET] 120 tablet 0    Sig: TAKE 2 TABLETS BY MOUTH EVERY DAY     Endocrinology:  Gout Agents - allopurinol Failed - 12/14/2022 10:31 AM      Failed - Uric Acid in normal range and within 360 days    Uric Acid, Serum  Date Value Ref Range Status  05/01/2018 6.3 2.5 - 7.0 mg/dL Final    Comment:    Therapeutic target for gout patients: <6.0 mg/dL .          Failed - Cr in normal range and within 360 days    Creat  Date Value Ref Range Status  11/30/2022 1.15 (H) 0.60 - 1.00 mg/dL Final   Creatinine, Urine  Date Value Ref Range Status  09/19/2022 225 20 - 275 mg/dL Final         Passed - Valid encounter within last 12 months    Recent Outpatient Visits           9 months ago Essential hypertension   [place] Pickard, Cammie Mcgee, MD   11 months ago Essential hypertension   [place] Pickard, Cammie Mcgee, MD   1 year ago Essential hypertension   [place] Pickard, Cammie Mcgee, MD   1 year ago Essential hypertension   [place] Pickard, Cammie Mcgee, MD   1 year ago Essential hypertension   [place] Pickard, Cammie Mcgee, MD              Passed - CBC within normal limits and completed in the last 12 months    WBC  Date Value Ref Range Status  11/30/2022 9.8 3.8 - 10.8 Thousand/uL Final   RBC  Date Value Ref Range Status  11/30/2022 4.10 3.80 - 5.10 Million/uL Final   Hemoglobin  Date Value Ref Range Status  11/30/2022 12.8 11.7 - 15.5 g/dL Final   HCT  Date Value Ref Range Status  11/30/2022 37.6 35.0 - 45.0 % Final   MCHC  Date Value Ref Range Status  11/30/2022 34.0 32.0 - 36.0 g/dL Final   [facility]  Date Value Ref Range Status  11/30/2022 31.2 27.0 - 33.0 pg Final   MCV  Date Value Ref Range Status   11/30/2022 91.7 80.0 - 100.0 fL Final   No results found for: "PLTCOUNTKUC", "LABPLAT", "POCPLA" RDW  Date Value Ref Range Status  11/30/2022 13.0 11.0 - 15.0 % Final

## 2022-12-27 LAB — HM MAMMOGRAPHY

## 2023-01-01 ENCOUNTER — Encounter: Payer: Self-pay | Admitting: Family Medicine

## 2023-01-01 LAB — HM MAMMOGRAPHY

## 2023-01-08 ENCOUNTER — Ambulatory Visit: Payer: Medicare Other | Admitting: Family Medicine

## 2023-01-08 ENCOUNTER — Encounter: Payer: Self-pay | Admitting: Family Medicine

## 2023-01-08 VITALS — BP 124/62 | HR 85 | Temp 98.6°F | Ht 63.0 in | Wt 185.0 lb

## 2023-01-08 DIAGNOSIS — B351 Tinea unguium: Secondary | ICD-10-CM

## 2023-01-08 MED ORDER — TERBINAFINE HCL 250 MG PO TABS: 250.0000 mg | ORAL_TABLET | Freq: Every day | ORAL | 2 refills | Status: DC

## 2023-01-08 NOTE — Progress Notes (Signed)
Subjective:    Patient ID: Whitney Morales, female    DOB: [birth year], 82 y.o.   MRN: [id number] The right great toenail and the right second toenail are dark and opaque.  They also have onycholysis as the layers are separating.  The nails themselves are thickened and discolored.  Physically they look like onychomycosis however the patient feels that she suffered a subungual hematoma that triggered this over a month ago and her right great toenail.  She states that the nail never looked like this before the injury.  It is certainly dysmorphic in appearance today but that would not explain the discoloration and thickening to the right second toe Past Medical History:  Diagnosis Date   Abnormal finding on EKG 10/29/2012   FOLLOW UP NUCLEAR STRESS TEST ON 11/05/12 -NORMAL   Corneal erosion 2012   RIGHT EYE--HAS RESOLVED--NO PROBLEMS SINCE   Diabetes mellitus    Diverticulitis    Fatigue    GERD (gastroesophageal reflux disease)    PAST HX--NO PROBLEMS NOW-PT DOES TAKE DAILY TUMS AT BEDTIME   Goiter    cyst vs goiter on thyroid   H/O hiatal hernia    High cholesterol    Hypertension    Obesity    Osteopenia    Past Surgical History:  Procedure Laterality Date   ABDOMINAL HYSTERECTOMY     BREAST LUMPECTOMY     BENIGN   CARDIOVASCULAR STRESS TEST  06/08/2009   EF 78%, NO ISCHEMIA   THYROID LOBECTOMY  11/27/2012   Procedure: THYROID LOBECTOMY;  Surgeon: Ralene Ok, MD;  Location: [facility];  Service: General;  Laterality: Right;  Right Thyroid Lobectomy   TONSILLECTOMY     US ECHOCARDIOGRAPHY  03/23/2006   EF 55-60%   Current Outpatient Medications on File Prior to Visit  Medication Sig Dispense Refill   Accu-Chek Softclix Lancets lancets USE AS DIRECTED TO CHECK SUGAR 100 each 3   allopurinol (ZYLOPRIM) 100 MG tablet TAKE 2 TABLETS BY MOUTH EVERY DAY 120 tablet 0   amLODipine (NORVASC) 10 MG tablet Take 1 tablet (10 mg total) by mouth daily. 90 tablet 3   Blood Glucose Monitoring  Suppl (ACCU-CHEK AVIVA PLUS) W/DEVICE KIT PT NEEDS METER-STRIPS(1 BOTTLE =100/5 REFILLS)-LANCETS(1 BOX/5 REFILLS) CHECKS BS QD DX: 250.00 1 kit 0   Calcium Carbonate (CALCIUM 500 PO) Take by mouth.     Cholecalciferol (VITAMIN D-3) 125 MCG (5000 UT) TABS Take by mouth.     cloNIDine (CATAPRES) 0.1 MG tablet Take 2 tablets (0.2 mg total) by mouth 3 (three) times daily as needed. 15 tablet 0   furosemide (LASIX) 20 MG tablet TAKE 1 TABLET BY MOUTH EVERY DAY 90 tablet 0   glucose blood (ACCU-CHEK AVIVA PLUS) test strip CHECK BLOOD SUGAR TWICE DAILY FASTING IN MORNING, 2 HOURS AFTER A MEAL E11.65O 100 strip 3   levothyroxine (SYNTHROID) 88 MCG tablet Take 1 tablet (88 mcg total) by mouth daily. 90 tablet 3   lisinopril (ZESTRIL) 20 MG tablet Take 1 tablet (20 mg total) by mouth 2 (two) times daily. 180 tablet 3   metFORMIN (GLUCOPHAGE) 500 MG tablet TAKE TWO TABLETS BY MOUTH TWICE A DAY WITH A MEAL 360 tablet 3   Multiple Vitamin (MULTIVITAMIN WITH MINERALS) TABS Take 1 tablet by mouth daily.     nebivolol (BYSTOLIC) 5 MG tablet Take 1 tablet (5 mg total) by mouth daily. 90 tablet 3   rosuvastatin (CRESTOR) 20 MG tablet Take 1 tablet (20 mg total) by mouth daily.  90 tablet 3   Semaglutide, 1 MG/DOSE, 4 MG/3ML SOPN Inject 1 mg as directed once a week. 9 mL 3   vitamin B-12 (CYANOCOBALAMIN) 1000 MCG tablet Take 1,000 mcg by mouth daily.     vitamin E 180 MG (400 UNITS) capsule Take 400 Units by mouth daily.     No current facility-administered medications on file prior to visit.   Allergies  Allergen Reactions   Clindamycin/Lincomycin     CLINDAMYCIN TAKEN WITH CIPRO CAUSED SEVERE NAUSEA--PT STATES SHE CAN TAKE CIPRO ALONE WITHOUT PROBLEM--IT WAS JUST THE COMBINATION SHE DID NOT TOLERATE.   Levaquin [Levofloxacin] Nausea And Vomiting   Metronidazole Hcl Other (See Comments)    Deathly sick WHEN PT TOOK METRONIDAZOLE WITH CIPRO.  PT STATES SHE HAS TAKEN CIPRO ALONE WITHOUT PROBLEM --JUST DID NOT  TOLERATE THE COMBINATION OF BOTH DRUGS TAKEN TOGETHER.   Social History   Socioeconomic History   Marital status: Married    Spouse name: Sonia Side   Number of children: 2   Years of education: Not on file   Highest education level: Not on file  Occupational History   Not on file  Tobacco Use   Smoking status: Never   Smokeless tobacco: Never  Substance and Sexual Activity   Alcohol use: No   Drug use: No   Sexual activity: Not Currently  Other Topics Concern   Not on file  Social History Narrative   2 sons   Lynnette Caffey and great grandchildren   Social Determinants of Health   Financial Resource Strain: Low Risk  (01/12/2022)   Overall Financial Resource Strain (CARDIA)    Difficulty of Paying Living Expenses: Not hard at all  Food Insecurity: No Food Insecurity (01/12/2022)   Hunger Vital Sign    Worried About Running Out of Food in the Last Year: Never true    Ran Out of Food in the Last Year: Never true  Transportation Needs: No Transportation Needs (01/12/2022)   PRAPARE - Hydrologist (Medical): No    Lack of Transportation (Non-Medical): No  Physical Activity: Sufficiently Active (01/12/2022)   Exercise Vital Sign    Days of Exercise per Week: 7 days    Minutes of Exercise per Session: 30 min  Stress: No Stress Concern Present (01/12/2022)   [place]    Feeling of Stress : Not at all  Social Connections: Moderately Integrated (01/12/2022)   Social Connection and Isolation Panel [NHANES]    Frequency of Communication with Friends and Family: More than three times a week    Frequency of Social Gatherings with Friends and Family: More than three times a week    Attends Religious Services: 1 to 4 times per year    Active Member of [employer] or Organizations: No    Attends Archivist Meetings: Never    Marital Status: Married  Human resources officer Violence: Not At Risk  (01/12/2022)   Humiliation, Afraid, Rape, and Kick questionnaire    Fear of Current or Ex-Partner: No    Emotionally Abused: No    Physically Abused: No    Sexually Abused: No   No family history on file.     Review of Systems  All other systems reviewed and are negative.      Objective:   Physical Exam Vitals reviewed.  Constitutional:      General: She is not in acute distress.    Appearance: She is well-developed. She  is not diaphoretic.  HENT:     Right Ear: External ear normal.     Left Ear: External ear normal.     Nose: Nose normal.  Neck:     Thyroid: No thyromegaly.     Vascular: No JVD.  Cardiovascular:     Rate and Rhythm: Normal rate and regular rhythm.     Heart sounds: Normal heart sounds. No murmur heard. Pulmonary:     Effort: Pulmonary effort is normal. No respiratory distress.     Breath sounds: Normal breath sounds. No wheezing or rales.  Abdominal:     General: Bowel sounds are normal. There is no distension.     Palpations: Abdomen is soft. There is no mass.     Tenderness: There is no abdominal tenderness. There is no guarding or rebound.  Musculoskeletal:     Cervical back: Neck supple.     Right lower leg: No edema.     Left lower leg: No edema.  Skin:    Findings: No erythema or rash.  Neurological:     Mental Status: She is alert and oriented to person, place, and time.     Cranial Nerves: No cranial nerve deficit.     Motor: No abnormal muscle tone.     Coordination: Coordination normal.   Please see the description in the history of present illness        Assessment & Plan:  Onychomycosis I believe that the patient has onychomycosis.  We will try Lamisil 250 mg daily for 90 days.  She will come in for a month to check liver function test while taking medication.  As long as the nail is slowly progressively improving we will continue treatment for the total 90 days.  If not we will revisit the diagnosis

## 2023-01-18 ENCOUNTER — Ambulatory Visit (INDEPENDENT_AMBULATORY_CARE_PROVIDER_SITE_OTHER): Payer: Medicare Other

## 2023-01-18 VITALS — BP 122/68 | Ht 63.0 in | Wt 187.0 lb

## 2023-01-18 DIAGNOSIS — Z0000 Encounter for general adult medical examination without abnormal findings: Secondary | ICD-10-CM

## 2023-01-18 NOTE — Progress Notes (Signed)
Subjective:   Whitney Morales is a 82 y.o. female who presents for Medicare Annual (Subsequent) preventive examination.  Review of Systems     Cardiac Risk Factors include: advanced age (>24men, >48 women);diabetes mellitus;dyslipidemia;hypertension;sedentary lifestyle     Objective:    Today's Vitals   01/18/23 1019  BP: 122/68  Weight: 187 lb (84.8 kg)  Height: 5\' 3"  (1.6 m)   Body mass index is 33.13 kg/m.     01/18/2023   10:45 AM 01/12/2022   10:44 AM 09/29/2021    9:08 PM 01/23/2021   10:09 PM 01/18/2021    2:12 PM 11/27/2012   11:05 AM 10/29/2012    1:34 PM  Advanced Directives  Does Patient Have a Medical Advance Directive? Yes Yes Yes No No Patient has advance directive, copy not in chart Patient does not have advance directive;Patient would like information  Type of Advance Directive [place];Living will [place];Living will [place]      Does patient want to make changes to medical advance directive? No - Patient declined        Copy of [place] in Chart? No - copy requested No - copy requested    Copy requested from family   Would patient like information on creating a medical advance directive?     No - Patient declined  Advance directive packet given  Pre-existing out of facility DNR order (yellow form or pink MOST form)      No No    Current Medications (verified) Outpatient Encounter Medications as of 01/18/2023  Medication Sig   Accu-Chek Softclix Lancets lancets USE AS DIRECTED TO CHECK SUGAR   allopurinol (ZYLOPRIM) 100 MG tablet TAKE 2 TABLETS BY MOUTH EVERY DAY   amLODipine (NORVASC) 10 MG tablet Take 1 tablet (10 mg total) by mouth daily.   Blood Glucose Monitoring Suppl (ACCU-CHEK AVIVA PLUS) W/DEVICE KIT PT NEEDS METER-STRIPS(1 BOTTLE =100/5 REFILLS)-LANCETS(1 BOX/5 REFILLS) CHECKS BS QD DX: 250.00   Calcium Carbonate (CALCIUM 500 PO) Take by mouth.   Cholecalciferol (VITAMIN  D-3) 125 MCG (5000 UT) TABS Take by mouth.   cloNIDine (CATAPRES) 0.1 MG tablet Take 2 tablets (0.2 mg total) by mouth 3 (three) times daily as needed.   furosemide (LASIX) 20 MG tablet TAKE 1 TABLET BY MOUTH EVERY DAY   glucose blood (ACCU-CHEK AVIVA PLUS) test strip CHECK BLOOD SUGAR TWICE DAILY FASTING IN MORNING, 2 HOURS AFTER A MEAL E11.65O   levothyroxine (SYNTHROID) 88 MCG tablet Take 1 tablet (88 mcg total) by mouth daily.   lisinopril (ZESTRIL) 20 MG tablet Take 1 tablet (20 mg total) by mouth 2 (two) times daily.   metFORMIN (GLUCOPHAGE) 500 MG tablet TAKE TWO TABLETS BY MOUTH TWICE A DAY WITH A MEAL   Multiple Vitamin (MULTIVITAMIN WITH MINERALS) TABS Take 1 tablet by mouth daily.   nebivolol (BYSTOLIC) 5 MG tablet Take 1 tablet (5 mg total) by mouth daily.   rosuvastatin (CRESTOR) 20 MG tablet Take 1 tablet (20 mg total) by mouth daily.   Semaglutide, 1 MG/DOSE, 4 MG/3ML SOPN Inject 1 mg as directed once a week.   terbinafine (LAMISIL) 250 MG tablet Take 1 tablet (250 mg total) by mouth daily.   vitamin B-12 (CYANOCOBALAMIN) 1000 MCG tablet Take 1,000 mcg by mouth daily.   vitamin E 180 MG (400 UNITS) capsule Take 400 Units by mouth daily.   No facility-administered encounter medications on file as of 01/18/2023.  Allergies (verified) Clindamycin/lincomycin, Levaquin [levofloxacin], and Metronidazole hcl   History: Past Medical History:  Diagnosis Date   Abnormal finding on EKG 10/29/2012   FOLLOW UP NUCLEAR STRESS TEST ON 11/05/12 -NORMAL   Corneal erosion 2012   RIGHT EYE--HAS RESOLVED--NO PROBLEMS SINCE   Diabetes mellitus    Diverticulitis    Fatigue    GERD (gastroesophageal reflux disease)    PAST HX--NO PROBLEMS NOW-PT DOES TAKE DAILY TUMS AT BEDTIME   Goiter    cyst vs goiter on thyroid   H/O hiatal hernia    High cholesterol    Hypertension    Obesity    Osteopenia    Past Surgical History:  Procedure Laterality Date   ABDOMINAL HYSTERECTOMY     BREAST  LUMPECTOMY     BENIGN   CARDIOVASCULAR STRESS TEST  06/08/2009   EF 78%, NO ISCHEMIA   THYROID LOBECTOMY  11/27/2012   Procedure: THYROID LOBECTOMY;  Surgeon: Ralene Ok, MD;  Location: [facility];  Service: General;  Laterality: Right;  Right Thyroid Lobectomy   TONSILLECTOMY     US ECHOCARDIOGRAPHY  03/23/2006   EF 55-60%   History reviewed. No pertinent family history. Social History   Socioeconomic History   Marital status: Married    Spouse name: Sonia Side   Number of children: 2   Years of education: Not on file   Highest education level: Not on file  Occupational History   Not on file  Tobacco Use   Smoking status: Never   Smokeless tobacco: Never  Substance and Sexual Activity   Alcohol use: No   Drug use: No   Sexual activity: Not Currently  Other Topics Concern   Not on file  Social History Narrative   2 sons   Lynnette Caffey and great grandchildren   Social Determinants of Health   Financial Resource Strain: Low Risk  (01/18/2023)   Overall Financial Resource Strain (CARDIA)    Difficulty of Paying Living Expenses: Not hard at all  Food Insecurity: No Food Insecurity (01/18/2023)   Hunger Vital Sign    Worried About Running Out of Food in the Last Year: Never true    Ran Out of Food in the Last Year: Never true  Transportation Needs: No Transportation Needs (01/18/2023)   PRAPARE - Hydrologist (Medical): No    Lack of Transportation (Non-Medical): No  Physical Activity: Sufficiently Active (01/18/2023)   Exercise Vital Sign    Days of Exercise per Week: 5 days    Minutes of Exercise per Session: 30 min  Stress: No Stress Concern Present (01/18/2023)   [place]    Feeling of Stress : Not at all  Social Connections: Moderately Integrated (01/18/2023)   Social Connection and Isolation Panel [NHANES]    Frequency of Communication with Friends and Family: More than three  times a week    Frequency of Social Gatherings with Friends and Family: More than three times a week    Attends Religious Services: 1 to 4 times per year    Active Member of [employer] or Organizations: No    Attends Music therapist: Never    Marital Status: Married    Tobacco Counseling Counseling given: Not Answered   Clinical Intake:  Pre-visit preparation completed: Yes  Pain : No/denies pain     Diabetes: Yes CBG done?: No Did pt. bring in CBG monitor from home?: No  How often do you need  to have someone help you when you read instructions, pamphlets, or other written materials from your doctor or pharmacy?: 1 - Never  Diabetic?Yes   Nutrition Risk Assessment:  Has the patient had any N/V/D within the last 2 months?  No  Does the patient have any non-healing wounds?  No  Has the patient had any unintentional weight loss or weight gain?  No   Diabetes:  Is the patient diabetic?  Yes  If diabetic, was a CBG obtained today?  No  Did the patient bring in their glucometer from home?  No  How often do you monitor your CBG's? Daily .   Financial Strains and Diabetes Management:  Are you having any financial strains with the device, your supplies or your medication? No .  Does the patient want to be seen by Chronic Care Management for management of their diabetes?  No  Would the patient like to be referred to a Nutritionist or for Diabetic Management?  No   Diabetic Exams:  Diabetic Eye Exam: Completed 08/07/22 Diabetic Foot Exam: Completed 11/30/22   Interpreter Needed?: No  Information entered by :: Denman George LPN   Activities of Daily Living    01/18/2023   10:45 AM  In your present state of health, do you have any difficulty performing the following activities:  Hearing? 0  Vision? 0  Difficulty concentrating or making decisions? 0  Walking or climbing stairs? 0  Dressing or bathing? 0  Doing errands, shopping? 0  Preparing Food and  eating ? N  Using the Toilet? N  In the past six months, have you accidently leaked urine? N  Do you have problems with loss of bowel control? N  Managing your Medications? N  Managing your Finances? N  Housekeeping or managing your Housekeeping? N    Patient Care Team: Susy Frizzle, MD as PCP - General (Family Medicine)  Indicate any recent Medical Services you may have received from other than [facility] providers in the past year (date may be approximate).     Assessment:   This is a routine wellness examination for [place].  Hearing/Vision screen Hearing Screening - Comments:: Denies hearing difficulties  Vision Screening - Comments:: up to date with routine eye exams with [facility]    Dietary issues and exercise activities discussed: Current Exercise Habits: Home exercise routine, Type of exercise: walking, Time (Minutes): 30, Frequency (Times/Week): 5, Weekly Exercise (Minutes/Week): 150, Intensity: Mild   Goals Addressed   None    Depression Screen    01/18/2023   10:44 AM 01/08/2023   11:01 AM 11/30/2022    4:23 PM 09/05/2022   11:12 AM 01/12/2022   10:39 AM 06/24/2021   10:18 AM 01/15/2020    8:06 AM  PHQ 2/9 Scores  PHQ - 2 Score 0 0 0 0 0 0 0    Fall Risk    01/18/2023   10:43 AM 01/08/2023   11:01 AM 11/30/2022    4:23 PM 09/05/2022   11:12 AM 01/12/2022   10:45 AM  [place] in the past year? 0 0 0 0 1  Number falls in past yr: 0 0 0 0 0  Injury with Fall? 0 0 0 0 1  Risk for fall due to : No Fall Risks No Fall Risks No Fall Risks No Fall Risks No Fall Risks;Impaired balance/gait  Follow up Falls prevention discussed;Education provided;Falls evaluation completed Falls prevention discussed Falls prevention discussed Falls prevention discussed Falls prevention discussed  FALL RISK PREVENTION PERTAINING TO THE HOME:  Any stairs in or around the home? Yes  If so, are there any without handrails? No  Home free of loose throw rugs in  walkways, pet beds, electrical cords, etc? Yes  Adequate lighting in your home to reduce risk of falls? Yes   ASSISTIVE DEVICES UTILIZED TO PREVENT FALLS:  Life alert? No  Use of a cane, walker or w/c? No  Grab bars in the bathroom? Yes  Shower chair or bench in shower? No  Elevated toilet seat or a handicapped toilet? Yes   TIMED UP AND GO:  Was the test performed? Yes .  Length of time to ambulate 10 feet: 5 sec.   Gait steady and fast without use of assistive device  Cognitive Function:        01/18/2023   10:46 AM 01/12/2022   10:52 AM  6CIT Screen  What Year? 0 points 0 points  What month? 0 points 0 points  What time? 0 points 0 points  Count back from 20 0 points 0 points  Months in reverse 0 points 0 points  Repeat phrase 0 points 0 points  Total Score 0 points 0 points    Immunizations Immunization History  Administered Date(s) Administered   Fluad Quad(high Dose 65+) 06/23/2020   Influenza Split 08/11/2013, 07/24/2015, 08/11/2016   Influenza, High Dose Seasonal PF 07/31/2017, 07/16/2019   Influenza-Unspecified 07/23/2021, 07/14/2022   PFIZER Comirnaty(Gray Top)Covid-19 Tri-Sucrose Vaccine 09/04/2022   PFIZER(Purple Top)SARS-COV-2 Vaccination 11/20/2019, 12/12/2019   Pneumococcal Conjugate-13 09/15/2013   Pneumococcal Polysaccharide-23 09/04/2005, 08/08/2016   Zoster, Live 04/24/2011    TDAP status: Due, Education has been provided regarding the importance of this vaccine. Advised may receive this vaccine at local pharmacy or Health Dept. Aware to provide a copy of the vaccination record if obtained from local pharmacy or Health Dept. Verbalized acceptance and understanding.  Flu Vaccine status: Up to date  Pneumococcal vaccine status: Up to date  Covid-19 vaccine status: Information provided on how to obtain vaccines.   Qualifies for Shingles Vaccine? Yes   Zostavax completed Yes   Shingrix Completed?: No.    Education has been provided regarding the  importance of this vaccine. Patient has been advised to call insurance company to determine out of pocket expense if they have not yet received this vaccine. Advised may also receive vaccine at local pharmacy or Health Dept. Verbalized acceptance and understanding.  Screening Tests Health Maintenance  Topic Date Due   Medicare Annual Wellness (AWV)  01/13/2023   COVID-19 Vaccine (4 - 2023-24 season) 01/22/2023 (Originally 10/30/2022)   Zoster Vaccines- Shingrix (1 of 2) 02/28/2023 (Originally 05/20/1993)   HEMOGLOBIN A1C  05/31/2023   OPHTHALMOLOGY EXAM  08/08/2023   Diabetic kidney evaluation - Urine ACR  09/20/2023   Diabetic kidney evaluation - eGFR measurement  12/01/2023   FOOT EXAM  12/01/2023   MAMMOGRAM  12/27/2023   Pneumonia Vaccine 68+ Years old  Completed   INFLUENZA VACCINE  Completed   DEXA SCAN  Completed   Hepatitis C Screening  Completed   HPV VACCINES  Aged Out   DTaP/Tdap/Td  Discontinued   COLONOSCOPY (Pts 45-14yrs Insurance coverage will need to be confirmed)  Discontinued    Health Maintenance  Health Maintenance Due  Topic Date Due   Medicare Annual Wellness (AWV)  01/13/2023    Colorectal cancer screening: No longer required.   Mammogram status: Completed 12/27/22. Repeat every year  Bone Density status: Completed 07/10/22. Results reflect:  Bone density results: OSTEOPENIA. Repeat every 2 years.  Lung Cancer Screening: (Low Dose CT Chest recommended if Age 13-80 years, 30 pack-year currently smoking OR have quit w/in 15years.) does not qualify.   Lung Cancer Screening Referral: n/a  Additional Screening:  Hepatitis C Screening: does qualify; Completed 12/14/20  Vision Screening: Recommended annual ophthalmology exams for early detection of glaucoma and other disorders of the eye. Is the patient up to date with their annual eye exam?  Yes  Who is the provider or what is the name of the office in which the patient attends annual eye exams? [facility]  Opthalmology If pt is not established with a provider, would they like to be referred to a provider to establish care? No .   Dental Screening: Recommended annual dental exams for proper oral hygiene  Community Resource Referral / Chronic Care Management: CRR required this visit?  No   CCM required this visit?  No      Plan:     I have personally reviewed and noted the following in the patient's chart:   Medical and social history Use of alcohol, tobacco or illicit drugs  Current medications and supplements including opioid prescriptions. Patient is not currently taking opioid prescriptions. Functional ability and status Nutritional status Physical activity Advanced directives List of other physicians Hospitalizations, surgeries, and ER visits in previous 12 months Vitals Screenings to include cognitive, depression, and falls Referrals and appointments  In addition, I have reviewed and discussed with patient certain preventive protocols, quality metrics, and best practice recommendations. A written personalized care plan for preventive services as well as general preventive health recommendations were provided to patient.     Denman George [place], [place]   [id number]   Nurse Notes: No concerns

## 2023-01-18 NOTE — Patient Instructions (Addendum)
Whitney Morales , Thank you for taking time to come for your Medicare Wellness Visit. I appreciate your ongoing commitment to your health goals. Please review the following plan we discussed and let me know if I can assist you in the future.   These are the goals we discussed:  Goals      Weight (lb) < 200 lb (90.7 kg)     Pt would like to lose weight. Travel more.        This is a list of the screening recommended for you and due dates:  Health Maintenance  Topic Date Due   Medicare Annual Wellness Visit  01/13/2023   COVID-19 Vaccine (4 - 2023-24 season) 01/22/2023*   Zoster (Shingles) Vaccine (1 of 2) 02/28/2023*   Hemoglobin A1C  05/31/2023   Eye exam for diabetics  08/08/2023   Yearly kidney health urinalysis for diabetes  09/20/2023   Yearly kidney function blood test for diabetes  12/01/2023   Complete foot exam   12/01/2023   Mammogram  12/27/2023   Pneumonia Vaccine  Completed   Flu Shot  Completed   DEXA scan (bone density measurement)  Completed   Hepatitis C Screening: USPSTF Recommendation to screen - Ages 29-79 yo.  Completed   HPV Vaccine  Aged Out   DTaP/Tdap/Td vaccine  Discontinued   Colon Cancer Screening  Discontinued  *Topic was postponed. The date shown is not the original due date.    Advanced directives: Please bring a copy of your health care power of attorney and living will to the office to be added to your chart at your convenience.   Conditions/risks identified: Aim for 30 minutes of exercise or brisk walking, 6-8 glasses of water, and 5 servings of fruits and vegetables each day.   Next appointment: Follow up in one year for your annual wellness visit    Preventive Care 65 Years and Older, Female Preventive care refers to lifestyle choices and visits with your health care provider that can promote health and wellness. What does preventive care include? A yearly physical exam. This is also called an annual well check. Dental exams once or twice a  year. Routine eye exams. Ask your health care provider how often you should have your eyes checked. Personal lifestyle choices, including: Daily care of your teeth and gums. Regular physical activity. Eating a healthy diet. Avoiding tobacco and drug use. Limiting alcohol use. Practicing safe sex. Taking low-dose aspirin every day. Taking vitamin and mineral supplements as recommended by your health care provider. What happens during an annual well check? The services and screenings done by your health care provider during your annual well check will depend on your age, overall health, lifestyle risk factors, and family history of disease. Counseling  Your health care provider may ask you questions about your: Alcohol use. Tobacco use. Drug use. Emotional well-being. Home and relationship well-being. Sexual activity. Eating habits. History of falls. Memory and ability to understand (cognition). Work and work Statistician. Reproductive health. Screening  You may have the following tests or measurements: Height, weight, and BMI. Blood pressure. Lipid and cholesterol levels. These may be checked every 5 years, or more frequently if you are over 65 years old. Skin check. Lung cancer screening. You may have this screening every year starting at age 66 if you have a 30-pack-year history of smoking and currently smoke or have quit within the past 15 years. Fecal occult blood test (FOBT) of the stool. You may have this test every  year starting at age 34. Flexible sigmoidoscopy or colonoscopy. You may have a sigmoidoscopy every 5 years or a colonoscopy every 10 years starting at age 2. Hepatitis C blood test. Hepatitis B blood test. Sexually transmitted disease (STD) testing. Diabetes screening. This is done by checking your blood sugar (glucose) after you have not eaten for a while (fasting). You may have this done every 1-3 years. Bone density scan. This is done to screen for  osteoporosis. You may have this done starting at age 82. Mammogram. This may be done every 1-2 years. Talk to your health care provider about how often you should have regular mammograms. Talk with your health care provider about your test results, treatment options, and if necessary, the need for more tests. Vaccines  Your health care provider may recommend certain vaccines, such as: Influenza vaccine. This is recommended every year. Tetanus, diphtheria, and acellular pertussis (Tdap, Td) vaccine. You may need a Td booster every 10 years. Zoster vaccine. You may need this after age 30. Pneumococcal 13-valent conjugate (PCV13) vaccine. One dose is recommended after age 65. Pneumococcal polysaccharide (PPSV23) vaccine. One dose is recommended after age 22. Talk to your health care provider about which screenings and vaccines you need and how often you need them. This information is not intended to replace advice given to you by your health care provider. Make sure you discuss any questions you have with your health care provider. Document Released: 11/05/2015 Document Revised: 06/28/2016 Document Reviewed: 08/10/2015 Elsevier Interactive Patient Education  2017 [place] Prevention in the Home Falls can cause injuries. They can happen to people of all ages. There are many things you can do to make your home safe and to help prevent falls. What can I do on the outside of my home? Regularly fix the edges of walkways and driveways and fix any cracks. Remove anything that might make you trip as you walk through a door, such as a raised step or threshold. Trim any bushes or trees on the path to your home. Use bright outdoor lighting. Clear any walking paths of anything that might make someone trip, such as rocks or tools. Regularly check to see if handrails are loose or broken. Make sure that both sides of any steps have handrails. Any raised decks and porches should have guardrails on  the edges. Have any leaves, snow, or ice cleared regularly. Use sand or salt on walking paths during winter. Clean up any spills in your garage right away. This includes oil or grease spills. What can I do in the bathroom? Use night lights. Install grab bars by the toilet and in the tub and shower. Do not use towel bars as grab bars. Use non-skid mats or decals in the tub or shower. If you need to sit down in the shower, use a plastic, non-slip stool. Keep the floor dry. Clean up any water that spills on the floor as soon as it happens. Remove soap buildup in the tub or shower regularly. Attach bath mats securely with double-sided non-slip rug tape. Do not have throw rugs and other things on the floor that can make you trip. What can I do in the bedroom? Use night lights. Make sure that you have a light by your bed that is easy to reach. Do not use any sheets or blankets that are too big for your bed. They should not hang down onto the floor. Have a firm chair that has side arms. You can use this  for support while you get dressed. Do not have throw rugs and other things on the floor that can make you trip. What can I do in the kitchen? Clean up any spills right away. Avoid walking on wet floors. Keep items that you use a lot in easy-to-reach places. If you need to reach something above you, use a strong step stool that has a grab bar. Keep electrical cords out of the way. Do not use floor polish or wax that makes floors slippery. If you must use wax, use non-skid floor wax. Do not have throw rugs and other things on the floor that can make you trip. What can I do with my stairs? Do not leave any items on the stairs. Make sure that there are handrails on both sides of the stairs and use them. Fix handrails that are broken or loose. Make sure that handrails are as long as the stairways. Check any carpeting to make sure that it is firmly attached to the stairs. Fix any carpet that is loose  or worn. Avoid having throw rugs at the top or bottom of the stairs. If you do have throw rugs, attach them to the floor with carpet tape. Make sure that you have a light switch at the top of the stairs and the bottom of the stairs. If you do not have them, ask someone to add them for you. What else can I do to help prevent falls? Wear shoes that: Do not have high heels. Have rubber bottoms. Are comfortable and fit you well. Are closed at the toe. Do not wear sandals. If you use a stepladder: Make sure that it is fully opened. Do not climb a closed stepladder. Make sure that both sides of the stepladder are locked into place. Ask someone to hold it for you, if possible. Clearly mark and make sure that you can see: Any grab bars or handrails. First and last steps. Where the edge of each step is. Use tools that help you move around (mobility aids) if they are needed. These include: Canes. Walkers. Scooters. Crutches. Turn on the lights when you go into a dark area. Replace any light bulbs as soon as they burn out. Set up your furniture so you have a clear path. Avoid moving your furniture around. If any of your floors are uneven, fix them. If there are any pets around you, be aware of where they are. Review your medicines with your doctor. Some medicines can make you feel dizzy. This can increase your chance of falling. Ask your doctor what other things that you can do to help prevent falls. This information is not intended to replace advice given to you by your health care provider. Make sure you discuss any questions you have with your health care provider. Document Released: 08/05/2009 Document Revised: 03/16/2016 Document Reviewed: 11/13/2014 Elsevier Interactive Patient Education  2017 [employer].

## 2023-01-22 ENCOUNTER — Telehealth: Payer: Self-pay | Admitting: Family Medicine

## 2023-01-22 NOTE — Telephone Encounter (Signed)
Patient called states that she had a case of diverticulitis on Saturday and asked that I let you know. She would like to know what she should do.  CB# [phone]

## 2023-01-23 ENCOUNTER — Encounter: Payer: Self-pay | Admitting: Family Medicine

## 2023-01-23 ENCOUNTER — Ambulatory Visit: Payer: Medicare Other | Admitting: Family Medicine

## 2023-01-23 ENCOUNTER — Other Ambulatory Visit: Payer: Self-pay | Admitting: Family Medicine

## 2023-01-23 VITALS — BP 122/64 | HR 87 | Temp 98.4°F | Ht 63.0 in | Wt 184.6 lb

## 2023-01-23 DIAGNOSIS — K5790 Diverticulosis of intestine, part unspecified, without perforation or abscess without bleeding: Secondary | ICD-10-CM | POA: Diagnosis not present

## 2023-01-23 MED ORDER — AMOXICILLIN-POT CLAVULANATE 875-125 MG PO TABS: 1.0000 | ORAL_TABLET | Freq: Two times a day (BID) | ORAL | 0 refills | Status: DC

## 2023-01-23 NOTE — Progress Notes (Signed)
Subjective:    Patient ID: Whitney Morales, female    DOB: 1941-02-25, 82 y.o.   MRN: [id number] Patient believes she has diverticulitis.  She has a history of diverticulitis.  Starting Saturday she developed left lower quadrant abdominal pain.  She states that she was extremely tender to palpation left lower quadrant.  She denies any melena or hematochezia.  She denies any fevers or chills.  She had a large bowel movement over the weekend and the pain began to improve.  Today she has minimal tenderness to palpation of the left lower quadrant Past Medical History:  Diagnosis Date   Abnormal finding on EKG 10/29/2012   FOLLOW UP NUCLEAR STRESS TEST ON 11/05/12 -NORMAL   Corneal erosion 2012   RIGHT EYE--HAS RESOLVED--NO PROBLEMS SINCE   Diabetes mellitus    Diverticulitis    Fatigue    GERD (gastroesophageal reflux disease)    PAST HX--NO PROBLEMS NOW-PT DOES TAKE DAILY TUMS AT BEDTIME   Goiter    cyst vs goiter on thyroid   H/O hiatal hernia    High cholesterol    Hypertension    Obesity    Osteopenia    Past Surgical History:  Procedure Laterality Date   ABDOMINAL HYSTERECTOMY     BREAST LUMPECTOMY     BENIGN   CARDIOVASCULAR STRESS TEST  06/08/2009   EF 78%, NO ISCHEMIA   THYROID LOBECTOMY  11/27/2012   Procedure: THYROID LOBECTOMY;  Surgeon: Ralene Ok, MD;  Location: [facility];  Service: General;  Laterality: Right;  Right Thyroid Lobectomy   TONSILLECTOMY     US ECHOCARDIOGRAPHY  03/23/2006   EF 55-60%   Current Outpatient Medications on File Prior to Visit  Medication Sig Dispense Refill   Accu-Chek Softclix Lancets lancets USE AS DIRECTED TO CHECK SUGAR 100 each 3   allopurinol (ZYLOPRIM) 100 MG tablet TAKE 2 TABLETS BY MOUTH EVERY DAY 120 tablet 0   amLODipine (NORVASC) 10 MG tablet Take 1 tablet (10 mg total) by mouth daily. 90 tablet 3   Blood Glucose Monitoring Suppl (ACCU-CHEK AVIVA PLUS) W/DEVICE KIT PT NEEDS METER-STRIPS(1 BOTTLE =100/5 REFILLS)-LANCETS(1 BOX/5  REFILLS) CHECKS BS QD DX: 250.00 1 kit 0   Calcium Carbonate (CALCIUM 500 PO) Take by mouth.     Cholecalciferol (VITAMIN D-3) 125 MCG (5000 UT) TABS Take by mouth.     furosemide (LASIX) 20 MG tablet TAKE 1 TABLET BY MOUTH EVERY DAY 90 tablet 0   glucose blood (ACCU-CHEK AVIVA PLUS) test strip CHECK BLOOD SUGAR TWICE DAILY FASTING IN MORNING, 2 HOURS AFTER A MEAL E11.65O 100 strip 3   levothyroxine (SYNTHROID) 88 MCG tablet Take 1 tablet (88 mcg total) by mouth daily. 90 tablet 3   lisinopril (ZESTRIL) 20 MG tablet Take 1 tablet (20 mg total) by mouth 2 (two) times daily. 180 tablet 3   metFORMIN (GLUCOPHAGE) 500 MG tablet TAKE TWO TABLETS BY MOUTH TWICE A DAY WITH A MEAL 360 tablet 3   Multiple Vitamin (MULTIVITAMIN WITH MINERALS) TABS Take 1 tablet by mouth daily.     nebivolol (BYSTOLIC) 5 MG tablet Take 1 tablet (5 mg total) by mouth daily. 90 tablet 3   rosuvastatin (CRESTOR) 20 MG tablet Take 1 tablet (20 mg total) by mouth daily. 90 tablet 3   Semaglutide, 1 MG/DOSE, 4 MG/3ML SOPN Inject 1 mg as directed once a week. 9 mL 3   terbinafine (LAMISIL) 250 MG tablet Take 1 tablet (250 mg total) by mouth daily. 30 tablet  2   vitamin B-12 (CYANOCOBALAMIN) 1000 MCG tablet Take 1,000 mcg by mouth daily.     vitamin E 180 MG (400 UNITS) capsule Take 400 Units by mouth daily.     No current facility-administered medications on file prior to visit.   Allergies  Allergen Reactions   Clindamycin/Lincomycin     CLINDAMYCIN TAKEN WITH CIPRO CAUSED SEVERE NAUSEA--PT STATES SHE CAN TAKE CIPRO ALONE WITHOUT PROBLEM--IT WAS JUST THE COMBINATION SHE DID NOT TOLERATE.   Levaquin [Levofloxacin] Nausea And Vomiting   Metronidazole Hcl Other (See Comments)    Deathly sick WHEN PT TOOK METRONIDAZOLE WITH CIPRO.  PT STATES SHE HAS TAKEN CIPRO ALONE WITHOUT PROBLEM --JUST DID NOT TOLERATE THE COMBINATION OF BOTH DRUGS TAKEN TOGETHER.   Social History   Socioeconomic History   Marital status: Married     Spouse name: Sonia Side   Number of children: 2   Years of education: Not on file   Highest education level: Not on file  Occupational History   Not on file  Tobacco Use   Smoking status: Never   Smokeless tobacco: Never  Substance and Sexual Activity   Alcohol use: No   Drug use: No   Sexual activity: Not Currently  Other Topics Concern   Not on file  Social History Narrative   2 sons   Lynnette Caffey and great grandchildren   Social Determinants of Health   Financial Resource Strain: Low Risk  (01/18/2023)   Overall Financial Resource Strain (CARDIA)    Difficulty of Paying Living Expenses: Not hard at all  Food Insecurity: No Food Insecurity (01/18/2023)   Hunger Vital Sign    Worried About Running Out of Food in the Last Year: Never true    Ran Out of Food in the Last Year: Never true  Transportation Needs: No Transportation Needs (01/18/2023)   PRAPARE - Hydrologist (Medical): No    Lack of Transportation (Non-Medical): No  Physical Activity: Sufficiently Active (01/18/2023)   Exercise Vital Sign    Days of Exercise per Week: 5 days    Minutes of Exercise per Session: 30 min  Stress: No Stress Concern Present (01/18/2023)   [place]    Feeling of Stress : Not at all  Social Connections: Moderately Integrated (01/18/2023)   Social Connection and Isolation Panel [NHANES]    Frequency of Communication with Friends and Family: More than three times a week    Frequency of Social Gatherings with Friends and Family: More than three times a week    Attends Religious Services: 1 to 4 times per year    Active Member of [employer] or Organizations: No    Attends Archivist Meetings: Never    Marital Status: Married  Human resources officer Violence: Not At Risk (01/18/2023)   Humiliation, Afraid, Rape, and Kick questionnaire    Fear of Current or Ex-Partner: No    Emotionally Abused: No     Physically Abused: No    Sexually Abused: No   No family history on file.     Review of Systems  All other systems reviewed and are negative.      Objective:   Physical Exam Vitals reviewed.  Constitutional:      General: She is not in acute distress.    Appearance: She is well-developed. She is not diaphoretic.  HENT:     Right Ear: External ear normal.     Left Ear: External  ear normal.     Nose: Nose normal.  Neck:     Thyroid: No thyromegaly.     Vascular: No JVD.  Cardiovascular:     Rate and Rhythm: Normal rate and regular rhythm.     Heart sounds: Normal heart sounds. No murmur heard. Pulmonary:     Effort: Pulmonary effort is normal. No respiratory distress.     Breath sounds: Normal breath sounds. No wheezing or rales.  Abdominal:     General: Bowel sounds are normal. There is no distension.     Palpations: Abdomen is soft. There is no mass.     Tenderness: There is no abdominal tenderness. There is no guarding or rebound.  Musculoskeletal:     Cervical back: Neck supple.     Right lower leg: No edema.     Left lower leg: No edema.  Skin:    Findings: No erythema or rash.  Neurological:     Mental Status: She is alert and oriented to person, place, and time.     Cranial Nerves: No cranial nerve deficit.     Motor: No abnormal muscle tone.     Coordination: Coordination normal.         Assessment & Plan:  Diverticulosis I believe the patient was having diverticular pain however the pain resolved spontaneously after defecation.  The present time no further treatment is necessary.  I did give the patient Augmentin to take 875 mg twice daily for 10 days if she develops severe persistent left lower quadrant abdominal pain or fevers.  However situation seems to have resolved spontaneously

## 2023-02-13 ENCOUNTER — Other Ambulatory Visit: Payer: Medicare Other

## 2023-02-13 DIAGNOSIS — I10 Essential (primary) hypertension: Secondary | ICD-10-CM

## 2023-02-13 DIAGNOSIS — N1831 Chronic kidney disease, stage 3a: Secondary | ICD-10-CM

## 2023-02-13 DIAGNOSIS — E1121 Type 2 diabetes mellitus with diabetic nephropathy: Secondary | ICD-10-CM

## 2023-02-14 LAB — COMPLETE METABOLIC PANEL WITH GFR
AG Ratio: 2 (calc) (ref 1.0–2.5)
ALT: 8 U/L (ref 6–29)
AST: 12 U/L (ref 10–35)
Albumin: 4.4 g/dL (ref 3.6–5.1)
Alkaline phosphatase (APISO): 48 U/L (ref 37–153)
BUN/Creatinine Ratio: 15 (calc) (ref 6–22)
BUN: 17 mg/dL (ref 7–25)
CO2: 27 mmol/L (ref 20–32)
Calcium: 9.6 mg/dL (ref 8.6–10.4)
Chloride: 107 mmol/L (ref 98–110)
Creat: 1.13 mg/dL — ABNORMAL HIGH (ref 0.60–1.00)
Globulin: 2.2 g/dL (calc) (ref 1.9–3.7)
Glucose, Bld: 100 mg/dL — ABNORMAL HIGH (ref 65–99)
Potassium: 5.4 mmol/L — ABNORMAL HIGH (ref 3.5–5.3)
Sodium: 145 mmol/L (ref 135–146)
Total Bilirubin: 0.3 mg/dL (ref 0.2–1.2)
Total Protein: 6.6 g/dL (ref 6.1–8.1)
eGFR: 49 mL/min/{1.73_m2} — ABNORMAL LOW (ref >=60)

## 2023-02-14 LAB — CBC WITH DIFFERENTIAL/PLATELET
Absolute Monocytes: 720 cells/uL (ref 200–950)
Basophils Absolute: 81 cells/uL (ref 0–200)
Basophils Relative: 0.9 %
Eosinophils Absolute: 144 cells/uL (ref 15–500)
Eosinophils Relative: 1.6 %
HCT: 35.8 % (ref 35.0–45.0)
Hemoglobin: 11.9 g/dL (ref 11.7–15.5)
Lymphs Abs: 2151 cells/uL (ref 850–3900)
MCH: 31.3 pg (ref 27.0–33.0)
MCHC: 33.2 g/dL (ref 32.0–36.0)
MCV: 94.2 fL (ref 80.0–100.0)
MPV: 10.5 fL (ref 7.5–12.5)
Monocytes Relative: 8 %
Neutro Abs: 5904 cells/uL (ref 1500–7800)
Neutrophils Relative %: 65.6 %
Platelets: 298 10*3/uL (ref 140–400)
RBC: 3.8 10*6/uL (ref 3.80–5.10)
RDW: 13 % (ref 11.0–15.0)
Total Lymphocyte: 23.9 %
WBC: 9 10*3/uL (ref 3.8–10.8)

## 2023-02-21 ENCOUNTER — Other Ambulatory Visit: Payer: Self-pay | Admitting: Family Medicine

## 2023-02-26 ENCOUNTER — Ambulatory Visit: Payer: Medicare Other | Admitting: Family Medicine

## 2023-02-26 ENCOUNTER — Encounter: Payer: Self-pay | Admitting: Family Medicine

## 2023-02-26 VITALS — BP 120/72 | HR 75 | Temp 98.5°F | Ht 63.0 in | Wt 181.8 lb

## 2023-02-26 DIAGNOSIS — L608 Other nail disorders: Secondary | ICD-10-CM

## 2023-02-26 NOTE — Progress Notes (Signed)
Subjective:    Patient ID: Whitney Morales, female    DOB: 02/13/1941, 82 y.o.   MRN: [id number] Please see my office visit from March.  The patient had a thick dysmorphic appearing yellow toenail.  I thought it may be onychomycosis so I started her on Lamisil.  Having completed more than 2 months of medication it is not improving at all.  I now believe that this is an acquired dysmorphic toenail.  The patient requested that I trimmed the toenail.  I was happy to do this with a pair of rongeurs Past Medical History:  Diagnosis Date   Abnormal finding on EKG 10/29/2012   FOLLOW UP NUCLEAR STRESS TEST ON 11/05/12 -NORMAL   Corneal erosion 2012   RIGHT EYE--HAS RESOLVED--NO PROBLEMS SINCE   Diabetes mellitus    Diverticulitis    Fatigue    GERD (gastroesophageal reflux disease)    PAST HX--NO PROBLEMS NOW-PT DOES TAKE DAILY TUMS AT BEDTIME   Goiter    cyst vs goiter on thyroid   H/O hiatal hernia    High cholesterol    Hypertension    Obesity    Osteopenia    Past Surgical History:  Procedure Laterality Date   ABDOMINAL HYSTERECTOMY     BREAST LUMPECTOMY     BENIGN   CARDIOVASCULAR STRESS TEST  06/08/2009   EF 78%, NO ISCHEMIA   THYROID LOBECTOMY  11/27/2012   Procedure: THYROID LOBECTOMY;  Surgeon: Axel Filler, MD;  Location: [facility];  Service: General;  Laterality: Right;  Right Thyroid Lobectomy   TONSILLECTOMY     US ECHOCARDIOGRAPHY  03/23/2006   EF 55-60%   Current Outpatient Medications on File Prior to Visit  Medication Sig Dispense Refill   Accu-Chek Softclix Lancets lancets USE AS DIRECTED TO CHECK SUGAR 100 each 3   allopurinol (ZYLOPRIM) 100 MG tablet TAKE 2 TABLETS BY MOUTH EVERY DAY 120 tablet 0   amLODipine (NORVASC) 10 MG tablet Take 1 tablet (10 mg total) by mouth daily. 90 tablet 3   amoxicillin-clavulanate (AUGMENTIN) 875-125 MG tablet Take 1 tablet by mouth 2 (two) times daily. 20 tablet 0   Blood Glucose Monitoring Suppl (ACCU-CHEK AVIVA PLUS) W/DEVICE KIT PT  NEEDS METER-STRIPS(1 BOTTLE =100/5 REFILLS)-LANCETS(1 BOX/5 REFILLS) CHECKS BS QD DX: 250.00 1 kit 0   furosemide (LASIX) 20 MG tablet TAKE 1 TABLET BY MOUTH EVERY DAY 90 tablet 0   glucose blood (ACCU-CHEK AVIVA PLUS) test strip CHECK BLOOD SUGAR TWICE DAILY FASTING IN MORNING, 2 HOURS AFTER A MEAL E11.65O 100 strip 3   levothyroxine (SYNTHROID) 88 MCG tablet Take 1 tablet (88 mcg total) by mouth daily. 90 tablet 3   lisinopril (ZESTRIL) 20 MG tablet Take 1 tablet (20 mg total) by mouth 2 (two) times daily. 180 tablet 3   metFORMIN (GLUCOPHAGE) 500 MG tablet TAKE TWO TABLETS BY MOUTH TWICE A DAY WITH A MEAL 360 tablet 3   Multiple Vitamin (MULTIVITAMIN WITH MINERALS) TABS Take 1 tablet by mouth daily.     nebivolol (BYSTOLIC) 5 MG tablet Take 1 tablet (5 mg total) by mouth daily. 90 tablet 3   rosuvastatin (CRESTOR) 20 MG tablet Take 1 tablet (20 mg total) by mouth daily. 90 tablet 3   Semaglutide, 1 MG/DOSE, 4 MG/3ML SOPN Inject 1 mg as directed once a week. 9 mL 3   terbinafine (LAMISIL) 250 MG tablet Take 1 tablet (250 mg total) by mouth daily. 30 tablet 2   vitamin E 180 MG (400 UNITS)  capsule Take 400 Units by mouth daily.     Calcium Carbonate (CALCIUM 500 PO) Take by mouth. (Patient not taking: Reported on 02/26/2023)     Cholecalciferol (VITAMIN D-3) 125 MCG (5000 UT) TABS Take by mouth. (Patient not taking: Reported on 02/26/2023)     vitamin B-12 (CYANOCOBALAMIN) 1000 MCG tablet Take 1,000 mcg by mouth daily. (Patient not taking: Reported on 02/26/2023)     No current facility-administered medications on file prior to visit.   Allergies  Allergen Reactions   Clindamycin/Lincomycin     CLINDAMYCIN TAKEN WITH CIPRO CAUSED SEVERE NAUSEA--PT STATES SHE CAN TAKE CIPRO ALONE WITHOUT PROBLEM--IT WAS JUST THE COMBINATION SHE DID NOT TOLERATE.   Levaquin [Levofloxacin] Nausea And Vomiting   Metronidazole Hcl Other (See Comments)    Deathly sick WHEN PT TOOK METRONIDAZOLE WITH CIPRO.  PT STATES  SHE HAS TAKEN CIPRO ALONE WITHOUT PROBLEM --JUST DID NOT TOLERATE THE COMBINATION OF BOTH DRUGS TAKEN TOGETHER.   Social History   Socioeconomic History   Marital status: Married    Spouse name: Dorene Sorrow   Number of children: 2   Years of education: Not on file   Highest education level: Not on file  Occupational History   Not on file  Tobacco Use   Smoking status: Never   Smokeless tobacco: Never  Substance and Sexual Activity   Alcohol use: No   Drug use: No   Sexual activity: Not Currently  Other Topics Concern   Not on file  Social History Narrative   2 sons   Hayes Ludwig and great grandchildren   Social Determinants of Health   Financial Resource Strain: Low Risk  (01/18/2023)   Overall Financial Resource Strain (CARDIA)    Difficulty of Paying Living Expenses: Not hard at all  Food Insecurity: No Food Insecurity (01/18/2023)   Hunger Vital Sign    Worried About Running Out of Food in the Last Year: Never true    Ran Out of Food in the Last Year: Never true  Transportation Needs: No Transportation Needs (01/18/2023)   PRAPARE - Administrator, Civil Service (Medical): No    Lack of Transportation (Non-Medical): No  Physical Activity: Sufficiently Active (01/18/2023)   Exercise Vital Sign    Days of Exercise per Week: 5 days    Minutes of Exercise per Session: 30 min  Stress: No Stress Concern Present (01/18/2023)   [employer] of Occupational Health - Occupational Stress Questionnaire    Feeling of Stress : Not at all  Social Connections: Moderately Integrated (01/18/2023)   Social Connection and Isolation Panel [NHANES]    Frequency of Communication with Friends and Family: More than three times a week    Frequency of Social Gatherings with Friends and Family: More than three times a week    Attends Religious Services: 1 to 4 times per year    Active Member of [employer] or Organizations: No    Attends Banker Meetings: Never    Marital  Status: Married  Catering manager Violence: Not At Risk (01/18/2023)   Humiliation, Afraid, Rape, and Kick questionnaire    Fear of Current or Ex-Partner: No    Emotionally Abused: No    Physically Abused: No    Sexually Abused: No   No family history on file.     Review of Systems  All other systems reviewed and are negative.      Objective:   Physical Exam Vitals reviewed.  Constitutional:  General: She is not in acute distress.    Appearance: She is well-developed. She is not diaphoretic.  HENT:     Right Ear: External ear normal.     Left Ear: External ear normal.     Nose: Nose normal.  Neck:     Thyroid: No thyromegaly.     Vascular: No JVD.  Cardiovascular:     Rate and Rhythm: Normal rate and regular rhythm.     Heart sounds: Normal heart sounds. No murmur heard. Pulmonary:     Effort: Pulmonary effort is normal. No respiratory distress.     Breath sounds: Normal breath sounds. No wheezing or rales.  Abdominal:     General: Bowel sounds are normal. There is no distension.     Palpations: Abdomen is soft. There is no mass.     Tenderness: There is no abdominal tenderness. There is no guarding or rebound.  Musculoskeletal:     Cervical back: Neck supple.     Right lower leg: No edema.     Left lower leg: No edema.  Skin:    Findings: No erythema or rash.  Neurological:     Mental Status: She is alert and oriented to person, place, and time.     Cranial Nerves: No cranial nerve deficit.     Motor: No abnormal muscle tone.     Coordination: Coordination normal.   Please see the description in the history of present illness        Assessment & Plan:  Acquired dysmorphic toenail I trimmed the toenail with a pair of rongeurs.  Discontinue Lamisil as it is not helping.  Repeat fasting lab work in August regarding her diabetes.

## 2023-03-23 ENCOUNTER — Ambulatory Visit (INDEPENDENT_AMBULATORY_CARE_PROVIDER_SITE_OTHER): Payer: Medicare Other

## 2023-03-23 ENCOUNTER — Other Ambulatory Visit: Payer: Self-pay | Admitting: Family Medicine

## 2023-03-23 DIAGNOSIS — Z23 Encounter for immunization: Secondary | ICD-10-CM

## 2023-03-23 NOTE — Telephone Encounter (Signed)
Requested Prescriptions  Pending Prescriptions Disp Refills   allopurinol (ZYLOPRIM) 100 MG tablet [Pharmacy Med Name: ALLOPURINOL 100 MG TABLET] 120 tablet 0    Sig: TAKE 2 TABLETS BY MOUTH EVERY DAY     Endocrinology:  Gout Agents - allopurinol Failed - 03/23/2023 12:17 AM      Failed - Uric Acid in normal range and within 360 days    Uric Acid, Serum  Date Value Ref Range Status  05/01/2018 6.3 2.5 - 7.0 mg/dL Final    Comment:    Therapeutic target for gout patients: <6.0 mg/dL .          Failed - Cr in normal range and within 360 days    Creat  Date Value Ref Range Status  02/13/2023 1.13 (H) 0.60 - 1.00 mg/dL Final   Creatinine, Urine  Date Value Ref Range Status  09/19/2022 225 20 - 275 mg/dL Final         Failed - Valid encounter within last 12 months    Recent Outpatient Visits           1 year ago Essential hypertension   [facility], Priscille Heidelberg, MD   1 year ago Essential hypertension   [facility], Priscille Heidelberg, MD   1 year ago Essential hypertension   [facility], Priscille Heidelberg, MD   1 year ago Essential hypertension   [facility], Priscille Heidelberg, MD   1 year ago Essential hypertension   [facility], Priscille Heidelberg, MD              Passed - CBC within normal limits and completed in the last 12 months    WBC  Date Value Ref Range Status  02/13/2023 9.0 3.8 - 10.8 Thousand/uL Final   RBC  Date Value Ref Range Status  02/13/2023 3.80 3.80 - 5.10 Million/uL Final   Hemoglobin  Date Value Ref Range Status  02/13/2023 11.9 11.7 - 15.5 g/dL Final   HCT  Date Value Ref Range Status  02/13/2023 35.8 35.0 - 45.0 % Final   MCHC  Date Value Ref Range Status  02/13/2023 33.2 32.0 - 36.0 g/dL Final   [facility]  Date Value Ref Range Status  02/13/2023 31.3 27.0 - 33.0 pg Final   MCV  Date Value Ref Range Status  02/13/2023 94.2 80.0 - 100.0 fL Final    No results found for: "PLTCOUNTKUC", "LABPLAT", "POCPLA" RDW  Date Value Ref Range Status  02/13/2023 13.0 11.0 - 15.0 % Final

## 2023-03-23 NOTE — Progress Notes (Signed)
Boostrix given per pt, storm door cath the back of her R-heel

## 2023-04-23 ENCOUNTER — Telehealth: Payer: Self-pay

## 2023-04-23 NOTE — Telephone Encounter (Signed)
Called spoke w/pt and advise pt to come in to sigh correct patiet acknowledgement for for Boostrix. (1st one is incorrect). Per pt voiced understanding and will stop by office to sign form.

## 2023-05-11 ENCOUNTER — Telehealth: Payer: Self-pay | Admitting: Family Medicine

## 2023-05-11 NOTE — Telephone Encounter (Signed)
Patient stated she thinks she has a UTI. As discussed with provider, she reached out to make him aware. Patient advised to go to UC (no appointment slots available today). Patient verbalized understanding.

## 2023-05-14 ENCOUNTER — Other Ambulatory Visit: Payer: Self-pay | Admitting: Family Medicine

## 2023-05-19 ENCOUNTER — Other Ambulatory Visit: Payer: Self-pay | Admitting: Family Medicine

## 2023-05-28 ENCOUNTER — Other Ambulatory Visit: Payer: Self-pay | Admitting: Family Medicine

## 2023-05-28 DIAGNOSIS — E1121 Type 2 diabetes mellitus with diabetic nephropathy: Secondary | ICD-10-CM

## 2023-05-29 NOTE — Telephone Encounter (Signed)
No longer current dosing on these medications Requested Prescriptions  Pending Prescriptions Disp Refills   glucose blood (ACCU-CHEK AVIVA PLUS) test strip [Pharmacy Med Name: ACCU-CHEK AVIVA PLUS TEST STRP] 100 strip 3    Sig: CHECK BLOOD SUGAR TWICE DAILY FASTING IN MORNING AND THEN 2 HOURS AFTER A MEAL E11.65O     Endocrinology: Diabetes - Testing Supplies Failed - 05/28/2023  3:42 PM      Failed - Valid encounter within last 12 months    Recent Outpatient Visits           1 year ago Essential hypertension   [facility] Pickard, Priscille Heidelberg, MD   1 year ago Essential hypertension   [facility] Pickard, Priscille Heidelberg, MD   1 year ago Essential hypertension   [facility] Tanya Nones, Priscille Heidelberg, MD   1 year ago Essential hypertension   [facility] Tanya Nones, Priscille Heidelberg, MD   1 year ago Essential hypertension   [facility] Pickard, Priscille Heidelberg, MD              Refused Prescriptions Disp Refills   metFORMIN (GLUCOPHAGE) 1000 MG tablet [Pharmacy Med Name: METFORMIN HCL 1,000 MG TABLET] 180 tablet 3    Sig: TAKE 1 TABLET BY MOUTH TWICE A DAY WITH A MEAL     Endocrinology:  Diabetes - Biguanides Failed - 05/28/2023  3:42 PM      Failed - Cr in normal range and within 360 days    Creat  Date Value Ref Range Status  02/13/2023 1.13 (H) 0.60 - 1.00 mg/dL Final   Creatinine, Urine  Date Value Ref Range Status  09/19/2022 225 20 - 275 mg/dL Final         Failed - eGFR in normal range and within 360 days    GFR, Est African American  Date Value Ref Range Status  12/14/2020 62 > OR = 60 mL/min/1.30m2 Final   GFR, Est Non African American  Date Value Ref Range Status  12/14/2020 54 (L) > OR = 60 mL/min/1.77m2 Final   GFR, Estimated  Date Value Ref Range Status  09/30/2021 45 (L) >60 mL/min Final    Comment:    (NOTE) Calculated using the CKD-EPI Creatinine Equation (2021)    eGFR  Date Value Ref Range  Status  02/13/2023 49 (L) > OR = 60 mL/min/1.22m2 Final         Failed - B12 Level in normal range and within 720 days    No results found for: "VITAMINB12"       Failed - Valid encounter within last 6 months    Recent Outpatient Visits           1 year ago Essential hypertension   [facility] Donita Brooks, MD   1 year ago Essential hypertension   [facility] Donita Brooks, MD   1 year ago Essential hypertension   [facility] Donita Brooks, MD   1 year ago Essential hypertension   [facility] Donita Brooks, MD   1 year ago Essential hypertension   [facility] Pickard, Priscille Heidelberg, MD              Passed - HBA1C is between 0 and 7.9 and within 180 days    Hgb A1c MFr Bld  Date Value Ref Range Status  11/30/2022 5.9 (H) <5.7 % of total  Hgb Final    Comment:    For someone without known diabetes, a hemoglobin  A1c value between 5.7% and 6.4% is consistent with prediabetes and should be confirmed with a  follow-up test. . For someone with known diabetes, a value <7% indicates that their diabetes is well controlled. A1c targets should be individualized based on duration of diabetes, age, comorbid conditions, and other considerations. . This assay result is consistent with an increased risk of diabetes. . Currently, no consensus exists regarding use of hemoglobin A1c for diagnosis of diabetes for children. .          Passed - CBC within normal limits and completed in the last 12 months    WBC  Date Value Ref Range Status  02/13/2023 9.0 3.8 - 10.8 Thousand/uL Final   RBC  Date Value Ref Range Status  02/13/2023 3.80 3.80 - 5.10 Million/uL Final   Hemoglobin  Date Value Ref Range Status  02/13/2023 11.9 11.7 - 15.5 g/dL Final   HCT  Date Value Ref Range Status  02/13/2023 35.8 35.0 - 45.0 % Final   MCHC  Date Value Ref Range Status  02/13/2023 33.2 32.0 -  36.0 g/dL Final   [facility]  Date Value Ref Range Status  02/13/2023 31.3 27.0 - 33.0 pg Final   MCV  Date Value Ref Range Status  02/13/2023 94.2 80.0 - 100.0 fL Final   No results found for: "PLTCOUNTKUC", "LABPLAT", "POCPLA" RDW  Date Value Ref Range Status  02/13/2023 13.0 11.0 - 15.0 % Final          OZEMPIC, 0.25 OR 0.5 MG/DOSE, 2 MG/3ML SOPN [Pharmacy Med Name: OZEMPIC 0.25-0.5 MG/DOSE PEN]  1    Sig: INJECT 0.5 MG INTO THE SKIN ONCE A WEEK.     Endocrinology:  Diabetes - GLP-1 Receptor Agonists - semaglutide Failed - 05/28/2023  3:42 PM      Failed - HBA1C in normal range and within 180 days    Hgb A1c MFr Bld  Date Value Ref Range Status  11/30/2022 5.9 (H) <5.7 % of total Hgb Final    Comment:    For someone without known diabetes, a hemoglobin  A1c value between 5.7% and 6.4% is consistent with prediabetes and should be confirmed with a  follow-up test. . For someone with known diabetes, a value <7% indicates that their diabetes is well controlled. A1c targets should be individualized based on duration of diabetes, age, comorbid conditions, and other considerations. . This assay result is consistent with an increased risk of diabetes. . Currently, no consensus exists regarding use of hemoglobin A1c for diagnosis of diabetes for children. .          Failed - Cr in normal range and within 360 days    Creat  Date Value Ref Range Status  02/13/2023 1.13 (H) 0.60 - 1.00 mg/dL Final   Creatinine, Urine  Date Value Ref Range Status  09/19/2022 225 20 - 275 mg/dL Final         Failed - Valid encounter within last 6 months    Recent Outpatient Visits           1 year ago Essential hypertension   [facility] Pickard, Priscille Heidelberg, MD   1 year ago Essential hypertension   [facility] Pickard, Priscille Heidelberg, MD   1 year ago Essential hypertension   [facility] Pickard, Priscille Heidelberg, MD   1 year ago Essential hypertension  [facility] Pickard, Priscille Heidelberg, MD   1 year ago Essential hypertension   [facility] Pickard, Priscille Heidelberg, MD

## 2023-06-05 ENCOUNTER — Other Ambulatory Visit: Payer: Self-pay | Admitting: Family Medicine

## 2023-06-06 NOTE — Telephone Encounter (Signed)
Appointment 07/09/23 Requested Prescriptions  Pending Prescriptions Disp Refills   furosemide (LASIX) 20 MG tablet [Pharmacy Med Name: FUROSEMIDE 20 MG TABLET] 90 tablet 0    Sig: TAKE 1 TABLET BY MOUTH EVERY DAY     Cardiovascular:  Diuretics - Loop Failed - 06/05/2023 10:05 AM      Failed - K in normal range and within 180 days    Potassium  Date Value Ref Range Status  02/13/2023 5.4 (H) 3.5 - 5.3 mmol/L Final         Failed - Cr in normal range and within 180 days    Creat  Date Value Ref Range Status  02/13/2023 1.13 (H) 0.60 - 1.00 mg/dL Final   Creatinine, Urine  Date Value Ref Range Status  09/19/2022 225 20 - 275 mg/dL Final         Failed - Mg Level in normal range and within 180 days    No results found for: "MG"       Failed - Valid encounter within last 6 months    Recent Outpatient Visits           1 year ago Essential hypertension   [facility] Pickard, Priscille Heidelberg, MD   1 year ago Essential hypertension   [facility] Pickard, Priscille Heidelberg, MD   1 year ago Essential hypertension   [facility] Donita Brooks, MD   1 year ago Essential hypertension   [facility] Pickard, Priscille Heidelberg, MD   1 year ago Essential hypertension   [facility] Pickard, Priscille Heidelberg, MD       Future Appointments             In 1 month Pickard, Priscille Heidelberg, MD [facility]            Passed - Ca in normal range and within 180 days    Calcium  Date Value Ref Range Status  02/13/2023 9.6 8.6 - 10.4 mg/dL Final         Passed - Na in normal range and within 180 days    Sodium  Date Value Ref Range Status  02/13/2023 145 135 - 146 mmol/L Final         Passed - Cl in normal range and within 180 days    Chloride  Date Value Ref Range Status  02/13/2023 107 98 - 110 mmol/L Final         Passed - Last BP in normal range    BP Readings from Last 1 Encounters:   02/26/23 120/72

## 2023-06-13 ENCOUNTER — Other Ambulatory Visit: Payer: Self-pay | Admitting: Family Medicine

## 2023-06-13 ENCOUNTER — Other Ambulatory Visit: Payer: Self-pay

## 2023-06-13 DIAGNOSIS — E1121 Type 2 diabetes mellitus with diabetic nephropathy: Secondary | ICD-10-CM

## 2023-06-13 MED ORDER — METFORMIN HCL 500 MG PO TABS
ORAL_TABLET | ORAL | 1 refills | Status: DC
Start: 2023-06-13 — End: 2023-07-09

## 2023-06-14 NOTE — Telephone Encounter (Signed)
Medication was refilled 06/13/23, duplicate request.  Requested Prescriptions  Pending Prescriptions Disp Refills   metFORMIN (GLUCOPHAGE) 1000 MG tablet [Pharmacy Med Name: METFORMIN HCL 1,000 MG TABLET] 180 tablet 3    Sig: TAKE 1 TABLET BY MOUTH TWICE A DAY WITH A MEAL     Endocrinology:  Diabetes - Biguanides Failed - 06/13/2023  9:39 AM      Failed - Cr in normal range and within 360 days    Creat  Date Value Ref Range Status  02/13/2023 1.13 (H) 0.60 - 1.00 mg/dL Final   Creatinine, Urine  Date Value Ref Range Status  09/19/2022 225 20 - 275 mg/dL Final         Failed - HBA1C is between 0 and 7.9 and within 180 days    Hgb A1c MFr Bld  Date Value Ref Range Status  11/30/2022 5.9 (H) <5.7 % of total Hgb Final    Comment:    For someone without known diabetes, a hemoglobin  A1c value between 5.7% and 6.4% is consistent with prediabetes and should be confirmed with a  follow-up test. . For someone with known diabetes, a value <7% indicates that their diabetes is well controlled. A1c targets should be individualized based on duration of diabetes, age, comorbid conditions, and other considerations. . This assay result is consistent with an increased risk of diabetes. . Currently, no consensus exists regarding use of hemoglobin A1c for diagnosis of diabetes for children. .          Failed - eGFR in normal range and within 360 days    GFR, Est African American  Date Value Ref Range Status  12/14/2020 62 > OR = 60 mL/min/1.66m2 Final   GFR, Est Non African American  Date Value Ref Range Status  12/14/2020 54 (L) > OR = 60 mL/min/1.15m2 Final   GFR, Estimated  Date Value Ref Range Status  09/30/2021 45 (L) >60 mL/min Final    Comment:    (NOTE) Calculated using the CKD-EPI Creatinine Equation (2021)    eGFR  Date Value Ref Range Status  02/13/2023 49 (L) > OR = 60 mL/min/1.74m2 Final         Failed - B12 Level in normal range and within 720 days    No  results found for: "VITAMINB12"       Failed - Valid encounter within last 6 months    Recent Outpatient Visits           1 year ago Essential hypertension   [facility] Pickard, Priscille Heidelberg, MD   1 year ago Essential hypertension   [facility] Tanya Nones, Priscille Heidelberg, MD   1 year ago Essential hypertension   [facility] Tanya Nones, Priscille Heidelberg, MD   1 year ago Essential hypertension   [facility] Tanya Nones, Priscille Heidelberg, MD   1 year ago Essential hypertension   [facility] Pickard, Priscille Heidelberg, MD       Future Appointments             In 3 weeks Pickard, Priscille Heidelberg, MD [facility]            Passed - CBC within normal limits and completed in the last 12 months    WBC  Date Value Ref Range Status  02/13/2023 9.0 3.8 - 10.8 Thousand/uL Final   RBC  Date Value Ref Range Status  02/13/2023 3.80 3.80 - 5.10  Million/uL Final   Hemoglobin  Date Value Ref Range Status  02/13/2023 11.9 11.7 - 15.5 g/dL Final   HCT  Date Value Ref Range Status  02/13/2023 35.8 35.0 - 45.0 % Final   MCHC  Date Value Ref Range Status  02/13/2023 33.2 32.0 - 36.0 g/dL Final   [facility]  Date Value Ref Range Status  02/13/2023 31.3 27.0 - 33.0 pg Final   MCV  Date Value Ref Range Status  02/13/2023 94.2 80.0 - 100.0 fL Final   No results found for: "PLTCOUNTKUC", "LABPLAT", "POCPLA" RDW  Date Value Ref Range Status  02/13/2023 13.0 11.0 - 15.0 % Final

## 2023-06-15 ENCOUNTER — Other Ambulatory Visit: Payer: Self-pay | Admitting: Family Medicine

## 2023-06-15 DIAGNOSIS — N1831 Chronic kidney disease, stage 3a: Secondary | ICD-10-CM

## 2023-06-15 DIAGNOSIS — I10 Essential (primary) hypertension: Secondary | ICD-10-CM

## 2023-06-18 NOTE — Telephone Encounter (Signed)
Requested Prescriptions  Pending Prescriptions Disp Refills   amLODipine (NORVASC) 10 MG tablet [Pharmacy Med Name: AMLODIPINE BESYLATE 10 MG TAB] 90 tablet 3    Sig: TAKE 1 TABLET BY MOUTH EVERY DAY     Cardiovascular: Calcium Channel Blockers 2 Failed - 06/15/2023 11:19 AM      Failed - Valid encounter within last 6 months    Recent Outpatient Visits           1 year ago Essential hypertension   [facility] Pickard, Priscille Heidelberg, MD   1 year ago Essential hypertension   [facility] Pickard, Priscille Heidelberg, MD   1 year ago Essential hypertension   [facility] Tanya Nones, Priscille Heidelberg, MD   1 year ago Essential hypertension   [facility] Pickard, Priscille Heidelberg, MD   1 year ago Essential hypertension   [facility] Pickard, Priscille Heidelberg, MD       Future Appointments             In 3 weeks Pickard, Priscille Heidelberg, MD [facility]            Passed - Last BP in normal range    BP Readings from Last 1 Encounters:  02/26/23 120/72         Passed - Last Heart Rate in normal range    Pulse Readings from Last 1 Encounters:  02/26/23 75

## 2023-07-05 ENCOUNTER — Other Ambulatory Visit: Payer: Medicare Other

## 2023-07-05 DIAGNOSIS — Z0000 Encounter for general adult medical examination without abnormal findings: Secondary | ICD-10-CM

## 2023-07-05 DIAGNOSIS — E119 Type 2 diabetes mellitus without complications: Secondary | ICD-10-CM

## 2023-07-05 DIAGNOSIS — E039 Hypothyroidism, unspecified: Secondary | ICD-10-CM

## 2023-07-06 LAB — CBC WITH DIFFERENTIAL/PLATELET
Absolute Monocytes: 747 {cells}/uL (ref 200–950)
Basophils Absolute: 87 {cells}/uL (ref 0–200)
Basophils Relative: 0.9 %
Eosinophils Absolute: 204 {cells}/uL (ref 15–500)
Eosinophils Relative: 2.1 %
HCT: 35.9 % (ref 35.0–45.0)
Hemoglobin: 11.9 g/dL (ref 11.7–15.5)
Lymphs Abs: 2755 {cells}/uL (ref 850–3900)
MCH: 31.2 pg (ref 27.0–33.0)
MCHC: 33.1 g/dL (ref 32.0–36.0)
MCV: 94 fL (ref 80.0–100.0)
MPV: 10.4 fL (ref 7.5–12.5)
Monocytes Relative: 7.7 %
Neutro Abs: 5907 {cells}/uL (ref 1500–7800)
Neutrophils Relative %: 60.9 %
Platelets: 268 10*3/uL (ref 140–400)
RBC: 3.82 10*6/uL (ref 3.80–5.10)
RDW: 12.8 % (ref 11.0–15.0)
Total Lymphocyte: 28.4 %
WBC: 9.7 10*3/uL (ref 3.8–10.8)

## 2023-07-06 LAB — COMPLETE METABOLIC PANEL WITH GFR
AG Ratio: 1.6 (calc) (ref 1.0–2.5)
ALT: 11 U/L (ref 6–29)
AST: 14 U/L (ref 10–35)
Albumin: 4.2 g/dL (ref 3.6–5.1)
Alkaline phosphatase (APISO): 50 U/L (ref 37–153)
BUN/Creatinine Ratio: 14 (calc) (ref 6–22)
BUN: 14 mg/dL (ref 7–25)
CO2: 27 mmol/L (ref 20–32)
Calcium: 9.6 mg/dL (ref 8.6–10.4)
Chloride: 107 mmol/L (ref 98–110)
Creat: 1.03 mg/dL — ABNORMAL HIGH (ref 0.60–0.95)
Globulin: 2.7 g/dL (ref 1.9–3.7)
Glucose, Bld: 88 mg/dL (ref 65–99)
Potassium: 4.6 mmol/L (ref 3.5–5.3)
Sodium: 143 mmol/L (ref 135–146)
Total Bilirubin: 0.3 mg/dL (ref 0.2–1.2)
Total Protein: 6.9 g/dL (ref 6.1–8.1)
eGFR: 55 mL/min/{1.73_m2} — ABNORMAL LOW (ref >=60)

## 2023-07-06 LAB — LIPID PANEL
Cholesterol: 89 mg/dL (ref <200)
HDL: 37 mg/dL — ABNORMAL LOW (ref >=50)
LDL Cholesterol (Calc): 35 mg/dL
Non-HDL Cholesterol (Calc): 52 mg/dL (ref <130)
Total CHOL/HDL Ratio: 2.4 (calc) (ref <5.0)
Triglycerides: 83 mg/dL (ref <150)

## 2023-07-06 LAB — HEMOGLOBIN A1C
Hgb A1c MFr Bld: 5.9 %{Hb} — ABNORMAL HIGH (ref <5.7)
Mean Plasma Glucose: 123 mg/dL
eAG (mmol/L): 6.8 mmol/L

## 2023-07-06 LAB — THYROID PANEL WITH TSH
Free Thyroxine Index: 2.8 (ref 1.4–3.8)
T3 Uptake: 29 % (ref 22–35)
T4, Total: 9.5 ug/dL (ref 5.1–11.9)
TSH: 1.11 m[IU]/L (ref 0.40–4.50)

## 2023-07-06 LAB — MICROALBUMIN / CREATININE URINE RATIO
Creatinine, Urine: 157 mg/dL (ref 20–275)
Microalb Creat Ratio: 34 mg/g{creat} — ABNORMAL HIGH (ref <30)
Microalb, Ur: 5.3 mg/dL

## 2023-07-09 ENCOUNTER — Ambulatory Visit: Payer: Medicare Other | Admitting: Family Medicine

## 2023-07-09 VITALS — BP 132/78 | HR 79 | Temp 97.8°F | Ht 63.0 in | Wt 182.6 lb

## 2023-07-09 DIAGNOSIS — E1121 Type 2 diabetes mellitus with diabetic nephropathy: Secondary | ICD-10-CM

## 2023-07-09 DIAGNOSIS — Z0001 Encounter for general adult medical examination with abnormal findings: Secondary | ICD-10-CM | POA: Diagnosis not present

## 2023-07-09 DIAGNOSIS — I10 Essential (primary) hypertension: Secondary | ICD-10-CM | POA: Diagnosis not present

## 2023-07-09 DIAGNOSIS — Z7985 Long-term (current) use of injectable non-insulin antidiabetic drugs: Secondary | ICD-10-CM

## 2023-07-09 DIAGNOSIS — E7800 Pure hypercholesterolemia, unspecified: Secondary | ICD-10-CM | POA: Diagnosis not present

## 2023-07-09 DIAGNOSIS — Z0000 Encounter for general adult medical examination without abnormal findings: Secondary | ICD-10-CM

## 2023-07-09 DIAGNOSIS — E038 Other specified hypothyroidism: Secondary | ICD-10-CM

## 2023-07-09 NOTE — Progress Notes (Signed)
Subjective:    Patient ID: Whitney Morales, female    DOB: 20-Aug-1941, 82 y.o.   MRN: [id number]  Patient is a very pleasant 82 year old Caucasian female here today for complete physical exam.  She is due for a flu shot, COVID booster, and the shingles vaccine.  I recommended all these to her today.  Her mammogram is up-to-date.  Due to her age she does not require colonoscopy.  She does not require pap smear.  Her most recent lab work is listed below.  She denies any falls or depression or memory loss. Lab on 07/05/2023  Component Date Value Ref Range Status   WBC 07/05/2023 9.7  3.8 - 10.8 Thousand/uL Final   RBC 07/05/2023 3.82  3.80 - 5.10 Million/uL Final   Hemoglobin 07/05/2023 11.9  11.7 - 15.5 g/dL Final   HCT 40/98/1191 35.9  35.0 - 45.0 % Final   MCV 07/05/2023 94.0  80.0 - 100.0 fL Final   MCH 07/05/2023 31.2  27.0 - 33.0 pg Final   MCHC 07/05/2023 33.1  32.0 - 36.0 g/dL Final   RDW 47/82/9562 12.8  11.0 - 15.0 % Final   Platelets 07/05/2023 268  140 - 400 Thousand/uL Final   MPV 07/05/2023 10.4  7.5 - 12.5 fL Final   Neutro Abs 07/05/2023 5,907  1,500 - 7,800 cells/uL Final   Lymphs Abs 07/05/2023 2,755  850 - 3,900 cells/uL Final   Absolute Monocytes 07/05/2023 747  200 - 950 cells/uL Final   Eosinophils Absolute 07/05/2023 204  15 - 500 cells/uL Final   Basophils Absolute 07/05/2023 87  0 - 200 cells/uL Final   Neutrophils Relative % 07/05/2023 60.9  % Final   Total Lymphocyte 07/05/2023 28.4  % Final   Monocytes Relative 07/05/2023 7.7  % Final   Eosinophils Relative 07/05/2023 2.1  % Final   Basophils Relative 07/05/2023 0.9  % Final   Glucose, Bld 07/05/2023 88  65 - 99 mg/dL Final   Comment: .            Fasting reference interval .    BUN 07/05/2023 14  7 - 25 mg/dL Final   Creat 13/05/6577 1.03 (H)  0.60 - 0.95 mg/dL Final   eGFR 46/96/2952 55 (L)  > OR = 60 mL/min/1.69m2 Final   BUN/Creatinine Ratio 07/05/2023 14  6 - 22 (calc) Final   Sodium 07/05/2023 143   135 - 146 mmol/L Final   Potassium 07/05/2023 4.6  3.5 - 5.3 mmol/L Final   Chloride 07/05/2023 107  98 - 110 mmol/L Final   CO2 07/05/2023 27  20 - 32 mmol/L Final   Calcium 07/05/2023 9.6  8.6 - 10.4 mg/dL Final   Total Protein 84/13/2440 6.9  6.1 - 8.1 g/dL Final   Albumin 08/19/2535 4.2  3.6 - 5.1 g/dL Final   Globulin 64/40/3474 2.7  1.9 - 3.7 g/dL (calc) Final   AG Ratio 07/05/2023 1.6  1.0 - 2.5 (calc) Final   Total Bilirubin 07/05/2023 0.3  0.2 - 1.2 mg/dL Final   Alkaline phosphatase (APISO) 07/05/2023 50  37 - 153 U/L Final   AST 07/05/2023 14  10 - 35 U/L Final   ALT 07/05/2023 11  6 - 29 U/L Final   Cholesterol 07/05/2023 89  <200 mg/dL Final   HDL 25/95/6387 37 (L)  > OR = 50 mg/dL Final   Triglycerides 56/43/3295 83  <150 mg/dL Final   LDL Cholesterol (Calc) 07/05/2023 35  mg/dL (calc) Final  Comment: Reference range: <100 . Desirable range <100 mg/dL for primary prevention;   <70 mg/dL for patients with CHD or diabetic patients  with > or = 2 CHD risk factors. Marland Kitchen LDL-C is now calculated using the Martin-Hopkins  calculation, which is a validated novel method providing  better accuracy than the Friedewald equation in the  estimation of LDL-C.  Horald Pollen et al. Lenox Ahr. 6213;086(57): 2061-2068  (http://education.[facility].com/faq/FAQ164)    Total CHOL/HDL Ratio 07/05/2023 2.4  <8.4 (calc) Final   Non-HDL Cholesterol (Calc) 07/05/2023 52  <130 mg/dL (calc) Final   Comment: For patients with diabetes plus 1 major ASCVD risk  factor, treating to a non-HDL-C goal of <100 mg/dL  (LDL-C of <69 mg/dL) is considered a therapeutic  option.    T3 Uptake 07/05/2023 29  22 - 35 % Final   T4, Total 07/05/2023 9.5  5.1 - 11.9 mcg/dL Final   Free Thyroxine Index 07/05/2023 2.8  1.4 - 3.8 Final   TSH 07/05/2023 1.11  0.40 - 4.50 mIU/L Final   Hgb A1c MFr Bld 07/05/2023 5.9 (H)  <5.7 % of total Hgb Final   Comment: For someone without known diabetes, a hemoglobin  A1c  value between 5.7% and 6.4% is consistent with prediabetes and should be confirmed with a  follow-up test. . For someone with known diabetes, a value <7% indicates that their diabetes is well controlled. A1c targets should be individualized based on duration of diabetes, age, comorbid conditions, and other considerations. . This assay result is consistent with an increased risk of diabetes. . Currently, no consensus exists regarding use of hemoglobin A1c for diagnosis of diabetes for children. .    Mean Plasma Glucose 07/05/2023 123  mg/dL Final   eAG (mmol/L) 62/95/2841 6.8  mmol/L Final   Comment: . This test was performed on the Roche cobas c503 platform. Effective 07/31/22, a change in test platforms from the Abbott Architect to the Roche cobas c503 may have shifted HbA1c results compared to historical results. Based on laboratory validation testing conducted at [facility], the Roche platform relative to the Abbott platform had an average increase in HbA1c value of < or = 0.3%. This difference is within accepted  variability established by the [facility]. Note that not all individuals will have had a shift in their results and direct comparisons between historical and current results for testing conducted on different platforms is not recommended.    Creatinine, Urine 07/05/2023 157  20 - 275 mg/dL Final   Microalb, Ur 32/44/0102 5.3  mg/dL Final   Comment: Reference Range Not established    Microalb Creat Ratio 07/05/2023 34 (H)  <30 mg/g creat Final   Comment: . The ADA defines abnormalities in albumin excretion as follows: Marland Kitchen Albuminuria Category        Result (mg/g creatinine) . Normal to Mildly increased   <30 Moderately increased         30-299  Severely increased           > OR = 300 . The ADA recommends that at least two of three specimens collected within a 3-6 month period be abnormal before considering a patient to  be within a diagnostic category.     Past Medical History:  Diagnosis Date   Abnormal finding on EKG 10/29/2012   FOLLOW UP NUCLEAR STRESS TEST ON 11/05/12 -NORMAL   Corneal erosion 2012   RIGHT EYE--HAS RESOLVED--NO PROBLEMS SINCE   Diabetes mellitus    Diverticulitis  Fatigue    GERD (gastroesophageal reflux disease)    PAST HX--NO PROBLEMS NOW-PT DOES TAKE DAILY TUMS AT BEDTIME   Goiter    cyst vs goiter on thyroid   H/O hiatal hernia    High cholesterol    Hypertension    Obesity    Osteopenia    Past Surgical History:  Procedure Laterality Date   ABDOMINAL HYSTERECTOMY     BREAST LUMPECTOMY     BENIGN   CARDIOVASCULAR STRESS TEST  06/08/2009   EF 78%, NO ISCHEMIA   THYROID LOBECTOMY  11/27/2012   Procedure: THYROID LOBECTOMY;  Surgeon: Axel Filler, MD;  Location: [facility];  Service: General;  Laterality: Right;  Right Thyroid Lobectomy   TONSILLECTOMY     US ECHOCARDIOGRAPHY  03/23/2006   EF 55-60%   Current Outpatient Medications on File Prior to Visit  Medication Sig Dispense Refill   Accu-Chek Softclix Lancets lancets USE AS DIRECTED TO CHECK SUGAR 100 each 3   allopurinol (ZYLOPRIM) 100 MG tablet TAKE 2 TABLETS BY MOUTH EVERY DAY 120 tablet 1   amLODipine (NORVASC) 10 MG tablet Take 1 tablet (10 mg total) by mouth daily. 90 tablet 3   Blood Glucose Monitoring Suppl (ACCU-CHEK AVIVA PLUS) W/DEVICE KIT PT NEEDS METER-STRIPS(1 BOTTLE =100/5 REFILLS)-LANCETS(1 BOX/5 REFILLS) CHECKS BS QD DX: 250.00 1 kit 0   furosemide (LASIX) 20 MG tablet TAKE 1 TABLET BY MOUTH EVERY DAY 90 tablet 0   glucose blood (ACCU-CHEK AVIVA PLUS) test strip CHECK BLOOD SUGAR TWICE DAILY FASTING IN MORNING AND THEN 2 HOURS AFTER A MEAL E11.65O 100 strip 3   levothyroxine (SYNTHROID) 88 MCG tablet Take 1 tablet (88 mcg total) by mouth daily. 90 tablet 3   lisinopril (ZESTRIL) 20 MG tablet Take 1 tablet (20 mg total) by mouth 2 (two) times daily. 180 tablet 3   Multiple Vitamin (MULTIVITAMIN WITH  MINERALS) TABS Take 1 tablet by mouth daily.     nebivolol (BYSTOLIC) 5 MG tablet Take 1 tablet (5 mg total) by mouth daily. 90 tablet 3   rosuvastatin (CRESTOR) 20 MG tablet Take 1 tablet (20 mg total) by mouth daily. (Patient taking differently: Take 10 mg by mouth daily.) 90 tablet 3   Semaglutide, 1 MG/DOSE, 4 MG/3ML SOPN Inject 1 mg as directed once a week. 9 mL 3   vitamin E 180 MG (400 UNITS) capsule Take 400 Units by mouth daily.     amoxicillin-clavulanate (AUGMENTIN) 875-125 MG tablet Take 1 tablet by mouth 2 (two) times daily. (Patient not taking: Reported on 07/09/2023) 20 tablet 0   Calcium Carbonate (CALCIUM 500 PO) Take by mouth. (Patient not taking: Reported on 02/26/2023)     Cholecalciferol (VITAMIN D-3) 125 MCG (5000 UT) TABS Take by mouth. (Patient not taking: Reported on 02/26/2023)     vitamin B-12 (CYANOCOBALAMIN) 1000 MCG tablet Take 1,000 mcg by mouth daily. (Patient not taking: Reported on 02/26/2023)     No current facility-administered medications on file prior to visit.   Allergies  Allergen Reactions   Clindamycin/Lincomycin     CLINDAMYCIN TAKEN WITH CIPRO CAUSED SEVERE NAUSEA--PT STATES SHE CAN TAKE CIPRO ALONE WITHOUT PROBLEM--IT WAS JUST THE COMBINATION SHE DID NOT TOLERATE.   Levaquin [Levofloxacin] Nausea And Vomiting   Metronidazole Hcl Other (See Comments)    Deathly sick WHEN PT TOOK METRONIDAZOLE WITH CIPRO.  PT STATES SHE HAS TAKEN CIPRO ALONE WITHOUT PROBLEM --JUST DID NOT TOLERATE THE COMBINATION OF BOTH DRUGS TAKEN TOGETHER.   Social History  Socioeconomic History   Marital status: Married    Spouse name: Dorene Sorrow   Number of children: 2   Years of education: Not on file   Highest education level: Not on file  Occupational History   Not on file  Tobacco Use   Smoking status: Never   Smokeless tobacco: Never  Substance and Sexual Activity   Alcohol use: No   Drug use: No   Sexual activity: Not Currently  Other Topics Concern   Not on file   Social History Narrative   2 sons   Hayes Ludwig and great grandchildren   Social Determinants of Health   Financial Resource Strain: Low Risk  (01/18/2023)   Overall Financial Resource Strain (CARDIA)    Difficulty of Paying Living Expenses: Not hard at all  Food Insecurity: No Food Insecurity (01/18/2023)   Hunger Vital Sign    Worried About Running Out of Food in the Last Year: Never true    Ran Out of Food in the Last Year: Never true  Transportation Needs: No Transportation Needs (01/18/2023)   PRAPARE - Administrator, Civil Service (Medical): No    Lack of Transportation (Non-Medical): No  Physical Activity: Sufficiently Active (01/18/2023)   Exercise Vital Sign    Days of Exercise per Week: 5 days    Minutes of Exercise per Session: 30 min  Stress: No Stress Concern Present (01/18/2023)   [employer] of Occupational Health - Occupational Stress Questionnaire    Feeling of Stress : Not at all  Social Connections: Moderately Integrated (01/18/2023)   Social Connection and Isolation Panel [NHANES]    Frequency of Communication with Friends and Family: More than three times a week    Frequency of Social Gatherings with Friends and Family: More than three times a week    Attends Religious Services: 1 to 4 times per year    Active Member of [employer] or Organizations: No    Attends Banker Meetings: Never    Marital Status: Married  Catering manager Violence: Not At Risk (01/18/2023)   Humiliation, Afraid, Rape, and Kick questionnaire    Fear of Current or Ex-Partner: No    Emotionally Abused: No    Physically Abused: No    Sexually Abused: No   No family history on file.     Review of Systems  All other systems reviewed and are negative.      Objective:   Physical Exam Vitals reviewed.  Constitutional:      General: She is not in acute distress.    Appearance: Normal appearance. She is well-developed and normal weight. She is not  ill-appearing, toxic-appearing or diaphoretic.  HENT:     Right Ear: Tympanic membrane, ear canal and external ear normal.     Left Ear: Tympanic membrane, ear canal and external ear normal.     Nose: Nose normal.     Mouth/Throat:     Mouth: Mucous membranes are moist.     Pharynx: Oropharynx is clear. No oropharyngeal exudate or posterior oropharyngeal erythema.  Eyes:     Extraocular Movements: Extraocular movements intact.     Conjunctiva/sclera: Conjunctivae normal.     Pupils: Pupils are equal, round, and reactive to light.  Neck:     Thyroid: No thyromegaly.     Vascular: No carotid bruit or JVD.  Cardiovascular:     Rate and Rhythm: Normal rate and regular rhythm.     Pulses: Normal pulses.     Heart sounds:  Normal heart sounds. No murmur heard.    No friction rub. No gallop.  Pulmonary:     Effort: Pulmonary effort is normal. No respiratory distress.     Breath sounds: Normal breath sounds. No stridor. No wheezing, rhonchi or rales.  Chest:     Chest wall: No tenderness.  Abdominal:     General: Bowel sounds are normal. There is no distension.     Palpations: Abdomen is soft. There is no mass.     Tenderness: There is no abdominal tenderness. There is no guarding or rebound.  Musculoskeletal:     Cervical back: Normal range of motion and neck supple. No rigidity.     Right lower leg: No edema.     Left lower leg: No edema.  Lymphadenopathy:     Cervical: No cervical adenopathy.  Skin:    Coloration: Skin is not jaundiced or pale.     Findings: No bruising, erythema, lesion or rash.  Neurological:     General: No focal deficit present.     Mental Status: She is alert and oriented to person, place, and time. Mental status is at baseline.     Cranial Nerves: No cranial nerve deficit.     Motor: No weakness or abnormal muscle tone.     Coordination: Coordination normal.     Gait: Gait normal.     Deep Tendon Reflexes: Reflexes normal.  Psychiatric:        Mood and  Affect: Mood normal.        Behavior: Behavior normal.        Thought Content: Thought content normal.        Judgment: Judgment normal.           Assessment & Plan:  Controlled type 2 diabetes mellitus with diabetic nephropathy, without long-term current use of insulin (HCC)  Essential hypertension  Pure hypercholesterolemia  Encounter for Medicare annual wellness exam  Other specified hypothyroidism Patient's blood pressure is outstanding.  Recommended discontinuation of metformin as her A1c is outstanding at 5.9.  Recommended she decrease rosuvastatin to 10 mg a day due to her excellent cholesterol panel.  Recommended a flu shot, shingles vaccine, and a COVID booster.  The patient denies any falls or memory loss or depression.  Bone density and mammogram are up-to-date

## 2023-07-30 ENCOUNTER — Telehealth: Payer: Self-pay

## 2023-07-30 NOTE — Telephone Encounter (Signed)
Pt called in concerned about her bp's running high. Pt called in wanting to ask if pcp would want to up the dosage of one or more of her meds. Pt would like to speak with nurse as well. Please advise.  Cb#: [phone]

## 2023-07-31 ENCOUNTER — Ambulatory Visit: Payer: Medicare Other | Admitting: Family Medicine

## 2023-07-31 VITALS — BP 156/88 | HR 84 | Ht 63.0 in | Wt 181.6 lb

## 2023-07-31 DIAGNOSIS — I10 Essential (primary) hypertension: Secondary | ICD-10-CM

## 2023-07-31 DIAGNOSIS — E7800 Pure hypercholesterolemia, unspecified: Secondary | ICD-10-CM | POA: Diagnosis not present

## 2023-07-31 MED ORDER — ROSUVASTATIN CALCIUM 10 MG PO TABS
10.0000 mg | ORAL_TABLET | Freq: Every day | ORAL | 1 refills | Status: DC
Start: 2023-07-31 — End: 2023-12-07

## 2023-07-31 NOTE — Progress Notes (Signed)
Subjective:    Patient ID: Whitney Morales, female    DOB: [birth year], 82 y.o.   MRN: [id number] Wt Readings from Last 3 Encounters:  07/31/23 181 lb 9.6 oz (82.4 kg)  07/09/23 182 lb 9.6 oz (82.8 kg)  02/26/23 181 lb 12.8 oz (82.5 kg)   Patient was seen less than a month ago.  At that, her blood pressure was excellent.  We reduced her rosuvastatin by 50% and discontinued metformin.  Since that visit, her blood pressure has been in the 150s to 160s over 80s.  She denies any chest pain shortness of breath or dyspnea on exertion.  Nothing else has changed.  She is not eating more sodium.  However she is checking her blood pressure excessively   Past Medical History:  Diagnosis Date   Abnormal finding on EKG 10/29/2012   FOLLOW UP NUCLEAR STRESS TEST ON 11/05/12 -NORMAL   Corneal erosion 2012   RIGHT EYE--HAS RESOLVED--NO PROBLEMS SINCE   Diabetes mellitus    Diverticulitis    Fatigue    GERD (gastroesophageal reflux disease)    PAST HX--NO PROBLEMS NOW-PT DOES TAKE DAILY TUMS AT BEDTIME   Goiter    cyst vs goiter on thyroid   H/O hiatal hernia    High cholesterol    Hypertension    Obesity    Osteopenia    Past Surgical History:  Procedure Laterality Date   ABDOMINAL HYSTERECTOMY     BREAST LUMPECTOMY     BENIGN   CARDIOVASCULAR STRESS TEST  06/08/2009   EF 78%, NO ISCHEMIA   THYROID LOBECTOMY  11/27/2012   Procedure: THYROID LOBECTOMY;  Surgeon: Axel Filler, MD;  Location: [facility];  Service: General;  Laterality: Right;  Right Thyroid Lobectomy   TONSILLECTOMY     US ECHOCARDIOGRAPHY  03/23/2006   EF 55-60%   Current Outpatient Medications on File Prior to Visit  Medication Sig Dispense Refill   Accu-Chek Softclix Lancets lancets USE AS DIRECTED TO CHECK SUGAR 100 each 3   allopurinol (ZYLOPRIM) 100 MG tablet TAKE 2 TABLETS BY MOUTH EVERY DAY 120 tablet 1   amLODipine (NORVASC) 10 MG tablet Take 1 tablet (10 mg total) by mouth daily. 90 tablet 3   amoxicillin-clavulanate  (AUGMENTIN) 875-125 MG tablet Take 1 tablet by mouth 2 (two) times daily. (Patient not taking: Reported on 07/09/2023) 20 tablet 0   Blood Glucose Monitoring Suppl (ACCU-CHEK AVIVA PLUS) W/DEVICE KIT PT NEEDS METER-STRIPS(1 BOTTLE =100/5 REFILLS)-LANCETS(1 BOX/5 REFILLS) CHECKS BS QD DX: 250.00 1 kit 0   Calcium Carbonate (CALCIUM 500 PO) Take by mouth. (Patient not taking: Reported on 02/26/2023)     Cholecalciferol (VITAMIN D-3) 125 MCG (5000 UT) TABS Take by mouth. (Patient not taking: Reported on 02/26/2023)     furosemide (LASIX) 20 MG tablet TAKE 1 TABLET BY MOUTH EVERY DAY 90 tablet 0   glucose blood (ACCU-CHEK AVIVA PLUS) test strip CHECK BLOOD SUGAR TWICE DAILY FASTING IN MORNING AND THEN 2 HOURS AFTER A MEAL E11.65O 100 strip 3   levothyroxine (SYNTHROID) 88 MCG tablet Take 1 tablet (88 mcg total) by mouth daily. 90 tablet 3   lisinopril (ZESTRIL) 20 MG tablet Take 1 tablet (20 mg total) by mouth 2 (two) times daily. 180 tablet 3   Multiple Vitamin (MULTIVITAMIN WITH MINERALS) TABS Take 1 tablet by mouth daily.     nebivolol (BYSTOLIC) 5 MG tablet Take 1 tablet (5 mg total) by mouth daily. 90 tablet 3   rosuvastatin (CRESTOR) 20 MG  tablet Take 1 tablet (20 mg total) by mouth daily. (Patient taking differently: Take 10 mg by mouth daily.) 90 tablet 3   Semaglutide, 1 MG/DOSE, 4 MG/3ML SOPN Inject 1 mg as directed once a week. 9 mL 3   vitamin B-12 (CYANOCOBALAMIN) 1000 MCG tablet Take 1,000 mcg by mouth daily. (Patient not taking: Reported on 02/26/2023)     vitamin E 180 MG (400 UNITS) capsule Take 400 Units by mouth daily.     No current facility-administered medications on file prior to visit.   Allergies  Allergen Reactions   Clindamycin/Lincomycin     CLINDAMYCIN TAKEN WITH CIPRO CAUSED SEVERE NAUSEA--PT STATES SHE CAN TAKE CIPRO ALONE WITHOUT PROBLEM--IT WAS JUST THE COMBINATION SHE DID NOT TOLERATE.   Levaquin [Levofloxacin] Nausea And Vomiting   Metronidazole Hcl Other (See Comments)     Deathly sick WHEN PT TOOK METRONIDAZOLE WITH CIPRO.  PT STATES SHE HAS TAKEN CIPRO ALONE WITHOUT PROBLEM --JUST DID NOT TOLERATE THE COMBINATION OF BOTH DRUGS TAKEN TOGETHER.   Social History   Socioeconomic History   Marital status: Married    Spouse name: Dorene Sorrow   Number of children: 2   Years of education: Not on file   Highest education level: Not on file  Occupational History   Not on file  Tobacco Use   Smoking status: Never   Smokeless tobacco: Never  Substance and Sexual Activity   Alcohol use: No   Drug use: No   Sexual activity: Not Currently  Other Topics Concern   Not on file  Social History Narrative   2 sons   Hayes Ludwig and great grandchildren   Social Determinants of Health   Financial Resource Strain: Low Risk  (01/18/2023)   Overall Financial Resource Strain (CARDIA)    Difficulty of Paying Living Expenses: Not hard at all  Food Insecurity: No Food Insecurity (01/18/2023)   Hunger Vital Sign    Worried About Running Out of Food in the Last Year: Never true    Ran Out of Food in the Last Year: Never true  Transportation Needs: No Transportation Needs (01/18/2023)   PRAPARE - Administrator, Civil Service (Medical): No    Lack of Transportation (Non-Medical): No  Physical Activity: Sufficiently Active (01/18/2023)   Exercise Vital Sign    Days of Exercise per Week: 5 days    Minutes of Exercise per Session: 30 min  Stress: No Stress Concern Present (01/18/2023)   [employer] of Occupational Health - Occupational Stress Questionnaire    Feeling of Stress : Not at all  Social Connections: Moderately Integrated (01/18/2023)   Social Connection and Isolation Panel [NHANES]    Frequency of Communication with Friends and Family: More than three times a week    Frequency of Social Gatherings with Friends and Family: More than three times a week    Attends Religious Services: 1 to 4 times per year    Active Member of [employer] or Organizations:  No    Attends Banker Meetings: Never    Marital Status: Married  Catering manager Violence: Not At Risk (01/18/2023)   Humiliation, Afraid, Rape, and Kick questionnaire    Fear of Current or Ex-Partner: No    Emotionally Abused: No    Physically Abused: No    Sexually Abused: No   No family history on file.     Review of Systems  All other systems reviewed and are negative.      Objective:   Physical  Exam Vitals reviewed.  Constitutional:      General: She is not in acute distress.    Appearance: She is well-developed. She is not diaphoretic.  HENT:     Right Ear: External ear normal.     Left Ear: External ear normal.     Nose: Nose normal.  Neck:     Thyroid: No thyromegaly.     Vascular: No JVD.  Cardiovascular:     Rate and Rhythm: Normal rate and regular rhythm.     Heart sounds: Normal heart sounds. No murmur heard. Pulmonary:     Effort: Pulmonary effort is normal. No respiratory distress.     Breath sounds: Normal breath sounds. No wheezing or rales.  Abdominal:     General: Bowel sounds are normal. There is no distension.     Palpations: Abdomen is soft. There is no mass.     Tenderness: There is no abdominal tenderness. There is no guarding or rebound.  Musculoskeletal:     Cervical back: Neck supple.     Right lower leg: No edema.     Left lower leg: No edema.  Skin:    Findings: No erythema or rash.  Neurological:     Mental Status: She is alert and oriented to person, place, and time.     Cranial Nerves: No cranial nerve deficit.     Motor: No abnormal muscle tone.     Coordination: Coordination normal.          Assessment & Plan:  Pure hypercholesterolemia - Plan: rosuvastatin (CRESTOR) 10 MG tablet  Essential hypertension Blood pressure is elevated.  Increase nebivolol to 10 mg a day and recheck blood pressure in 1 week.  Encouraged the patient to not excessively check her blood pressure as I feel anxiety may be artificially  elevating her blood pressure.  Reduce sodium consumption.

## 2023-08-06 ENCOUNTER — Ambulatory Visit: Payer: Medicare Other

## 2023-08-06 DIAGNOSIS — Z719 Counseling, unspecified: Secondary | ICD-10-CM

## 2023-08-06 DIAGNOSIS — Z23 Encounter for immunization: Secondary | ICD-10-CM

## 2023-08-06 NOTE — Progress Notes (Signed)
In nurse clinic for immunizations. Patient requesting Covid Comirnaty 12+. Voices no concerns. VIS reviewed and given to patient. Vaccine tolerated well. Patient monitored for 15 minutes; no problems. [facility]IR updated and copy given to patient.  Abagail Kitchens, RN

## 2023-08-10 ENCOUNTER — Telehealth: Payer: Self-pay

## 2023-08-10 NOTE — Telephone Encounter (Signed)
Pt called in stating that her bp was 171/94 before taking meds this morning. Pt would like to know if she needs to be seen by pcp to change meds. Pt would like a cb from nurse please.   Cb#: [phone]

## 2023-08-16 ENCOUNTER — Telehealth: Payer: Self-pay | Admitting: Family Medicine

## 2023-08-16 ENCOUNTER — Other Ambulatory Visit: Payer: Self-pay

## 2023-08-16 DIAGNOSIS — I10 Essential (primary) hypertension: Secondary | ICD-10-CM

## 2023-08-16 MED ORDER — NEBIVOLOL HCL 20 MG PO TABS: 20.0000 mg | ORAL_TABLET | Freq: Every day | ORAL | 3 refills | Status: DC

## 2023-08-16 NOTE — Telephone Encounter (Signed)
Prescription Request  08/16/2023  LOV: 07/31/2023  What is the name of the medication or equipment?   nebivolol (BYSTOLIC) TAB (patient stated she's currently taking 20MG ) **Patient dropped off log of recent b/p readings; placed on nurse's desk.   Have you contacted your pharmacy to request a refill? Yes   Which pharmacy would you like this sent to?  [id number] - [place], [place] - [address] AT [facility] [address] [place] [address] Phone: [phone] Fax: [phone]    Patient notified that their request is being sent to the clinical staff for review and that they should receive a response within 2 business days.   Please advise patient at [phone]

## 2023-08-20 LAB — HM DIABETES EYE EXAM

## 2023-09-03 ENCOUNTER — Other Ambulatory Visit: Payer: Self-pay

## 2023-09-03 ENCOUNTER — Other Ambulatory Visit: Payer: Self-pay | Admitting: Family Medicine

## 2023-09-03 DIAGNOSIS — I10 Essential (primary) hypertension: Secondary | ICD-10-CM

## 2023-09-03 DIAGNOSIS — N1831 Chronic kidney disease, stage 3a: Secondary | ICD-10-CM

## 2023-09-03 NOTE — Telephone Encounter (Signed)
Prescription Request  09/03/2023  LOV: 07/31/23  What is the name of the medication or equipment? furosemide (LASIX) 20 MG tablet [[id number]]  Have you contacted your pharmacy to request a refill? Yes   Which pharmacy would you like this sent to?  [id number] - [place], [place] - [address] AT [facility] [address] [place] [address] Phone: [phone] Fax: [phone]    Patient notified that their request is being sent to the clinical staff for review and that they should receive a response within 2 business days.   Please advise at [facility] [phone]

## 2023-09-04 NOTE — Telephone Encounter (Signed)
Requested medications are due for refill today.  yes  Requested medications are on the active medications list.  yes  Last refill. 06/06/2023 #90  0 rf  Future visit scheduled.   no  Notes to clinic.  Missing labs.    Requested Prescriptions  Pending Prescriptions Disp Refills   furosemide (LASIX) 20 MG tablet 90 tablet 0    Sig: Take 1 tablet (20 mg total) by mouth daily.     Cardiovascular:  Diuretics - Loop Failed - 09/03/2023 11:22 AM      Failed - Cr in normal range and within 180 days    Creat  Date Value Ref Range Status  07/05/2023 1.03 (H) 0.60 - 0.95 mg/dL Final   Creatinine, Urine  Date Value Ref Range Status  07/05/2023 157 20 - 275 mg/dL Final         Failed - Mg Level in normal range and within 180 days    No results found for: "MG"       Failed - Last BP in normal range    BP Readings from Last 1 Encounters:  07/31/23 (!) 156/88         Failed - Valid encounter within last 6 months    Recent Outpatient Visits           1 year ago Essential hypertension   [facility] Tanya Nones, Priscille Heidelberg, MD   1 year ago Essential hypertension   [facility] Pickard, Priscille Heidelberg, MD   1 year ago Essential hypertension   [facility] Pickard, Priscille Heidelberg, MD   1 year ago Essential hypertension   [facility] Donita Brooks, MD   1 year ago Essential hypertension   [facility] Tanya Nones, Priscille Heidelberg, MD              Passed - K in normal range and within 180 days    Potassium  Date Value Ref Range Status  07/05/2023 4.6 3.5 - 5.3 mmol/L Final         Passed - Ca in normal range and within 180 days    Calcium  Date Value Ref Range Status  07/05/2023 9.6 8.6 - 10.4 mg/dL Final         Passed - Na in normal range and within 180 days    Sodium  Date Value Ref Range Status  07/05/2023 143 135 - 146 mmol/L Final         Passed - Cl in normal range and within 180 days    Chloride   Date Value Ref Range Status  07/05/2023 107 98 - 110 mmol/L Final

## 2023-09-06 ENCOUNTER — Telehealth: Payer: Self-pay | Admitting: Family Medicine

## 2023-09-06 ENCOUNTER — Other Ambulatory Visit: Payer: Self-pay

## 2023-09-06 DIAGNOSIS — N1831 Chronic kidney disease, stage 3a: Secondary | ICD-10-CM

## 2023-09-06 MED ORDER — ALLOPURINOL 100 MG PO TABS: 200.0000 mg | ORAL_TABLET | Freq: Every day | ORAL | 1 refills | Status: DC

## 2023-09-06 NOTE — Telephone Encounter (Signed)
Prescription Request  09/06/2023  LOV: 07/31/2023  What is the name of the medication or equipment? Allopurinol 100 mg tablet  Have you contacted your pharmacy to request a refill? Yes   Which pharmacy would you like this sent to?  [id number] - [place], [place] - [address] AT [facility] [address] [place] [address] Phone: [phone] Fax: [phone]    Patient notified that their request is being sent to the clinical staff for review and that they should receive a response within 2 business days.   Please advise at [facility] [phone]

## 2023-09-06 NOTE — Telephone Encounter (Signed)
Prescription Request  09/06/2023  LOV: 07/31/2023  What is the name of the medication or equipment? Ozempic 4 mg/3 ml  Have you contacted your pharmacy to request a refill? Yes   Which pharmacy would you like this sent to?  [id number] - [place], [place] - [address] AT [facility] [address] [place] [address] Phone: [phone] Fax: [phone]    Patient notified that their request is being sent to the clinical staff for review and that they should receive a response within 2 business days.   Please advise at [facility] [phone]

## 2023-09-07 ENCOUNTER — Telehealth: Payer: Self-pay

## 2023-09-07 NOTE — Telephone Encounter (Signed)
Called pt to get more clarification on Ozempic rx   Copied from CRM [id number]. Topic: Clinical - Medication Refill >> Sep 07, 2023 10:35 AM Prudencio Pair wrote: Most Recent Primary Care Visit:  Provider: Lynnea Ferrier T  Department: [facility]  Visit Type: OFFICE VISIT  Date: 07/31/2023  Medication: Ozempic  Has the patient contacted their pharmacy? Yes, called twice and can't get anyone.  (Agent: If no, request that the patient contact the pharmacy for the refill. If patient does not wish to contact the pharmacy document the reason why and proceed with request.) (Agent: If yes, when and what did the pharmacy advise?)  Is this the correct pharmacy for this prescription? Yes If no, delete pharmacy and type the correct one.  This is the patient's preferred pharmacy:  [id number] - [place], [place] - [address] AT [facility] [address] [place] [address] Phone: [phone] Fax: [phone]   Has the prescription been filled recently? Yes  Is the patient out of the medication? Yes  Has the patient been seen for an appointment in the last year OR does the patient have an upcoming appointment? Yes  Can we respond through MyChart? Yes  Agent: Please be advised that Rx refills may take up to 3 business days. We ask that you follow-up with your pharmacy.

## 2023-09-07 NOTE — Telephone Encounter (Signed)
Copied from CRM [id number]. Topic: Clinical - Prescription Issue >> Sep 07, 2023  2:39 PM Whitney Morales wrote: Reason for CRM: Pt std she went to pharmacy to get prescription, pharmacy  std they didn't receive an pre-auth for medication Ozempic

## 2023-09-08 ENCOUNTER — Encounter: Payer: Self-pay | Admitting: Family Medicine

## 2023-09-10 ENCOUNTER — Telehealth: Payer: Self-pay

## 2023-09-10 ENCOUNTER — Other Ambulatory Visit: Payer: Self-pay

## 2023-09-10 DIAGNOSIS — E1121 Type 2 diabetes mellitus with diabetic nephropathy: Secondary | ICD-10-CM

## 2023-09-10 MED ORDER — SEMAGLUTIDE (1 MG/DOSE) 4 MG/3ML ~~LOC~~ SOPN: 1.0000 mg | PEN_INJECTOR | SUBCUTANEOUS | 3 refills | Status: DC

## 2023-09-10 NOTE — Telephone Encounter (Signed)
Copied from CRM [id number]. Topic: Clinical - Prescription Issue >> Sep 10, 2023  9:08 AM Whitney Morales wrote: Reason for CRM: Pt stated she would like a call back regarding her ozempic, says she has called her pharmacy and they are saying they have not received authorization from Dr. She says she needs her shot today and will call back at lunch if she doesn't hear from anyone

## 2023-09-11 ENCOUNTER — Telehealth: Payer: Self-pay | Admitting: Family Medicine

## 2023-09-11 NOTE — Telephone Encounter (Signed)
Patient dropped off blood pressure log dating 08/17/23 thru 09/10/2023.  Log placed on nurse's desk.

## 2023-09-12 ENCOUNTER — Other Ambulatory Visit: Payer: Self-pay

## 2023-09-12 ENCOUNTER — Telehealth: Payer: Self-pay | Admitting: Family Medicine

## 2023-09-12 DIAGNOSIS — N1831 Chronic kidney disease, stage 3a: Secondary | ICD-10-CM

## 2023-09-12 MED ORDER — FUROSEMIDE 20 MG PO TABS: 20.0000 mg | ORAL_TABLET | Freq: Every day | ORAL | 0 refills | Status: DC

## 2023-09-12 NOTE — Telephone Encounter (Signed)
Prescription Request  09/12/2023  LOV: 07/31/2023  What is the name of the medication or equipment?   furosemide (LASIX) 20 MG tablet  **90 day script requested**  Have you contacted your pharmacy to request a refill? Yes   Which pharmacy would you like this sent to?  [id number] - [place], [place] - [address] AT [facility] [address] [place] [address] Phone: [phone] Fax: [phone]    Patient notified that their request is being sent to the clinical staff for review and that they should receive a response within 2 business days.   Please advise pharmacist.

## 2023-09-26 ENCOUNTER — Other Ambulatory Visit: Payer: Self-pay | Admitting: Family Medicine

## 2023-09-26 DIAGNOSIS — I10 Essential (primary) hypertension: Secondary | ICD-10-CM

## 2023-09-27 NOTE — Telephone Encounter (Signed)
Requested Prescriptions  Refused Prescriptions Disp Refills   nebivolol (BYSTOLIC) 5 MG tablet [Pharmacy Med Name: NEBIVOLOL 5 MG TABLET] 90 tablet 3    Sig: TAKE 1 TABLET (5 MG TOTAL) BY MOUTH DAILY.     Cardiovascular: Beta Blockers 3 Failed - 09/26/2023  2:50 AM      Failed - Cr in normal range and within 360 days    Creat  Date Value Ref Range Status  07/05/2023 1.03 (H) 0.60 - 0.95 mg/dL Final   Creatinine, Urine  Date Value Ref Range Status  07/05/2023 157 20 - 275 mg/dL Final         Failed - Last BP in normal range    BP Readings from Last 1 Encounters:  07/31/23 (!) 156/88         Failed - Valid encounter within last 6 months    Recent Outpatient Visits           1 year ago Essential hypertension   [facility] Tanya Nones, Priscille Heidelberg, MD   1 year ago Essential hypertension   [facility] Pickard, Priscille Heidelberg, MD   1 year ago Essential hypertension   [facility] Pickard, Priscille Heidelberg, MD   1 year ago Essential hypertension   [facility] Pickard, Priscille Heidelberg, MD   1 year ago Essential hypertension   [facility] Pickard, Priscille Heidelberg, MD              Passed - AST in normal range and within 360 days    AST  Date Value Ref Range Status  07/05/2023 14 10 - 35 U/L Final         Passed - ALT in normal range and within 360 days    ALT  Date Value Ref Range Status  07/05/2023 11 6 - 29 U/L Final         Passed - Last Heart Rate in normal range    Pulse Readings from Last 1 Encounters:  07/31/23 84

## 2023-10-22 ENCOUNTER — Other Ambulatory Visit: Payer: Self-pay | Admitting: Family Medicine

## 2023-10-22 DIAGNOSIS — I10 Essential (primary) hypertension: Secondary | ICD-10-CM

## 2023-11-08 ENCOUNTER — Telehealth: Payer: Self-pay | Admitting: Family Medicine

## 2023-11-08 ENCOUNTER — Other Ambulatory Visit: Payer: Self-pay

## 2023-11-08 DIAGNOSIS — I10 Essential (primary) hypertension: Secondary | ICD-10-CM

## 2023-11-08 MED ORDER — LISINOPRIL 20 MG PO TABS: 20.0000 mg | ORAL_TABLET | Freq: Two times a day (BID) | ORAL | 1 refills | Status: DC

## 2023-11-08 NOTE — Telephone Encounter (Signed)
Patient recently received refill of lisinopril (ZESTRIL) 20 MG tablet (SIG: take 1 tablet by mouth twice a day) Requesting for provider to update it to a 90 day supply.  Pharmacy:   [id number] [place], [place] - [address] AT [facility] [address] [place] [address] Phone: [phone]  Fax: [phone] DEA #: [id number]   Please advise pharmacist.

## 2023-11-23 ENCOUNTER — Other Ambulatory Visit: Payer: Self-pay

## 2023-11-23 ENCOUNTER — Telehealth: Payer: Self-pay

## 2023-11-23 DIAGNOSIS — N1831 Chronic kidney disease, stage 3a: Secondary | ICD-10-CM

## 2023-11-23 DIAGNOSIS — I10 Essential (primary) hypertension: Secondary | ICD-10-CM

## 2023-11-23 DIAGNOSIS — E1121 Type 2 diabetes mellitus with diabetic nephropathy: Secondary | ICD-10-CM

## 2023-11-23 DIAGNOSIS — E039 Hypothyroidism, unspecified: Secondary | ICD-10-CM

## 2023-11-23 DIAGNOSIS — E785 Hyperlipidemia, unspecified: Secondary | ICD-10-CM

## 2023-11-23 NOTE — Telephone Encounter (Signed)
Copied from CRM [id number]. Topic: Appointments - Scheduling Inquiry for Clinic >> Nov 23, 2023 10:31 AM Gaetano Hawthorne wrote: Reason for CRM: Patient has schedule a follow up with Dr. Tanya Nones on Feb 14th - she would like to come in a week or so before to get her labs done - there were no future lab orders in her chart - Can you follow up with Dr. Tanya Nones and nurses to get her lab visit scheduled once the orders are in?

## 2023-11-26 ENCOUNTER — Other Ambulatory Visit: Payer: Self-pay | Admitting: Family Medicine

## 2023-11-26 DIAGNOSIS — N1831 Chronic kidney disease, stage 3a: Secondary | ICD-10-CM

## 2023-11-28 ENCOUNTER — Other Ambulatory Visit: Payer: Medicare Other

## 2023-11-28 DIAGNOSIS — E1121 Type 2 diabetes mellitus with diabetic nephropathy: Secondary | ICD-10-CM

## 2023-11-28 DIAGNOSIS — I10 Essential (primary) hypertension: Secondary | ICD-10-CM

## 2023-11-28 DIAGNOSIS — N1831 Chronic kidney disease, stage 3a: Secondary | ICD-10-CM

## 2023-11-28 DIAGNOSIS — E785 Hyperlipidemia, unspecified: Secondary | ICD-10-CM

## 2023-11-28 DIAGNOSIS — E039 Hypothyroidism, unspecified: Secondary | ICD-10-CM

## 2023-11-29 LAB — CBC WITH DIFFERENTIAL/PLATELET
Absolute Lymphocytes: 2610 {cells}/uL (ref 850–3900)
Absolute Monocytes: 766 {cells}/uL (ref 200–950)
Basophils Absolute: 113 {cells}/uL (ref 0–200)
Basophils Relative: 1.3 %
Eosinophils Absolute: 226 {cells}/uL (ref 15–500)
Eosinophils Relative: 2.6 %
HCT: 38 % (ref 35.0–45.0)
Hemoglobin: 12.3 g/dL (ref 11.7–15.5)
MCH: 31.1 pg (ref 27.0–33.0)
MCHC: 32.4 g/dL (ref 32.0–36.0)
MCV: 96 fL (ref 80.0–100.0)
MPV: 10.4 fL (ref 7.5–12.5)
Monocytes Relative: 8.8 %
Neutro Abs: 4985 {cells}/uL (ref 1500–7800)
Neutrophils Relative %: 57.3 %
Platelets: 274 10*3/uL (ref 140–400)
RBC: 3.96 10*6/uL (ref 3.80–5.10)
RDW: 12.8 % (ref 11.0–15.0)
Total Lymphocyte: 30 %
WBC: 8.7 10*3/uL (ref 3.8–10.8)

## 2023-11-29 LAB — LIPID PANEL
Cholesterol: 113 mg/dL (ref <200)
HDL: 50 mg/dL (ref >=50)
LDL Cholesterol (Calc): 49 mg/dL
Non-HDL Cholesterol (Calc): 63 mg/dL (ref <130)
Total CHOL/HDL Ratio: 2.3 (calc) (ref <5.0)
Triglycerides: 65 mg/dL (ref <150)

## 2023-11-29 LAB — COMPLETE METABOLIC PANEL WITH GFR
AG Ratio: 1.7 (calc) (ref 1.0–2.5)
ALT: 12 U/L (ref 6–29)
AST: 14 U/L (ref 10–35)
Albumin: 4.3 g/dL (ref 3.6–5.1)
Alkaline phosphatase (APISO): 61 U/L (ref 37–153)
BUN/Creatinine Ratio: 19 (calc) (ref 6–22)
BUN: 25 mg/dL (ref 7–25)
CO2: 28 mmol/L (ref 20–32)
Calcium: 9.8 mg/dL (ref 8.6–10.4)
Chloride: 105 mmol/L (ref 98–110)
Creat: 1.29 mg/dL — ABNORMAL HIGH (ref 0.60–0.95)
Globulin: 2.5 g/dL (ref 1.9–3.7)
Glucose, Bld: 103 mg/dL — ABNORMAL HIGH (ref 65–99)
Potassium: 4.7 mmol/L (ref 3.5–5.3)
Sodium: 143 mmol/L (ref 135–146)
Total Bilirubin: 0.3 mg/dL (ref 0.2–1.2)
Total Protein: 6.8 g/dL (ref 6.1–8.1)
eGFR: 42 mL/min/{1.73_m2} — ABNORMAL LOW (ref >=60)

## 2023-11-29 LAB — HEMOGLOBIN A1C
Hgb A1c MFr Bld: 6.2 %{Hb} — ABNORMAL HIGH (ref <5.7)
Mean Plasma Glucose: 131 mg/dL
eAG (mmol/L): 7.3 mmol/L

## 2023-11-29 LAB — VITAMIN B12: Vitamin B-12: 279 pg/mL (ref 200–1100)

## 2023-11-30 ENCOUNTER — Other Ambulatory Visit: Payer: Self-pay | Admitting: Family Medicine

## 2023-11-30 DIAGNOSIS — E038 Other specified hypothyroidism: Secondary | ICD-10-CM

## 2023-12-07 ENCOUNTER — Ambulatory Visit: Payer: Medicare Other | Admitting: Family Medicine

## 2023-12-07 ENCOUNTER — Encounter: Payer: Self-pay | Admitting: Family Medicine

## 2023-12-07 VITALS — BP 136/72 | HR 75 | Temp 98.3°F | Ht 63.0 in | Wt 193.8 lb

## 2023-12-07 DIAGNOSIS — Z7985 Long-term (current) use of injectable non-insulin antidiabetic drugs: Secondary | ICD-10-CM

## 2023-12-07 DIAGNOSIS — I10 Essential (primary) hypertension: Secondary | ICD-10-CM | POA: Diagnosis not present

## 2023-12-07 DIAGNOSIS — E785 Hyperlipidemia, unspecified: Secondary | ICD-10-CM | POA: Diagnosis not present

## 2023-12-07 DIAGNOSIS — E1121 Type 2 diabetes mellitus with diabetic nephropathy: Secondary | ICD-10-CM

## 2023-12-07 MED ORDER — CETIRIZINE HCL 10 MG PO TABS: 10.0000 mg | ORAL_TABLET | Freq: Every day | ORAL | 11 refills | Status: AC

## 2023-12-07 NOTE — Progress Notes (Signed)
Subjective:    Patient ID: Whitney Morales, female    DOB: 06-22-1941, 83 y.o.   MRN: [id number]  Patient is a very pleasant 83 year old Caucasian female here today for follow-up of her chronic medical problems.  Her blood pressure today is well-controlled.  She denies any chest pain shortness of breath or dyspnea on exertion.  She does have some epigastric discomfort and bloating and gas from Ozempic but she prefers to stay at the current dose of the medication.  Her most recent lab work is listed below.   Lab on 11/28/2023  Component Date Value Ref Range Status   WBC 11/28/2023 8.7  3.8 - 10.8 Thousand/uL Final   RBC 11/28/2023 3.96  3.80 - 5.10 Million/uL Final   Hemoglobin 11/28/2023 12.3  11.7 - 15.5 g/dL Final   HCT 78/29/5621 38.0  35.0 - 45.0 % Final   MCV 11/28/2023 96.0  80.0 - 100.0 fL Final   MCH 11/28/2023 31.1  27.0 - 33.0 pg Final   MCHC 11/28/2023 32.4  32.0 - 36.0 g/dL Final   Comment: For adults, a slight decrease in the calculated MCHC value (in the range of 30 to 32 g/dL) is most likely not clinically significant; however, it should be interpreted with caution in correlation with other red cell parameters and the patient's clinical condition.    RDW 11/28/2023 12.8  11.0 - 15.0 % Final   Platelets 11/28/2023 274  140 - 400 Thousand/uL Final   MPV 11/28/2023 10.4  7.5 - 12.5 fL Final   Neutro Abs 11/28/2023 4,985  1,500 - 7,800 cells/uL Final   Absolute Lymphocytes 11/28/2023 2,610  850 - 3,900 cells/uL Final   Absolute Monocytes 11/28/2023 766  200 - 950 cells/uL Final   Eosinophils Absolute 11/28/2023 226  15 - 500 cells/uL Final   Basophils Absolute 11/28/2023 113  0 - 200 cells/uL Final   Neutrophils Relative % 11/28/2023 57.3  % Final   Total Lymphocyte 11/28/2023 30.0  % Final   Monocytes Relative 11/28/2023 8.8  % Final   Eosinophils Relative 11/28/2023 2.6  % Final   Basophils Relative 11/28/2023 1.3  % Final   Glucose, Bld 11/28/2023 103 (H)  65 - 99  mg/dL Final   Comment: .            Fasting reference interval . For someone without known diabetes, a glucose value between 100 and 125 mg/dL is consistent with prediabetes and should be confirmed with a follow-up test. .    BUN 11/28/2023 25  7 - 25 mg/dL Final   Creat 30/86/5784 1.29 (H)  0.60 - 0.95 mg/dL Final   eGFR 69/62/9528 42 (L)  > OR = 60 mL/min/1.67m2 Final   BUN/Creatinine Ratio 11/28/2023 19  6 - 22 (calc) Final   Sodium 11/28/2023 143  135 - 146 mmol/L Final   Potassium 11/28/2023 4.7  3.5 - 5.3 mmol/L Final   Chloride 11/28/2023 105  98 - 110 mmol/L Final   CO2 11/28/2023 28  20 - 32 mmol/L Final   Calcium 11/28/2023 9.8  8.6 - 10.4 mg/dL Final   Total Protein 41/32/4401 6.8  6.1 - 8.1 g/dL Final   Albumin 02/72/5366 4.3  3.6 - 5.1 g/dL Final   Globulin 44/12/4740 2.5  1.9 - 3.7 g/dL (calc) Final   AG Ratio 11/28/2023 1.7  1.0 - 2.5 (calc) Final   Total Bilirubin 11/28/2023 0.3  0.2 - 1.2 mg/dL Final   Alkaline phosphatase (APISO) 11/28/2023 61  37 -  153 U/L Final   AST 11/28/2023 14  10 - 35 U/L Final   ALT 11/28/2023 12  6 - 29 U/L Final   Hgb A1c MFr Bld 11/28/2023 6.2 (H)  <5.7 % of total Hgb Final   Comment: For someone without known diabetes, a hemoglobin  A1c value between 5.7% and 6.4% is consistent with prediabetes and should be confirmed with a  follow-up test. . For someone with known diabetes, a value <7% indicates that their diabetes is well controlled. A1c targets should be individualized based on duration of diabetes, age, comorbid conditions, and other considerations. . This assay result is consistent with an increased risk of diabetes. . Currently, no consensus exists regarding use of hemoglobin A1c for diagnosis of diabetes for children. .    Mean Plasma Glucose 11/28/2023 131  mg/dL Final   eAG (mmol/L) 62/13/0865 7.3  mmol/L Final   Cholesterol 11/28/2023 113  <200 mg/dL Final   HDL 78/46/9629 50  > OR = 50 mg/dL Final    Triglycerides 11/28/2023 65  <150 mg/dL Final   LDL Cholesterol (Calc) 11/28/2023 49  mg/dL (calc) Final   Comment: Reference range: <100 . Desirable range <100 mg/dL for primary prevention;   <70 mg/dL for patients with CHD or diabetic patients  with > or = 2 CHD risk factors. Marland Kitchen LDL-C is now calculated using the Martin-Hopkins  calculation, which is a validated novel method providing  better accuracy than the Friedewald equation in the  estimation of LDL-C.  Horald Pollen et al. Lenox Ahr. 5284;132(44): 2061-2068  (http://education.[facility].com/faq/FAQ164)    Total CHOL/HDL Ratio 11/28/2023 2.3  <0.1 (calc) Final   Non-HDL Cholesterol (Calc) 11/28/2023 63  <130 mg/dL (calc) Final   Comment: For patients with diabetes plus 1 major ASCVD risk  factor, treating to a non-HDL-C goal of <100 mg/dL  (LDL-C of <02 mg/dL) is considered a therapeutic  option.    Vitamin B-12 11/28/2023 279  200 - 1,100 pg/mL Final   Comment: . Please Note: Although the reference range for vitamin B12 is 346 142 7819 pg/mL, it has been reported that between 5 and 10% of patients with values between 200 and 400 pg/mL may experience neuropsychiatric and hematologic abnormalities due to occult B12 deficiency; less than 1% of patients with values above 400 pg/mL will have symptoms. .     Past Medical History:  Diagnosis Date   Abnormal finding on EKG 10/29/2012   FOLLOW UP NUCLEAR STRESS TEST ON 11/05/12 -NORMAL   Corneal erosion 2012   RIGHT EYE--HAS RESOLVED--NO PROBLEMS SINCE   Diabetes mellitus    Diverticulitis    Fatigue    GERD (gastroesophageal reflux disease)    PAST HX--NO PROBLEMS NOW-PT DOES TAKE DAILY TUMS AT BEDTIME   Goiter    cyst vs goiter on thyroid   H/O hiatal hernia    High cholesterol    Hypertension    Obesity    Osteopenia    Past Surgical History:  Procedure Laterality Date   ABDOMINAL HYSTERECTOMY     BREAST LUMPECTOMY     BENIGN   CARDIOVASCULAR STRESS TEST  06/08/2009    EF 78%, NO ISCHEMIA   THYROID LOBECTOMY  11/27/2012   Procedure: THYROID LOBECTOMY;  Surgeon: Axel Filler, MD;  Location: [facility];  Service: General;  Laterality: Right;  Right Thyroid Lobectomy   TONSILLECTOMY     US ECHOCARDIOGRAPHY  03/23/2006   EF 55-60%   Current Outpatient Medications on File Prior to Visit  Medication Sig Dispense Refill  Accu-Chek Softclix Lancets lancets USE AS DIRECTED TO CHECK SUGAR 100 each 3   allopurinol (ZYLOPRIM) 100 MG tablet TAKE 2 TABLETS BY MOUTH EVERY DAY 120 tablet 1   amLODipine (NORVASC) 10 MG tablet TAKE 1 TABLET BY MOUTH EVERY DAY 90 tablet 3   Blood Glucose Monitoring Suppl (ACCU-CHEK AVIVA PLUS) W/DEVICE KIT PT NEEDS METER-STRIPS(1 BOTTLE =100/5 REFILLS)-LANCETS(1 BOX/5 REFILLS) CHECKS BS QD DX: 250.00 1 kit 0   furosemide (LASIX) 20 MG tablet TAKE 1 TABLET BY MOUTH EVERY DAY 90 tablet 0   glucose blood (ACCU-CHEK AVIVA PLUS) test strip CHECK BLOOD SUGAR TWICE DAILY FASTING IN MORNING AND THEN 2 HOURS AFTER A MEAL E11.65O 100 strip 3   levothyroxine (SYNTHROID) 88 MCG tablet Take 1 tablet by mouth once daily 90 tablet 0   lisinopril (ZESTRIL) 20 MG tablet Take 1 tablet (20 mg total) by mouth 2 (two) times daily. 180 tablet 1   Multiple Vitamin (MULTIVITAMIN WITH MINERALS) TABS Take 1 tablet by mouth daily.     Nebivolol HCl 20 MG TABS Take 1 tablet (20 mg total) by mouth daily. 90 tablet 3   rosuvastatin (CRESTOR) 20 MG tablet Take 0.5 tablets (10 mg total) by mouth daily. 90 tablet 1   Semaglutide, 1 MG/DOSE, 4 MG/3ML SOPN Inject 1 mg as directed once a week. 9 mL 3   vitamin E 180 MG (400 UNITS) capsule Take 400 Units by mouth daily.     No current facility-administered medications on file prior to visit.   Allergies  Allergen Reactions   Clindamycin/Lincomycin     CLINDAMYCIN TAKEN WITH CIPRO CAUSED SEVERE NAUSEA--PT STATES SHE CAN TAKE CIPRO ALONE WITHOUT PROBLEM--IT WAS JUST THE COMBINATION SHE DID NOT TOLERATE.   Levaquin [Levofloxacin]  Nausea And Vomiting   Metronidazole Hcl Other (See Comments)    Deathly sick WHEN PT TOOK METRONIDAZOLE WITH CIPRO.  PT STATES SHE HAS TAKEN CIPRO ALONE WITHOUT PROBLEM --JUST DID NOT TOLERATE THE COMBINATION OF BOTH DRUGS TAKEN TOGETHER.   Social History   Socioeconomic History   Marital status: Married    Spouse name: Dorene Sorrow   Number of children: 2   Years of education: Not on file   Highest education level: Not on file  Occupational History   Not on file  Tobacco Use   Smoking status: Never   Smokeless tobacco: Never  Substance and Sexual Activity   Alcohol use: No   Drug use: No   Sexual activity: Not Currently  Other Topics Concern   Not on file  Social History Narrative   2 sons   Hayes Ludwig and great grandchildren   Social Drivers of Corporate investment banker Strain: Low Risk  (01/18/2023)   Overall Financial Resource Strain (CARDIA)    Difficulty of Paying Living Expenses: Not hard at all  Food Insecurity: No Food Insecurity (01/18/2023)   Hunger Vital Sign    Worried About Running Out of Food in the Last Year: Never true    Ran Out of Food in the Last Year: Never true  Transportation Needs: No Transportation Needs (01/18/2023)   PRAPARE - Administrator, Civil Service (Medical): No    Lack of Transportation (Non-Medical): No  Physical Activity: Sufficiently Active (01/18/2023)   Exercise Vital Sign    Days of Exercise per Week: 5 days    Minutes of Exercise per Session: 30 min  Stress: No Stress Concern Present (01/18/2023)   [employer] of Occupational Health - Occupational Stress Questionnaire  Feeling of Stress : Not at all  Social Connections: Moderately Integrated (01/18/2023)   Social Connection and Isolation Panel [NHANES]    Frequency of Communication with Friends and Family: More than three times a week    Frequency of Social Gatherings with Friends and Family: More than three times a week    Attends Religious Services: 1 to 4  times per year    Active Member of [employer] or Organizations: No    Attends Banker Meetings: Never    Marital Status: Married  Catering manager Violence: Not At Risk (01/18/2023)   Humiliation, Afraid, Rape, and Kick questionnaire    Fear of Current or Ex-Partner: No    Emotionally Abused: No    Physically Abused: No    Sexually Abused: No   No family history on file.     Review of Systems  All other systems reviewed and are negative.      Objective:   Physical Exam Vitals reviewed.  Constitutional:      General: She is not in acute distress.    Appearance: Normal appearance. She is well-developed and normal weight. She is not ill-appearing, toxic-appearing or diaphoretic.  HENT:     Right Ear: Tympanic membrane, ear canal and external ear normal.     Left Ear: Tympanic membrane, ear canal and external ear normal.     Nose: Nose normal.     Mouth/Throat:     Mouth: Mucous membranes are moist.     Pharynx: Oropharynx is clear. No oropharyngeal exudate or posterior oropharyngeal erythema.  Eyes:     Extraocular Movements: Extraocular movements intact.     Conjunctiva/sclera: Conjunctivae normal.     Pupils: Pupils are equal, round, and reactive to light.  Neck:     Thyroid: No thyromegaly.     Vascular: No carotid bruit or JVD.  Cardiovascular:     Rate and Rhythm: Normal rate and regular rhythm.     Pulses: Normal pulses.     Heart sounds: Normal heart sounds. No murmur heard.    No friction rub. No gallop.  Pulmonary:     Effort: Pulmonary effort is normal. No respiratory distress.     Breath sounds: Normal breath sounds. No stridor. No wheezing, rhonchi or rales.  Chest:     Chest wall: No tenderness.  Abdominal:     General: Bowel sounds are normal. There is no distension.     Palpations: Abdomen is soft. There is no mass.     Tenderness: There is no abdominal tenderness. There is no guarding or rebound.  Musculoskeletal:     Cervical back: Normal  range of motion and neck supple. No rigidity.     Right lower leg: No edema.     Left lower leg: No edema.  Lymphadenopathy:     Cervical: No cervical adenopathy.  Skin:    Coloration: Skin is not jaundiced or pale.     Findings: No bruising, erythema, lesion or rash.  Neurological:     General: No focal deficit present.     Mental Status: She is alert and oriented to person, place, and time. Mental status is at baseline.     Cranial Nerves: No cranial nerve deficit.     Motor: No weakness or abnormal muscle tone.     Coordination: Coordination normal.     Gait: Gait normal.     Deep Tendon Reflexes: Reflexes normal.  Psychiatric:        Mood and Affect: Mood  normal.        Behavior: Behavior normal.        Thought Content: Thought content normal.        Judgment: Judgment normal.           Assessment & Plan:  Controlled type 2 diabetes mellitus with diabetic nephropathy, without long-term current use of insulin (HCC)  Hyperlipidemia, unspecified hyperlipidemia type  Essential hypertension, benign I am very happy with her blood pressure, her cholesterol, and her hemoglobin A1c.  I encouraged the patient to take a vitamin with a B12 supplement and also to try to drink more water.  I would not make any changes in her medication at this time.  Diabetic foot exam is normal.  Recheck in 6 months.

## 2023-12-31 ENCOUNTER — Ambulatory Visit: Payer: Self-pay | Admitting: Family Medicine

## 2023-12-31 NOTE — Telephone Encounter (Signed)
  Chief Complaint: eye discomfort, Pt also thinks she may have a UTI Symptoms: Eye feels touchy, discomfort Frequency: 3-4 days Pertinent Negatives: Patient denies fever, SOB Disposition: [] ED /[] Urgent Care (no appt availability in office) / [x] Appointment(In office/virtual)/ []  [facility]/ [] Home Care/ [] Refused Recommended Disposition /[] [facility]/ []  Follow-up with PCP Additional Notes: Pt states for the past 3-4 days she has had some eye discomfort. Perhaps very mild swelling, no redness. She states it feels "touchy". Last night pt awoke with need to urinate, was only able to urinate a small amount.  This has since resolved. Pt is urinating normally today.   Copied from CRM [id number]. Topic: Clinical - Red Word Triage >> Dec 31, 2023  8:45 AM Ivette P wrote: Red Word that prompted transfer to Nurse Triage: check left  eye - sore, real touchy, nothing in there and nothing wrong. 3-4 days, might have a possible UTI, Reason for Disposition  Eye pain present > 24 hours  Answer Assessment - Initial Assessment Questions 1. ONSET: "When did the pain start?" (e.g., minutes, hours, days)     3-4 days 2. TIMING: "Does the pain come and go, or has it been constant since it started?" (e.g., constant, intermittent, fleeting)     constant 3. SEVERITY: "How bad is the pain?"   (Scale 1-10; mild, moderate or severe)   - MILD (1-3): doesn't interfere with normal activities    - MODERATE (4-7): interferes with normal activities or awakens from sleep    - SEVERE (8-10): excruciating pain and patient unable to do normal activities     Uncomfortable 4. LOCATION: "Where does it hurt?"  (e.g., eyelid, eye, cheekbone)     Left eye 5. CAUSE: "What do you think is causing the pain?"     Unsure 6. VISION: "Do you have blurred vision or changes in your vision?"      no 7. EYE DISCHARGE: "Is there any discharge (pus) from the eye(s)?"  If Yes, ask: "What color is it?"      no 8.  FEVER: "Do you have a fever?" If Yes, ask: "What is it, how was it measured, and when did it start?"      no 9. OTHER SYMPTOMS: "Do you have any other symptoms?" (e.g., headache, nasal discharge, facial rash)     No - sinus congestion  Protocols used: Eye Pain and Other Symptoms-A-AH

## 2024-01-01 ENCOUNTER — Ambulatory Visit: Admitting: Family Medicine

## 2024-01-01 VITALS — BP 136/68 | HR 76 | Temp 98.5°F | Ht 63.0 in | Wt 192.0 lb

## 2024-01-01 DIAGNOSIS — R300 Dysuria: Secondary | ICD-10-CM | POA: Diagnosis not present

## 2024-01-01 DIAGNOSIS — J329 Chronic sinusitis, unspecified: Secondary | ICD-10-CM | POA: Diagnosis not present

## 2024-01-01 LAB — URINALYSIS, ROUTINE W REFLEX MICROSCOPIC
Bacteria, UA: NONE SEEN /HPF
Bilirubin Urine: NEGATIVE
Glucose, UA: NEGATIVE
Hgb urine dipstick: NEGATIVE
Hyaline Cast: NONE SEEN /LPF
Ketones, ur: NEGATIVE
Nitrite: NEGATIVE
Protein, ur: NEGATIVE
RBC / HPF: NONE SEEN /HPF (ref 0–2)
Specific Gravity, Urine: 1.02 (ref 1.001–1.035)
pH: 5.5 (ref 5.0–8.0)

## 2024-01-01 LAB — MICROSCOPIC MESSAGE

## 2024-01-01 MED ORDER — AMOXICILLIN-POT CLAVULANATE 875-125 MG PO TABS: 1.0000 | ORAL_TABLET | Freq: Two times a day (BID) | ORAL | 0 refills | Status: DC

## 2024-01-01 MED ORDER — SEMAGLUTIDE (2 MG/DOSE) 8 MG/3ML ~~LOC~~ SOPN: 2.0000 mg | PEN_INJECTOR | SUBCUTANEOUS | 11 refills | Status: DC

## 2024-01-01 MED ORDER — FLUTICASONE PROPIONATE 50 MCG/ACT NA SUSP: 2.0000 | Freq: Every day | NASAL | 6 refills | Status: AC

## 2024-01-01 NOTE — Progress Notes (Signed)
 Subjective:    Patient ID: Whitney Morales, female    DOB: Feb 08, 1941, 83 y.o.   MRN: [id number]  Patient reports pain in her left ear.  Whitney Morales reports pain in her left maxillary sinus.  Whitney Morales reports pain and pressure behind her left eye.  Whitney Morales reports mucus and head congestion.  Symptoms have been present off and on for about a week.  Whitney Morales also reports some mild dysuria but urinalysis only shows trace leukocyte esterase  Past Medical History:  Diagnosis Date   Abnormal finding on EKG 10/29/2012   FOLLOW UP NUCLEAR STRESS TEST ON 11/05/12 -NORMAL   Corneal erosion 2012   RIGHT EYE--HAS RESOLVED--NO PROBLEMS SINCE   Diabetes mellitus    Diverticulitis    Fatigue    GERD (gastroesophageal reflux disease)    PAST HX--NO PROBLEMS NOW-PT DOES TAKE DAILY TUMS AT BEDTIME   Goiter    cyst vs goiter on thyroid   H/O hiatal hernia    High cholesterol    Hypertension    Obesity    Osteopenia    Past Surgical History:  Procedure Laterality Date   ABDOMINAL HYSTERECTOMY     BREAST LUMPECTOMY     BENIGN   CARDIOVASCULAR STRESS TEST  06/08/2009   EF 78%, NO ISCHEMIA   THYROID LOBECTOMY  11/27/2012   Procedure: THYROID LOBECTOMY;  Surgeon: Axel Filler, MD;  Location: [facility];  Service: General;  Laterality: Right;  Right Thyroid Lobectomy   TONSILLECTOMY     US ECHOCARDIOGRAPHY  03/23/2006   EF 55-60%   Current Outpatient Medications on File Prior to Visit  Medication Sig Dispense Refill   Accu-Chek Softclix Lancets lancets USE AS DIRECTED TO CHECK SUGAR 100 each 3   allopurinol (ZYLOPRIM) 100 MG tablet TAKE 2 TABLETS BY MOUTH EVERY DAY 120 tablet 1   amLODipine (NORVASC) 10 MG tablet TAKE 1 TABLET BY MOUTH EVERY DAY 90 tablet 3   Blood Glucose Monitoring Suppl (ACCU-CHEK AVIVA PLUS) W/DEVICE KIT PT NEEDS METER-STRIPS(1 BOTTLE =100/5 REFILLS)-LANCETS(1 BOX/5 REFILLS) CHECKS BS QD DX: 250.00 1 kit 0   cetirizine (ZYRTEC) 10 MG tablet Take 1 tablet (10 mg total) by mouth daily. 30 tablet 11    furosemide (LASIX) 20 MG tablet TAKE 1 TABLET BY MOUTH EVERY DAY 90 tablet 0   glucose blood (ACCU-CHEK AVIVA PLUS) test strip CHECK BLOOD SUGAR TWICE DAILY FASTING IN MORNING AND THEN 2 HOURS AFTER A MEAL E11.65O 100 strip 3   levothyroxine (SYNTHROID) 88 MCG tablet Take 1 tablet by mouth once daily 90 tablet 0   lisinopril (ZESTRIL) 20 MG tablet Take 1 tablet (20 mg total) by mouth 2 (two) times daily. 180 tablet 1   Multiple Vitamin (MULTIVITAMIN WITH MINERALS) TABS Take 1 tablet by mouth daily.     Nebivolol HCl 20 MG TABS Take 1 tablet (20 mg total) by mouth daily. 90 tablet 3   rosuvastatin (CRESTOR) 20 MG tablet Take 0.5 tablets (10 mg total) by mouth daily. 90 tablet 1   Semaglutide, 1 MG/DOSE, 4 MG/3ML SOPN Inject 1 mg as directed once a week. 9 mL 3   vitamin E 180 MG (400 UNITS) capsule Take 400 Units by mouth daily.     No current facility-administered medications on file prior to visit.   Allergies  Allergen Reactions   Clindamycin/Lincomycin     CLINDAMYCIN TAKEN WITH CIPRO CAUSED SEVERE NAUSEA--PT STATES Whitney Morales CAN TAKE CIPRO ALONE WITHOUT PROBLEM--IT WAS JUST THE COMBINATION Whitney Morales DID NOT TOLERATE.  Levaquin [Levofloxacin] Nausea And Vomiting   Metronidazole Hcl Other (See Comments)    Deathly sick WHEN PT TOOK METRONIDAZOLE WITH CIPRO.  PT STATES Whitney Morales HAS TAKEN CIPRO ALONE WITHOUT PROBLEM --JUST DID NOT TOLERATE THE COMBINATION OF BOTH DRUGS TAKEN TOGETHER.   Social History   Socioeconomic History   Marital status: Married    Spouse name: Dorene Sorrow   Number of children: 2   Years of education: Not on file   Highest education level: Not on file  Occupational History   Not on file  Tobacco Use   Smoking status: Never   Smokeless tobacco: Never  Substance and Sexual Activity   Alcohol use: No   Drug use: No   Sexual activity: Not Currently  Other Topics Concern   Not on file  Social History Narrative   2 sons   Hayes Ludwig and great grandchildren   Social Drivers of  Corporate investment banker Strain: Low Risk  (01/18/2023)   Overall Financial Resource Strain (CARDIA)    Difficulty of Paying Living Expenses: Not hard at all  Food Insecurity: No Food Insecurity (01/18/2023)   Hunger Vital Sign    Worried About Running Out of Food in the Last Year: Never true    Ran Out of Food in the Last Year: Never true  Transportation Needs: No Transportation Needs (01/18/2023)   PRAPARE - Administrator, Civil Service (Medical): No    Lack of Transportation (Non-Medical): No  Physical Activity: Sufficiently Active (01/18/2023)   Exercise Vital Sign    Days of Exercise per Week: 5 days    Minutes of Exercise per Session: 30 min  Stress: No Stress Concern Present (01/18/2023)   [employer] of Occupational Health - Occupational Stress Questionnaire    Feeling of Stress : Not at all  Social Connections: Moderately Integrated (01/18/2023)   Social Connection and Isolation Panel [NHANES]    Frequency of Communication with Friends and Family: More than three times a week    Frequency of Social Gatherings with Friends and Family: More than three times a week    Attends Religious Services: 1 to 4 times per year    Active Member of [employer] or Organizations: No    Attends Banker Meetings: Never    Marital Status: Married  Catering manager Violence: Not At Risk (01/18/2023)   Humiliation, Afraid, Rape, and Kick questionnaire    Fear of Current or Ex-Partner: No    Emotionally Abused: No    Physically Abused: No    Sexually Abused: No   No family history on file.     Review of Systems  All other systems reviewed and are negative.      Objective:   Physical Exam Vitals reviewed.  Constitutional:      General: Whitney Morales is not in acute distress.    Appearance: Normal appearance. Whitney Morales is well-developed and normal weight. Whitney Morales is not ill-appearing, toxic-appearing or diaphoretic.  HENT:     Right Ear: Tympanic membrane, ear canal and external  ear normal.     Left Ear: Ear canal and external ear normal. Tympanic membrane is bulging.     Nose: Rhinorrhea present.     Left Sinus: Maxillary sinus tenderness present.     Mouth/Throat:     Mouth: Mucous membranes are moist.     Pharynx: Oropharynx is clear. No oropharyngeal exudate or posterior oropharyngeal erythema.  Eyes:     Extraocular Movements: Extraocular movements intact.  Conjunctiva/sclera: Conjunctivae normal.     Pupils: Pupils are equal, round, and reactive to light.  Neck:     Thyroid: No thyromegaly.     Vascular: No carotid bruit or JVD.  Cardiovascular:     Rate and Rhythm: Normal rate and regular rhythm.     Pulses: Normal pulses.     Heart sounds: Normal heart sounds. No murmur heard.    No friction rub. No gallop.  Pulmonary:     Effort: Pulmonary effort is normal. No respiratory distress.     Breath sounds: Normal breath sounds. No stridor. No wheezing, rhonchi or rales.  Chest:     Chest wall: No tenderness.  Abdominal:     General: Bowel sounds are normal. There is no distension.     Palpations: Abdomen is soft. There is no mass.     Tenderness: There is no abdominal tenderness. There is no guarding or rebound.  Musculoskeletal:     Cervical back: Normal range of motion and neck supple. No rigidity.     Right lower leg: No edema.     Left lower leg: No edema.  Lymphadenopathy:     Cervical: No cervical adenopathy.  Skin:    Coloration: Skin is not jaundiced or pale.     Findings: No bruising, erythema, lesion or rash.  Neurological:     General: No focal deficit present.     Mental Status: Whitney Morales is alert and oriented to person, place, and time. Mental status is at baseline.     Cranial Nerves: No cranial nerve deficit.     Motor: No weakness or abnormal muscle tone.     Coordination: Coordination normal.     Gait: Gait normal.     Deep Tendon Reflexes: Reflexes normal.  Psychiatric:        Mood and Affect: Mood normal.        Behavior:  Behavior normal.        Thought Content: Thought content normal.        Judgment: Judgment normal.           Assessment & Plan:  Dysuria - Plan: Urinalysis, Routine w reflex microscopic, Urine Culture  Rhinosinusitis Urinalysis is not conclusive for urinary tract infection but I do believe Whitney Morales has a left maxillary sinus infection.  Begin Augmentin 875 mg twice daily for 10 days.  Use Flonase 2 sprays each nostril daily.

## 2024-01-02 LAB — HM MAMMOGRAPHY

## 2024-01-03 LAB — URINE CULTURE
MICRO NUMBER:: 16186496
SPECIMEN QUALITY:: ADEQUATE

## 2024-01-18 ENCOUNTER — Other Ambulatory Visit: Payer: Self-pay | Admitting: Family Medicine

## 2024-01-18 DIAGNOSIS — I10 Essential (primary) hypertension: Secondary | ICD-10-CM

## 2024-01-21 ENCOUNTER — Other Ambulatory Visit: Payer: Self-pay | Admitting: Family Medicine

## 2024-01-21 DIAGNOSIS — I10 Essential (primary) hypertension: Secondary | ICD-10-CM

## 2024-01-21 NOTE — Telephone Encounter (Signed)
 Unable to refill per protocol, Rx expired. Discontinued on 08/16/23, dose change.  Requested Prescriptions  Pending Prescriptions Disp Refills   nebivolol (BYSTOLIC) 5 MG tablet [Pharmacy Med Name: NEBIVOLOL 5 MG TABLET] 90 tablet 3    Sig: TAKE 1 TABLET (5 MG TOTAL) BY MOUTH DAILY.     Cardiovascular: Beta Blockers 3 Failed - 01/21/2024  2:15 PM      Failed - Cr in normal range and within 360 days    Creat  Date Value Ref Range Status  11/28/2023 1.29 (H) 0.60 - 0.95 mg/dL Final   Creatinine, Urine  Date Value Ref Range Status  07/05/2023 157 20 - 275 mg/dL Final         Passed - AST in normal range and within 360 days    AST  Date Value Ref Range Status  11/28/2023 14 10 - 35 U/L Final         Passed - ALT in normal range and within 360 days    ALT  Date Value Ref Range Status  11/28/2023 12 6 - 29 U/L Final         Passed - Last BP in normal range    BP Readings from Last 1 Encounters:  01/01/24 136/68         Passed - Last Heart Rate in normal range    Pulse Readings from Last 1 Encounters:  01/01/24 76         Passed - Valid encounter within last 6 months    Recent Outpatient Visits           2 weeks ago Dysuria   [place] [facility] Donita Brooks, MD   1 month ago Controlled type 2 diabetes mellitus with diabetic nephropathy, without long-term current use of insulin [facility])   [place] [facility] Pickard, Priscille Heidelberg, MD   5 months ago Pure hypercholesterolemia   [place] [facility] Donita Brooks, MD   6 months ago Controlled type 2 diabetes mellitus with diabetic nephropathy, without long-term current use of insulin [facility])   [place] [facility] Pickard, Priscille Heidelberg, MD   10 months ago Acquired dysmorphic toenail   [place] [facility] Pickard, Priscille Heidelberg, MD

## 2024-01-23 NOTE — Telephone Encounter (Signed)
 Unable to refill per protocol, Rx expired. Discontinued 10/24//24, dose change.  Requested Prescriptions  Pending Prescriptions Disp Refills   nebivolol (BYSTOLIC) 5 MG tablet [Pharmacy Med Name: NEBIVOLOL 5 MG TABLET] 90 tablet 3    Sig: TAKE 1 TABLET (5 MG TOTAL) BY MOUTH DAILY.     Cardiovascular: Beta Blockers 3 Failed - 01/23/2024  2:39 PM      Failed - Cr in normal range and within 360 days    Creat  Date Value Ref Range Status  11/28/2023 1.29 (H) 0.60 - 0.95 mg/dL Final   Creatinine, Urine  Date Value Ref Range Status  07/05/2023 157 20 - 275 mg/dL Final         Passed - AST in normal range and within 360 days    AST  Date Value Ref Range Status  11/28/2023 14 10 - 35 U/L Final         Passed - ALT in normal range and within 360 days    ALT  Date Value Ref Range Status  11/28/2023 12 6 - 29 U/L Final         Passed - Last BP in normal range    BP Readings from Last 1 Encounters:  01/01/24 136/68         Passed - Last Heart Rate in normal range    Pulse Readings from Last 1 Encounters:  01/01/24 76         Passed - Valid encounter within last 6 months    Recent Outpatient Visits           3 weeks ago Dysuria   [place] [facility] Donita Brooks, MD   1 month ago Controlled type 2 diabetes mellitus with diabetic nephropathy, without long-term current use of insulin [facility])   [place] [facility] Pickard, Priscille Heidelberg, MD   5 months ago Pure hypercholesterolemia   [place] [facility] Donita Brooks, MD   6 months ago Controlled type 2 diabetes mellitus with diabetic nephropathy, without long-term current use of insulin [facility])   [place] [facility] Pickard, Priscille Heidelberg, MD   11 months ago Acquired dysmorphic toenail   [place] [facility] Pickard, Priscille Heidelberg, MD

## 2024-02-23 ENCOUNTER — Other Ambulatory Visit: Payer: Self-pay | Admitting: Family Medicine

## 2024-02-23 DIAGNOSIS — E7800 Pure hypercholesterolemia, unspecified: Secondary | ICD-10-CM

## 2024-02-23 DIAGNOSIS — N1831 Chronic kidney disease, stage 3a: Secondary | ICD-10-CM

## 2024-03-04 ENCOUNTER — Other Ambulatory Visit: Payer: Self-pay | Admitting: Family Medicine

## 2024-03-04 DIAGNOSIS — E038 Other specified hypothyroidism: Secondary | ICD-10-CM

## 2024-03-06 NOTE — Telephone Encounter (Signed)
 Requested Prescriptions  Pending Prescriptions Disp Refills   levothyroxine  (SYNTHROID ) 88 MCG tablet [Pharmacy Med Name: Levothyroxine  Sodium 88 MCG Oral Tablet] 90 tablet 0    Sig: Take 1 tablet by mouth once daily     Endocrinology:  Hypothyroid Agents Passed - 03/06/2024  8:36 AM      Passed - TSH in normal range and within 360 days    TSH  Date Value Ref Range Status  07/05/2023 1.11 0.40 - 4.50 mIU/L Final         Passed - Valid encounter within last 12 months    Recent Outpatient Visits           2 months ago Dysuria   [place] [facility] Austine Lefort, MD   3 months ago Controlled type 2 diabetes mellitus with diabetic nephropathy, without long-term current use of insulin [facility])   [place] [facility] Pickard, Cisco Crest, MD   7 months ago Pure hypercholesterolemia   [place] [facility] Austine Lefort, MD   8 months ago Controlled type 2 diabetes mellitus with diabetic nephropathy, without long-term current use of insulin [facility])   [place] [facility] Pickard, Cisco Crest, MD   1 year ago Acquired dysmorphic toenail    [facility] Pickard, Cisco Crest, MD

## 2024-03-13 ENCOUNTER — Other Ambulatory Visit: Payer: Self-pay | Admitting: Family Medicine

## 2024-03-13 DIAGNOSIS — I10 Essential (primary) hypertension: Secondary | ICD-10-CM

## 2024-04-10 ENCOUNTER — Encounter: Payer: Self-pay | Admitting: Family Medicine

## 2024-04-10 ENCOUNTER — Ambulatory Visit: Admitting: Family Medicine

## 2024-04-10 VITALS — BP 122/82 | HR 77 | Temp 98.1°F | Ht 63.0 in | Wt 202.1 lb

## 2024-04-10 DIAGNOSIS — M7542 Impingement syndrome of left shoulder: Secondary | ICD-10-CM

## 2024-04-10 MED ORDER — ACCU-CHEK AVIVA PLUS W/DEVICE KIT: PACK | 0 refills | Status: AC

## 2024-04-10 NOTE — Progress Notes (Signed)
 Subjective:    Patient ID: Whitney Morales, female    DOB: Aug 14, 1941, 83 y.o.   MRN: [id number]  Patient complains of pain in both of her shoulders left greater than right.  She has pain with abduction greater than 90 degrees.  She has a positive Hawkins sign.  She has a positive empty can sign.  However she has good strength against resisted abduction.  There is minimal crepitus on passive range of motion.  She has similar findings in her right shoulder but not as severe as her left shoulder. Past Medical History:  Diagnosis Date   Abnormal finding on EKG 10/29/2012   FOLLOW UP NUCLEAR STRESS TEST ON 11/05/12 -NORMAL   Corneal erosion 2012   RIGHT EYE--HAS RESOLVED--NO PROBLEMS SINCE   Diabetes mellitus    Diverticulitis    Fatigue    GERD (gastroesophageal reflux disease)    PAST HX--NO PROBLEMS NOW-PT DOES TAKE DAILY TUMS AT BEDTIME   Goiter    cyst vs goiter on thyroid    H/O hiatal hernia    High cholesterol    Hypertension    Obesity    Osteopenia    Past Surgical History:  Procedure Laterality Date   ABDOMINAL HYSTERECTOMY     BREAST LUMPECTOMY     BENIGN   CARDIOVASCULAR STRESS TEST  06/08/2009   EF 78%, NO ISCHEMIA   THYROID  LOBECTOMY  11/27/2012   Procedure: THYROID  LOBECTOMY;  Surgeon: Shela Derby, MD;  Location: [facility];  Service: General;  Laterality: Right;  Right Thyroid  Lobectomy   TONSILLECTOMY     US  ECHOCARDIOGRAPHY  03/23/2006   EF 55-60%   Current Outpatient Medications on File Prior to Visit  Medication Sig Dispense Refill   Accu-Chek Softclix Lancets lancets USE AS DIRECTED TO CHECK SUGAR 100 each 3   allopurinol  (ZYLOPRIM ) 100 MG tablet TAKE 2 TABLETS BY MOUTH EVERY DAY 120 tablet 1   amLODipine  (NORVASC ) 10 MG tablet TAKE 1 TABLET BY MOUTH EVERY DAY 90 tablet 3   amoxicillin -clavulanate (AUGMENTIN ) 875-125 MG tablet Take 1 tablet by mouth 2 (two) times daily. 20 tablet 0   Blood Glucose Monitoring Suppl (ACCU-CHEK AVIVA PLUS) W/DEVICE KIT PT NEEDS  METER-STRIPS(1 BOTTLE =100/5 REFILLS)-LANCETS(1 BOX/5 REFILLS) CHECKS BS QD DX: 250.00 1 kit 0   cetirizine  (ZYRTEC ) 10 MG tablet Take 1 tablet (10 mg total) by mouth daily. 30 tablet 11   fluticasone  (FLONASE ) 50 MCG/ACT nasal spray Place 2 sprays into both nostrils daily. 16 g 6   furosemide  (LASIX ) 20 MG tablet TAKE 1 TABLET BY MOUTH EVERY DAY 90 tablet 0   glucose blood (ACCU-CHEK AVIVA PLUS) test strip CHECK BLOOD SUGAR TWICE DAILY FASTING IN MORNING AND THEN 2 HOURS AFTER A MEAL E11.65O 100 strip 3   levothyroxine  (SYNTHROID ) 88 MCG tablet Take 1 tablet by mouth once daily 90 tablet 0   lisinopril  (ZESTRIL ) 20 MG tablet Take 1 tablet (20 mg total) by mouth 2 (two) times daily. 180 tablet 1   Multiple Vitamin (MULTIVITAMIN WITH MINERALS) TABS Take 1 tablet by mouth daily.     Nebivolol  HCl 20 MG TABS Take 1 tablet (20 mg total) by mouth daily. 90 tablet 3   rosuvastatin  (CRESTOR ) 10 MG tablet TAKE 1 TABLET BY MOUTH EVERY DAY 90 tablet 1   rosuvastatin  (CRESTOR ) 20 MG tablet Take 0.5 tablets (10 mg total) by mouth daily. 90 tablet 1   Semaglutide , 2 MG/DOSE, 8 MG/3ML SOPN Inject 2 mg as directed once a week. 3  mL 11   vitamin E 180 MG (400 UNITS) capsule Take 400 Units by mouth daily.     No current facility-administered medications on file prior to visit.   Allergies  Allergen Reactions   Clindamycin/Lincomycin     CLINDAMYCIN TAKEN WITH CIPRO CAUSED SEVERE NAUSEA--PT STATES SHE CAN TAKE CIPRO ALONE WITHOUT PROBLEM--IT WAS JUST THE COMBINATION SHE DID NOT TOLERATE.   Levaquin [Levofloxacin] Nausea And Vomiting   Metronidazole Hcl Other (See Comments)    Deathly sick WHEN PT TOOK METRONIDAZOLE WITH CIPRO.  PT STATES SHE HAS TAKEN CIPRO ALONE WITHOUT PROBLEM --JUST DID NOT TOLERATE THE COMBINATION OF BOTH DRUGS TAKEN TOGETHER.   Social History   Socioeconomic History   Marital status: Married    Spouse name: Josefina Nian   Number of children: 2   Years of education: Not on file   Highest  education level: Not on file  Occupational History   Not on file  Tobacco Use   Smoking status: Never   Smokeless tobacco: Never  Substance and Sexual Activity   Alcohol use: No   Drug use: No   Sexual activity: Not Currently  Other Topics Concern   Not on file  Social History Narrative   2 sons   Kae Oram and great grandchildren   Social Drivers of Corporate investment banker Strain: Low Risk  (01/18/2023)   Overall Financial Resource Strain (CARDIA)    Difficulty of Paying Living Expenses: Not hard at all  Food Insecurity: No Food Insecurity (01/18/2023)   Hunger Vital Sign    Worried About Running Out of Food in the Last Year: Never true    Ran Out of Food in the Last Year: Never true  Transportation Needs: No Transportation Needs (01/18/2023)   PRAPARE - Administrator, Civil Service (Medical): No    Lack of Transportation (Non-Medical): No  Physical Activity: Sufficiently Active (01/18/2023)   Exercise Vital Sign    Days of Exercise per Week: 5 days    Minutes of Exercise per Session: 30 min  Stress: No Stress Concern Present (01/18/2023)   [employer] of Occupational Health - Occupational Stress Questionnaire    Feeling of Stress : Not at all  Social Connections: Moderately Integrated (01/18/2023)   Social Connection and Isolation Panel    Frequency of Communication with Friends and Family: More than three times a week    Frequency of Social Gatherings with Friends and Family: More than three times a week    Attends Religious Services: 1 to 4 times per year    Active Member of [employer] or Organizations: No    Attends Banker Meetings: Never    Marital Status: Married  Catering manager Violence: Not At Risk (01/18/2023)   Humiliation, Afraid, Rape, and Kick questionnaire    Fear of Current or Ex-Partner: No    Emotionally Abused: No    Physically Abused: No    Sexually Abused: No   No family history on file.     Review of Systems   All other systems reviewed and are negative.      Objective:   Physical Exam Vitals reviewed.  Constitutional:      General: She is not in acute distress.    Appearance: Normal appearance. She is well-developed and normal weight. She is not ill-appearing, toxic-appearing or diaphoretic.  Neck:     Thyroid : No thyromegaly.     Vascular: No JVD.   Cardiovascular:     Rate and Rhythm:  Normal rate and regular rhythm.     Pulses: Normal pulses.     Heart sounds: Normal heart sounds. No murmur heard.    No friction rub. No gallop.  Pulmonary:     Effort: Pulmonary effort is normal. No respiratory distress.     Breath sounds: Normal breath sounds. No stridor. No wheezing, rhonchi or rales.  Chest:     Chest wall: No tenderness.  Abdominal:     General: Bowel sounds are normal.   Musculoskeletal:     Right shoulder: Tenderness present. Decreased range of motion. Normal strength.     Left shoulder: Tenderness present. Decreased range of motion. Normal strength.     Right lower leg: No edema.     Left lower leg: No edema.   Skin:    Coloration: Skin is not jaundiced or pale.     Findings: No bruising, erythema, lesion or rash.   Neurological:     General: No focal deficit present.     Mental Status: She is alert and oriented to person, place, and time. Mental status is at baseline.     Cranial Nerves: No cranial nerve deficit.     Motor: No weakness or abnormal muscle tone.     Coordination: Coordination normal.     Gait: Gait normal.     Deep Tendon Reflexes: Reflexes normal.   Psychiatric:        Mood and Affect: Mood normal.        Behavior: Behavior normal.        Thought Content: Thought content normal.        Judgment: Judgment normal.           Assessment & Plan:  Impingement syndrome of left shoulder Patient has impingement syndrome in both shoulders left much worse than right.  We discussed a cortisone injection however the patient is going to try physical  therapy at home with exercises that we discussed first

## 2024-05-23 ENCOUNTER — Other Ambulatory Visit: Payer: Self-pay | Admitting: Family Medicine

## 2024-05-23 DIAGNOSIS — N1831 Chronic kidney disease, stage 3a: Secondary | ICD-10-CM

## 2024-05-23 NOTE — Telephone Encounter (Signed)
 Prescription Request  05/23/2024  LOV: 04/10/2024  What is the name of the medication or equipment?   allopurinol  (ZYLOPRIM ) 100 MG tablet  Qty for original prescription: 120.0 EA [employer]  Have you contacted your pharmacy to request a refill? Yes   Which pharmacy would you like this sent to?  [id number] - [place], [place] - [address] AT [facility] [address] [place] [address] Phone: [phone] Fax: [phone]    Patient notified that their request is being sent to the clinical staff for review and that they should receive a response within 2 business days.   Please advise pharmacist.

## 2024-05-26 MED ORDER — ALLOPURINOL 100 MG PO TABS: 200.0000 mg | ORAL_TABLET | Freq: Every day | ORAL | 0 refills | Status: DC

## 2024-05-26 NOTE — Telephone Encounter (Signed)
 Requested Prescriptions  Pending Prescriptions Disp Refills   allopurinol  (ZYLOPRIM ) 100 MG tablet 180 tablet 0    Sig: Take 2 tablets (200 mg total) by mouth daily.     Endocrinology:  Gout Agents - allopurinol  Failed - 05/26/2024 10:50 AM      Failed - Uric Acid in normal range and within 360 days    Uric Acid, Serum  Date Value Ref Range Status  05/01/2018 6.3 2.5 - 7.0 mg/dL Final    Comment:    Therapeutic target for gout patients: <6.0 mg/dL .          Failed - Cr in normal range and within 360 days    Creat  Date Value Ref Range Status  11/28/2023 1.29 (H) 0.60 - 0.95 mg/dL Final   Creatinine, Urine  Date Value Ref Range Status  07/05/2023 157 20 - 275 mg/dL Final         Passed - Valid encounter within last 12 months    Recent Outpatient Visits           1 month ago Impingement syndrome of left shoulder   [place] [facility] Pickard, Butler DASEN, MD   4 months ago Dysuria   [place] [facility] Duanne Butler DASEN, MD   5 months ago Controlled type 2 diabetes mellitus with diabetic nephropathy, without long-term current use of insulin (HCC)   [facility] [facility] Pickard, Butler DASEN, MD   10 months ago Pure hypercholesterolemia   [place] [facility] Pickard, Butler DASEN, MD   10 months ago Controlled type 2 diabetes mellitus with diabetic nephropathy, without long-term current use of insulin (HCC)    [facility] Pickard, Butler DASEN, MD              Passed - CBC within normal limits and completed in the last 12 months    WBC  Date Value Ref Range Status  11/28/2023 8.7 3.8 - 10.8 Thousand/uL Final   RBC  Date Value Ref Range Status  11/28/2023 3.96 3.80 - 5.10 Million/uL Final   Hemoglobin  Date Value Ref Range Status  11/28/2023 12.3 11.7 - 15.5 g/dL Final   HCT  Date Value Ref Range Status  11/28/2023 38.0 35.0 - 45.0 % Final   MCHC  Date Value Ref  Range Status  11/28/2023 32.4 32.0 - 36.0 g/dL Final    Comment:    For adults, a slight decrease in the calculated MCHC value (in the range of 30 to 32 g/dL) is most likely not clinically significant; however, it should be interpreted with caution in correlation with other red cell parameters and the patient's clinical condition.    [facility]  Date Value Ref Range Status  11/28/2023 31.1 27.0 - 33.0 pg Final   MCV  Date Value Ref Range Status  11/28/2023 96.0 80.0 - 100.0 fL Final   No results found for: PLTCOUNTKUC, LABPLAT, POCPLA RDW  Date Value Ref Range Status  11/28/2023 12.8 11.0 - 15.0 % Final

## 2024-06-11 ENCOUNTER — Other Ambulatory Visit: Payer: Self-pay | Admitting: Family Medicine

## 2024-06-11 DIAGNOSIS — E038 Other specified hypothyroidism: Secondary | ICD-10-CM

## 2024-06-12 NOTE — Telephone Encounter (Signed)
 Requested Prescriptions  Pending Prescriptions Disp Refills   levothyroxine  (SYNTHROID ) 88 MCG tablet [Pharmacy Med Name: Levothyroxine  Sodium 88 MCG Oral Tablet] 90 tablet 0    Sig: Take 1 tablet by mouth once daily     Endocrinology:  Hypothyroid Agents Passed - 06/12/2024  2:15 PM      Passed - TSH in normal range and within 360 days    TSH  Date Value Ref Range Status  07/05/2023 1.11 0.40 - 4.50 mIU/L Final         Passed - Valid encounter within last 12 months    Recent Outpatient Visits           2 months ago Impingement syndrome of left shoulder   [place] Pickard, Butler DASEN, MD   5 months ago Dysuria   [place] [facility] Duanne Butler DASEN, MD   6 months ago Controlled type 2 diabetes mellitus with diabetic nephropathy, without long-term current use of insulin [facility])   [place] [facility] Pickard, Butler DASEN, MD   10 months ago Pure hypercholesterolemia   [place] [facility] Duanne Butler DASEN, MD   11 months ago Controlled type 2 diabetes mellitus with diabetic nephropathy, without long-term current use of insulin [facility])   [place] [facility] Pickard, Butler DASEN, MD

## 2024-06-20 ENCOUNTER — Other Ambulatory Visit

## 2024-06-20 DIAGNOSIS — I10 Essential (primary) hypertension: Secondary | ICD-10-CM

## 2024-06-20 DIAGNOSIS — Z0000 Encounter for general adult medical examination without abnormal findings: Secondary | ICD-10-CM

## 2024-06-20 DIAGNOSIS — N1831 Chronic kidney disease, stage 3a: Secondary | ICD-10-CM

## 2024-06-20 DIAGNOSIS — E1121 Type 2 diabetes mellitus with diabetic nephropathy: Secondary | ICD-10-CM

## 2024-06-20 DIAGNOSIS — E7800 Pure hypercholesterolemia, unspecified: Secondary | ICD-10-CM

## 2024-06-20 DIAGNOSIS — E038 Other specified hypothyroidism: Secondary | ICD-10-CM

## 2024-06-21 LAB — CBC WITH DIFFERENTIAL/PLATELET
Absolute Lymphocytes: 2237 {cells}/uL (ref 850–3900)
Absolute Monocytes: 787 {cells}/uL (ref 200–950)
Basophils Absolute: 67 {cells}/uL (ref 0–200)
Basophils Relative: 0.7 %
Eosinophils Absolute: 202 {cells}/uL (ref 15–500)
Eosinophils Relative: 2.1 %
HCT: 38 % (ref 35.0–45.0)
Hemoglobin: 12.6 g/dL (ref 11.7–15.5)
MCH: 32.1 pg (ref 27.0–33.0)
MCHC: 33.2 g/dL (ref 32.0–36.0)
MCV: 96.9 fL (ref 80.0–100.0)
MPV: 10.1 fL (ref 7.5–12.5)
Monocytes Relative: 8.2 %
Neutro Abs: 6307 {cells}/uL (ref 1500–7800)
Neutrophils Relative %: 65.7 %
Platelets: 277 Thousand/uL (ref 140–400)
RBC: 3.92 Million/uL (ref 3.80–5.10)
RDW: 12.8 % (ref 11.0–15.0)
Total Lymphocyte: 23.3 %
WBC: 9.6 Thousand/uL (ref 3.8–10.8)

## 2024-06-21 LAB — COMPLETE METABOLIC PANEL WITHOUT GFR
AG Ratio: 1.8 (calc) (ref 1.0–2.5)
ALT: 19 U/L (ref 6–29)
AST: 15 U/L (ref 10–35)
Albumin: 4.4 g/dL (ref 3.6–5.1)
Alkaline phosphatase (APISO): 63 U/L (ref 37–153)
BUN/Creatinine Ratio: 16 (calc) (ref 6–22)
BUN: 18 mg/dL (ref 7–25)
CO2: 27 mmol/L (ref 20–32)
Calcium: 9.9 mg/dL (ref 8.6–10.4)
Chloride: 104 mmol/L (ref 98–110)
Creat: 1.14 mg/dL — ABNORMAL HIGH (ref 0.60–0.95)
Globulin: 2.5 g/dL (ref 1.9–3.7)
Glucose, Bld: 128 mg/dL — ABNORMAL HIGH (ref 65–99)
Potassium: 4.5 mmol/L (ref 3.5–5.3)
Sodium: 141 mmol/L (ref 135–146)
Total Bilirubin: 0.3 mg/dL (ref 0.2–1.2)
Total Protein: 6.9 g/dL (ref 6.1–8.1)

## 2024-06-21 LAB — MICROALBUMIN / CREATININE URINE RATIO
Creatinine, Urine: 223 mg/dL (ref 20–275)
Microalb Creat Ratio: 24 mg/g{creat} (ref <30)
Microalb, Ur: 5.4 mg/dL

## 2024-06-21 LAB — HEMOGLOBIN A1C
Hgb A1c MFr Bld: 6.8 % — ABNORMAL HIGH (ref <5.7)
Mean Plasma Glucose: 148 mg/dL
eAG (mmol/L): 8.2 mmol/L

## 2024-06-21 LAB — LIPID PANEL
Cholesterol: 99 mg/dL (ref <200)
HDL: 40 mg/dL — ABNORMAL LOW (ref >=50)
LDL Cholesterol (Calc): 40 mg/dL
Non-HDL Cholesterol (Calc): 59 mg/dL (ref <130)
Total CHOL/HDL Ratio: 2.5 (calc) (ref <5.0)
Triglycerides: 111 mg/dL (ref <150)

## 2024-06-21 LAB — VITAMIN B12: Vitamin B-12: 275 pg/mL (ref 200–1100)

## 2024-06-24 ENCOUNTER — Ambulatory Visit: Payer: Self-pay | Admitting: Family Medicine

## 2024-06-26 ENCOUNTER — Encounter: Payer: Self-pay | Admitting: Family Medicine

## 2024-06-26 ENCOUNTER — Ambulatory Visit: Admitting: Family Medicine

## 2024-06-26 VITALS — BP 136/72 | HR 68 | Temp 98.2°F | Ht 63.0 in | Wt 207.4 lb

## 2024-06-26 DIAGNOSIS — E1121 Type 2 diabetes mellitus with diabetic nephropathy: Secondary | ICD-10-CM

## 2024-06-26 NOTE — Progress Notes (Signed)
 Wt Readings from Last 3 Encounters:  06/26/24 207 lb 6.4 oz (94.1 kg)  04/10/24 202 lb 2 oz (91.7 kg)  01/01/24 192 lb (87.1 kg)     Subjective:    Patient ID: Whitney Morales Fell, female    DOB: [birth year], 83 y.o.   MRN: [id number]  Patient is a very pleasant 83 year-old Caucasian female here today for follow-up of her chronic medical problems.  Her blood pressure today is well-controlled.  She denies any chest pain shortness of breath or dyspnea on exertion.  She is steadily gaining weight despite being on ozempic  2 mg sq weekly.  Denies any nausea or abdominal pain.  Most recent bood work below.  Lab on 06/20/2024  Component Date Value Ref Range Status   WBC 06/20/2024 9.6  3.8 - 10.8 Thousand/uL Final   RBC 06/20/2024 3.92  3.80 - 5.10 Million/uL Final   Hemoglobin 06/20/2024 12.6  11.7 - 15.5 g/dL Final   HCT 91/70/7974 38.0  35.0 - 45.0 % Final   MCV 06/20/2024 96.9  80.0 - 100.0 fL Final   MCH 06/20/2024 32.1  27.0 - 33.0 pg Final   MCHC 06/20/2024 33.2  32.0 - 36.0 g/dL Final   Comment: For adults, a slight decrease in the calculated MCHC value (in the range of 30 to 32 g/dL) is most likely not clinically significant; however, it should be interpreted with caution in correlation with other red cell parameters and the patient's clinical condition.    RDW 06/20/2024 12.8  11.0 - 15.0 % Final   Platelets 06/20/2024 277  140 - 400 Thousand/uL Final   MPV 06/20/2024 10.1  7.5 - 12.5 fL Final   Neutro Abs 06/20/2024 6,307  1,500 - 7,800 cells/uL Final   Absolute Lymphocytes 06/20/2024 2,237  850 - 3,900 cells/uL Final   Absolute Monocytes 06/20/2024 787  200 - 950 cells/uL Final   Eosinophils Absolute 06/20/2024 202  15 - 500 cells/uL Final   Basophils Absolute 06/20/2024 67  0 - 200 cells/uL Final   Neutrophils Relative % 06/20/2024 65.7  % Final   Total Lymphocyte 06/20/2024 23.3  % Final   Monocytes Relative 06/20/2024 8.2  % Final   Eosinophils Relative 06/20/2024 2.1  %  Final   Basophils Relative 06/20/2024 0.7  % Final   Glucose, Bld 06/20/2024 128 (H)  65 - 99 mg/dL Final   Comment: .            Fasting reference interval . For someone without known diabetes, a glucose value >125 mg/dL indicates that they may have diabetes and this should be confirmed with a follow-up test. .    BUN 06/20/2024 18  7 - 25 mg/dL Final   Creat 91/70/7974 1.14 (H)  0.60 - 0.95 mg/dL Final   BUN/Creatinine Ratio 06/20/2024 16  6 - 22 (calc) Final   Sodium 06/20/2024 141  135 - 146 mmol/L Final   Potassium 06/20/2024 4.5  3.5 - 5.3 mmol/L Final   Chloride 06/20/2024 104  98 - 110 mmol/L Final   CO2 06/20/2024 27  20 - 32 mmol/L Final   Calcium  06/20/2024 9.9  8.6 - 10.4 mg/dL Final   Total Protein 91/70/7974 6.9  6.1 - 8.1 g/dL Final   Albumin 91/70/7974 4.4  3.6 - 5.1 g/dL Final   Globulin 91/70/7974 2.5  1.9 - 3.7 g/dL (calc) Final   AG Ratio 06/20/2024 1.8  1.0 - 2.5 (calc) Final   Total Bilirubin 06/20/2024 0.3  0.2 - 1.2  mg/dL Final   Alkaline phosphatase (APISO) 06/20/2024 63  37 - 153 U/L Final   AST 06/20/2024 15  10 - 35 U/L Final   ALT 06/20/2024 19  6 - 29 U/L Final   Hgb A1c MFr Bld 06/20/2024 6.8 (H)  <5.7 % Final   Comment: For someone without known diabetes, a hemoglobin A1c value of 6.5% or greater indicates that they may have  diabetes and this should be confirmed with a follow-up  test. . For someone with known diabetes, a value <7% indicates  that their diabetes is well controlled and a value  greater than or equal to 7% indicates suboptimal  control. A1c targets should be individualized based on  duration of diabetes, age, comorbid conditions, and  other considerations. . Currently, no consensus exists regarding use of hemoglobin A1c for diagnosis of diabetes for children. .    Mean Plasma Glucose 06/20/2024 148  mg/dL Final   eAG (mmol/L) 91/70/7974 8.2  mmol/L Final   Cholesterol 06/20/2024 99  <200 mg/dL Final   HDL 91/70/7974 40  (L)  > OR = 50 mg/dL Final   Triglycerides 91/70/7974 111  <150 mg/dL Final   LDL Cholesterol (Calc) 06/20/2024 40  mg/dL (calc) Final   Comment: Reference range: <100 . Desirable range <100 mg/dL for primary prevention;   <70 mg/dL for patients with CHD or diabetic patients  with > or = 2 CHD risk factors. SABRA LDL-C is now calculated using the Martin-Hopkins  calculation, which is a validated novel method providing  better accuracy than the Friedewald equation in the  estimation of LDL-C.  Gladis APPLETHWAITE et al. SANDREA. 7986;689(80): 2061-2068  (http://education.[facility].com/faq/FAQ164)    Total CHOL/HDL Ratio 06/20/2024 2.5  <4.9 (calc) Final   Non-HDL Cholesterol (Calc) 06/20/2024 59  <130 mg/dL (calc) Final   Comment: For patients with diabetes plus 1 major ASCVD risk  factor, treating to a non-HDL-C goal of <100 mg/dL  (LDL-C of <29 mg/dL) is considered a therapeutic  option.    Creatinine, Urine 06/20/2024 223  20 - 275 mg/dL Final   Microalb, Ur 91/70/7974 5.4  mg/dL Final   Comment: Reference Range Not established    Microalb Creat Ratio 06/20/2024 24  <30 mg/g creat Final   Comment: . The ADA defines abnormalities in albumin excretion as follows: SABRA Albuminuria Category        Result (mg/g creatinine) . Normal to Mildly increased   <30 Moderately increased         30-299  Severely increased           > OR = 300 . The ADA recommends that at least two of three specimens collected within a 3-6 month period be abnormal before considering a patient to be within a diagnostic category.    Vitamin B-12 06/20/2024 275  200 - 1,100 pg/mL Final   Comment: . Please Note: Although the reference range for vitamin B12 is [phone] pg/mL, it has been reported that between 5 and 10% of patients with values between 200 and 400 pg/mL may experience neuropsychiatric and hematologic abnormalities due to occult B12 deficiency; less than 1% of patients with values above 400 pg/mL  will have symptoms. .     Past Medical History:  Diagnosis Date   Abnormal finding on EKG 10/29/2012   FOLLOW UP NUCLEAR STRESS TEST ON 11/05/12 -NORMAL   Corneal erosion 2012   RIGHT EYE--HAS RESOLVED--NO PROBLEMS SINCE   Diabetes mellitus    Diverticulitis    Fatigue  GERD (gastroesophageal reflux disease)    PAST HX--NO PROBLEMS NOW-PT DOES TAKE DAILY TUMS AT BEDTIME   Goiter    cyst vs goiter on thyroid    H/O hiatal hernia    High cholesterol    Hypertension    Obesity    Osteopenia    Past Surgical History:  Procedure Laterality Date   ABDOMINAL HYSTERECTOMY     BREAST LUMPECTOMY     BENIGN   CARDIOVASCULAR STRESS TEST  06/08/2009   EF 78%, NO ISCHEMIA   THYROID  LOBECTOMY  11/27/2012   Procedure: THYROID  LOBECTOMY;  Surgeon: Lynda Leos, MD;  Location: [facility];  Service: General;  Laterality: Right;  Right Thyroid  Lobectomy   TONSILLECTOMY     US  ECHOCARDIOGRAPHY  03/23/2006   EF 55-60%   Current Outpatient Medications on File Prior to Visit  Medication Sig Dispense Refill   Accu-Chek Softclix Lancets lancets USE AS DIRECTED TO CHECK SUGAR 100 each 3   allopurinol  (ZYLOPRIM ) 100 MG tablet Take 2 tablets (200 mg total) by mouth daily. 180 tablet 0   amLODipine  (NORVASC ) 10 MG tablet TAKE 1 TABLET BY MOUTH EVERY DAY 90 tablet 3   Blood Glucose Monitoring Suppl (ACCU-CHEK AVIVA PLUS) w/Device KIT Use to check sugars as needed 1 kit 0   cetirizine  (ZYRTEC ) 10 MG tablet Take 1 tablet (10 mg total) by mouth daily. 30 tablet 11   fluticasone  (FLONASE ) 50 MCG/ACT nasal spray Place 2 sprays into both nostrils daily. 16 g 6   furosemide  (LASIX ) 20 MG tablet TAKE 1 TABLET BY MOUTH EVERY DAY 90 tablet 0   glucose blood (ACCU-CHEK AVIVA PLUS) test strip CHECK BLOOD SUGAR TWICE DAILY FASTING IN MORNING AND THEN 2 HOURS AFTER A MEAL E11.65O 100 strip 3   levothyroxine  (SYNTHROID ) 88 MCG tablet Take 1 tablet by mouth once daily 90 tablet 0   lisinopril  (ZESTRIL ) 20 MG tablet Take 1  tablet (20 mg total) by mouth 2 (two) times daily. 180 tablet 1   Multiple Vitamin (MULTIVITAMIN WITH MINERALS) TABS Take 1 tablet by mouth daily.     Nebivolol  HCl 20 MG TABS Take 1 tablet (20 mg total) by mouth daily. 90 tablet 3   rosuvastatin  (CRESTOR ) 10 MG tablet TAKE 1 TABLET BY MOUTH EVERY DAY 90 tablet 1   Semaglutide , 2 MG/DOSE, 8 MG/3ML SOPN Inject 2 mg as directed once a week. 3 mL 11   vitamin E 180 MG (400 UNITS) capsule Take 400 Units by mouth daily.     No current facility-administered medications on file prior to visit.   Allergies  Allergen Reactions   Clindamycin/Lincomycin     CLINDAMYCIN TAKEN WITH CIPRO CAUSED SEVERE NAUSEA--PT STATES SHE CAN TAKE CIPRO ALONE WITHOUT PROBLEM--IT WAS JUST THE COMBINATION SHE DID NOT TOLERATE.   Levaquin [Levofloxacin] Nausea And Vomiting   Metronidazole Hcl Other (See Comments)    Deathly sick WHEN PT TOOK METRONIDAZOLE WITH CIPRO.  PT STATES SHE HAS TAKEN CIPRO ALONE WITHOUT PROBLEM --JUST DID NOT TOLERATE THE COMBINATION OF BOTH DRUGS TAKEN TOGETHER.   Social History   Socioeconomic History   Marital status: Married    Spouse name: Christopher   Number of children: 2   Years of education: Not on file   Highest education level: Not on file  Occupational History   Not on file  Tobacco Use   Smoking status: Never   Smokeless tobacco: Never  Substance and Sexual Activity   Alcohol use: No   Drug use: No  Sexual activity: Not Currently  Other Topics Concern   Not on file  Social History Narrative   2 sons   Deirdre and great grandchildren   Social Drivers of Health   Financial Resource Strain: Low Risk  (01/18/2023)   Overall Financial Resource Strain (CARDIA)    Difficulty of Paying Living Expenses: Not hard at all  Food Insecurity: No Food Insecurity (01/18/2023)   Hunger Vital Sign    Worried About Running Out of Food in the Last Year: Never true    Ran Out of Food in the Last Year: Never true  Transportation  Needs: No Transportation Needs (01/18/2023)   PRAPARE - Administrator, Civil Service (Medical): No    Lack of Transportation (Non-Medical): No  Physical Activity: Sufficiently Active (01/18/2023)   Exercise Vital Sign    Days of Exercise per Week: 5 days    Minutes of Exercise per Session: 30 min  Stress: No Stress Concern Present (01/18/2023)   [employer] of Occupational Health - Occupational Stress Questionnaire    Feeling of Stress : Not at all  Social Connections: Moderately Integrated (01/18/2023)   Social Connection and Isolation Panel    Frequency of Communication with Friends and Family: More than three times a week    Frequency of Social Gatherings with Friends and Family: More than three times a week    Attends Religious Services: 1 to 4 times per year    Active Member of [employer] or Organizations: No    Attends Banker Meetings: Never    Marital Status: Married  Catering manager Violence: Not At Risk (01/18/2023)   Humiliation, Afraid, Rape, and Kick questionnaire    Fear of Current or Ex-Partner: No    Emotionally Abused: No    Physically Abused: No    Sexually Abused: No   No family history on file.     Review of Systems  All other systems reviewed and are negative.      Objective:   Physical Exam Vitals reviewed.  Constitutional:      General: She is not in acute distress.    Appearance: Normal appearance. She is well-developed and normal weight. She is not ill-appearing, toxic-appearing or diaphoretic.  HENT:     Right Ear: Tympanic membrane, ear canal and external ear normal.     Left Ear: Tympanic membrane, ear canal and external ear normal.     Nose: Nose normal.     Mouth/Throat:     Mouth: Mucous membranes are moist.     Pharynx: Oropharynx is clear. No oropharyngeal exudate or posterior oropharyngeal erythema.  Eyes:     Extraocular Movements: Extraocular movements intact.     Conjunctiva/sclera: Conjunctivae normal.      Pupils: Pupils are equal, round, and reactive to light.  Neck:     Thyroid : No thyromegaly.     Vascular: No carotid bruit or JVD.  Cardiovascular:     Rate and Rhythm: Normal rate and regular rhythm.     Pulses: Normal pulses.     Heart sounds: Normal heart sounds. No murmur heard.    No friction rub. No gallop.  Pulmonary:     Effort: Pulmonary effort is normal. No respiratory distress.     Breath sounds: Normal breath sounds. No stridor. No wheezing, rhonchi or rales.  Chest:     Chest wall: No tenderness.  Abdominal:     General: Bowel sounds are normal. There is no distension.     Palpations:  Abdomen is soft. There is no mass.     Tenderness: There is no abdominal tenderness. There is no guarding or rebound.  Musculoskeletal:     Cervical back: Normal range of motion and neck supple. No rigidity.     Right lower leg: No edema.     Left lower leg: No edema.  Lymphadenopathy:     Cervical: No cervical adenopathy.  Skin:    Coloration: Skin is not jaundiced or pale.     Findings: No bruising, erythema, lesion or rash.  Neurological:     General: No focal deficit present.     Mental Status: She is alert and oriented to person, place, and time. Mental status is at baseline.     Cranial Nerves: No cranial nerve deficit.     Motor: No weakness or abnormal muscle tone.     Coordination: Coordination normal.     Gait: Gait normal.     Deep Tendon Reflexes: Reflexes normal.  Psychiatric:        Mood and Affect: Mood normal.        Behavior: Behavior normal.        Thought Content: Thought content normal.        Judgment: Judgment normal.           Assessment & Plan:  Controlled type 2 diabetes mellitus with diabetic nephropathy, without long-term current use of insulin (HCC) A1c has risen to 6.8 and weight is up as well.  Discussed switching ozempic  to mounjaro to facilitate weight loss and improve her A1c.  Cholesterol is acceptable.  Patient chooses to continue ozempic   for now.  She will let me know if she cooses to switch to mounjaro in future.  Renal fcn is stable and cholesterol is excellent. Continue lasix  20 mg poqday for now.  No significant edema on exam today.

## 2024-06-28 ENCOUNTER — Encounter: Payer: Self-pay | Admitting: Family Medicine

## 2024-06-30 ENCOUNTER — Other Ambulatory Visit: Payer: Self-pay

## 2024-06-30 MED ORDER — TIRZEPATIDE 2.5 MG/0.5ML ~~LOC~~ SOAJ: 2.5000 mg | SUBCUTANEOUS | 1 refills | Status: DC

## 2024-07-01 ENCOUNTER — Other Ambulatory Visit: Payer: Self-pay | Admitting: Family Medicine

## 2024-07-01 DIAGNOSIS — I10 Essential (primary) hypertension: Secondary | ICD-10-CM

## 2024-07-02 ENCOUNTER — Other Ambulatory Visit: Payer: Self-pay

## 2024-07-02 ENCOUNTER — Telehealth: Payer: Self-pay | Admitting: Family Medicine

## 2024-07-02 DIAGNOSIS — N1831 Chronic kidney disease, stage 3a: Secondary | ICD-10-CM

## 2024-07-02 MED ORDER — FUROSEMIDE 20 MG PO TABS: 20.0000 mg | ORAL_TABLET | Freq: Every day | ORAL | 0 refills | Status: DC

## 2024-07-02 MED ORDER — ALLOPURINOL 100 MG PO TABS: 200.0000 mg | ORAL_TABLET | Freq: Every day | ORAL | 0 refills | Status: DC

## 2024-07-02 NOTE — Telephone Encounter (Signed)
 Med sent in.

## 2024-07-02 NOTE — Telephone Encounter (Signed)
 Prescription Request  07/02/2024  LOV: 06/26/2024  What is the name of the medication or equipment?   allopurinol  (ZYLOPRIM ) 100 MG tablet   furosemide  (LASIX ) 20 MG tablet  **90 day script requested**  Have you contacted your pharmacy to request a refill? Yes   Which pharmacy would you like this sent to?  [id number] [place], [place] - [address] AT [facility] [address] [place] [address] Phone: [phone] Fax: [phone]    Patient notified that their request is being sent to the clinical staff for review and that they should receive a response within 2 business days.   Please advise pharmacist.

## 2024-07-30 ENCOUNTER — Telehealth: Payer: Self-pay

## 2024-07-30 NOTE — Telephone Encounter (Signed)
 Copied from CRM [id number]. Topic: Clinical - Medication Question >> Jul 30, 2024 10:28 AM Whitney Morales wrote: Reason for CRM: tirzepatide  (MOUNJARO ) 2.5 MG/0.5ML Pen ( sugar has increased - 120 ( usually it is around 100 )  should she get an increase?  also has gained a few pounds  Should she be seen or guidance for it?  Will be at home until 4pm

## 2024-08-01 ENCOUNTER — Other Ambulatory Visit: Payer: Self-pay | Admitting: Family Medicine

## 2024-08-01 MED ORDER — TIRZEPATIDE 5 MG/0.5ML ~~LOC~~ SOAJ: 5.0000 mg | SUBCUTANEOUS | 2 refills | Status: DC

## 2024-08-04 ENCOUNTER — Other Ambulatory Visit: Payer: Self-pay | Admitting: Family Medicine

## 2024-08-04 DIAGNOSIS — N1831 Chronic kidney disease, stage 3a: Secondary | ICD-10-CM

## 2024-08-05 ENCOUNTER — Other Ambulatory Visit: Payer: Self-pay

## 2024-08-05 ENCOUNTER — Telehealth: Payer: Self-pay

## 2024-08-05 DIAGNOSIS — N1831 Chronic kidney disease, stage 3a: Secondary | ICD-10-CM

## 2024-08-05 DIAGNOSIS — I10 Essential (primary) hypertension: Secondary | ICD-10-CM

## 2024-08-05 MED ORDER — AMLODIPINE BESYLATE 10 MG PO TABS: 10.0000 mg | ORAL_TABLET | Freq: Every day | ORAL | 3 refills | Status: AC

## 2024-08-05 NOTE — Telephone Encounter (Signed)
 Sent in medication

## 2024-08-05 NOTE — Telephone Encounter (Signed)
 Prescription Request  08/05/2024  LOV: 06/26/24  What is the name of the medication or equipment? amLODipine  (NORVASC ) 10 MG tablet [[id number]]   Have you contacted your pharmacy to request a refill? Yes   Which pharmacy would you like this sent to?  [id number] [place], [place] - [address] AT [facility] [address] [place] [address] Phone: [phone] Fax: [phone]    Patient notified that their request is being sent to the clinical staff for review and that they should receive a response within 2 business days.   Please advise at [facility] [phone]

## 2024-08-25 ENCOUNTER — Ambulatory Visit: Payer: Self-pay

## 2024-08-25 NOTE — Telephone Encounter (Signed)
 FYI Only or Action Required?: FYI only for provider: appointment scheduled on 08/26/2024.  Patient was last seen in primary care on 06/26/2024 by Duanne Butler DASEN, MD.  Called Nurse Triage reporting Foot Swelling.  Symptoms began a week ago.  Interventions attempted: Prescription medications: furosemide  and Rest, hydration, or home remedies.  Symptoms are: unchanged.  Triage Disposition: See PCP When Office is Open (Within 3 Days)  Patient/caregiver understands and will follow disposition?: Yes  Copied from CRM #[id number]. Topic: Clinical - Red Word Triage >> Aug 25, 2024  9:41 AM Rosaria BRAVO wrote: Red Word that prompted transfer to Nurse Triage: Swelling in feet, possible med reaction, also very tired.    ----------------------------------------------------------------------- From previous Reason for Contact - Scheduling: Patient/patient representative is calling to schedule an appointment. Refer to attachments for appointment information. Reason for Disposition  [1] MILD swelling of both ankles (i.e., pedal edema) AND [2] new-onset or getting worse  Answer Assessment - Initial Assessment Questions 1. ONSET: When did the swelling start? (e.g., minutes, hours, days)     One week ago 2. LOCATION: What part of the leg is swollen?  Are both legs swollen or just one leg?     Both feet, left worse than the other with swelling extending into ankle  4. REDNESS: Is there redness or signs of infection?     denies 5. PAIN: Is the swelling painful to touch? If Yes, ask: How painful is it?   (Scale 1-10; mild, moderate or severe)     Tight, uncomfortable 6. FEVER: Do you have a fever? If Yes, ask: What is it, how was it measured, and when did it start?      denies 7. CAUSE: What do you think is causing the leg swelling?     Has been at the beach, and has also switched to mounjaro  8. MEDICAL HISTORY: Do you have a history of blood clots (e.g., DVT), cancer, heart failure,  kidney disease, or liver failure?     htn 9. RECURRENT SYMPTOM: Have you had leg swelling before? If Yes, ask: When was the last time? What happened that time?     Yes, has had swelling in the past, takes furosemide  10. OTHER SYMPTOMS: Do you have any other symptoms? (e.g., chest pain, difficulty breathing)       More tired than normal  Protocols used: Leg Swelling and Edema-A-AH

## 2024-08-26 ENCOUNTER — Ambulatory Visit: Admitting: Family Medicine

## 2024-08-26 ENCOUNTER — Encounter: Payer: Self-pay | Admitting: Family Medicine

## 2024-08-26 VITALS — BP 132/64 | HR 71 | Temp 98.2°F | Ht 63.0 in | Wt 211.4 lb

## 2024-08-26 DIAGNOSIS — M7989 Other specified soft tissue disorders: Secondary | ICD-10-CM

## 2024-08-26 LAB — BASIC METABOLIC PANEL WITHOUT GFR
BUN/Creatinine Ratio: 16 (calc) (ref 6–22)
BUN: 22 mg/dL (ref 7–25)
CO2: 28 mmol/L (ref 20–32)
Calcium: 9.9 mg/dL (ref 8.6–10.4)
Chloride: 102 mmol/L (ref 98–110)
Creat: 1.38 mg/dL — ABNORMAL HIGH (ref 0.60–0.95)
Glucose, Bld: 116 mg/dL — ABNORMAL HIGH (ref 65–99)
Potassium: 5 mmol/L (ref 3.5–5.3)
Sodium: 139 mmol/L (ref 135–146)

## 2024-08-26 MED ORDER — FUROSEMIDE 40 MG PO TABS: 40.0000 mg | ORAL_TABLET | Freq: Every day | ORAL | 3 refills | Status: AC

## 2024-08-26 NOTE — Progress Notes (Signed)
 Wt Readings from Last 3 Encounters:  08/26/24 211 lb 6.4 oz (95.9 kg)  06/26/24 207 lb 6.4 oz (94.1 kg)  04/10/24 202 lb 2 oz (91.7 kg)     Subjective:    Patient ID: Whitney Morales, female    DOB: [birth year], 83 y.o.   MRN: [id number]  Patient is concerned today because she has swelling in both legs.  She has trace to +1 pitting edema in her feet to her mid shin.  She has a history of venous insufficiency.  We have discussed this in the past.  She had an echocardiogram in 2022 that showed a normal ejection fraction.  She denies any shortness of breath or chest pain although she has noticed 4 pounds of weight gain associated with the swelling.  She is currently on Lasix  40 mg a day however she is also on amlodipine  due to her difficult to control blood pressure   Past Medical History:  Diagnosis Date   Abnormal finding on EKG 10/29/2012   FOLLOW UP NUCLEAR STRESS TEST ON 11/05/12 -NORMAL   Corneal erosion 2012   RIGHT EYE--HAS RESOLVED--NO PROBLEMS SINCE   Diabetes mellitus    Diverticulitis    Fatigue    GERD (gastroesophageal reflux disease)    PAST HX--NO PROBLEMS NOW-PT DOES TAKE DAILY TUMS AT BEDTIME   Goiter    cyst vs goiter on thyroid    H/O hiatal hernia    High cholesterol    Hypertension    Obesity    Osteopenia    Past Surgical History:  Procedure Laterality Date   ABDOMINAL HYSTERECTOMY     BREAST LUMPECTOMY     BENIGN   CARDIOVASCULAR STRESS TEST  06/08/2009   EF 78%, NO ISCHEMIA   THYROID  LOBECTOMY  11/27/2012   Procedure: THYROID  LOBECTOMY;  Surgeon: Lynda Leos, MD;  Location: [facility];  Service: General;  Laterality: Right;  Right Thyroid  Lobectomy   TONSILLECTOMY     US  ECHOCARDIOGRAPHY  03/23/2006   EF 55-60%   Current Outpatient Medications on File Prior to Visit  Medication Sig Dispense Refill   Accu-Chek Softclix Lancets lancets USE AS DIRECTED TO CHECK SUGAR 100 each 3   allopurinol  (ZYLOPRIM ) 100 MG tablet Take 2 tablets (200 mg total) by mouth  daily. 180 tablet 0   amLODipine  (NORVASC ) 10 MG tablet Take 1 tablet (10 mg total) by mouth daily. 90 tablet 3   Blood Glucose Monitoring Suppl (ACCU-CHEK AVIVA PLUS) w/Device KIT Use to check sugars as needed 1 kit 0   cetirizine  (ZYRTEC ) 10 MG tablet Take 1 tablet (10 mg total) by mouth daily. 30 tablet 11   fluticasone  (FLONASE ) 50 MCG/ACT nasal spray Place 2 sprays into both nostrils daily. 16 g 6   furosemide  (LASIX ) 20 MG tablet TAKE 1 TABLET BY MOUTH EVERY DAY 90 tablet 0   glucose blood (ACCU-CHEK AVIVA PLUS) test strip CHECK BLOOD SUGAR TWICE DAILY FASTING IN MORNING AND THEN 2 HOURS AFTER A MEAL E11.65O 100 strip 3   levothyroxine  (SYNTHROID ) 88 MCG tablet Take 1 tablet by mouth once daily 90 tablet 0   lisinopril  (ZESTRIL ) 20 MG tablet TAKE 1 TABLET BY MOUTH TWICE A DAY 180 tablet 1   Multiple Vitamin (MULTIVITAMIN WITH MINERALS) TABS Take 1 tablet by mouth daily.     Nebivolol  HCl 20 MG TABS TAKE 1 TABLET BY MOUTH EVERY DAY 90 tablet 3   rosuvastatin  (CRESTOR ) 10 MG tablet TAKE 1 TABLET BY MOUTH EVERY DAY 90 tablet 1  tirzepatide  (MOUNJARO ) 5 MG/0.5ML Pen Inject 5 mg into the skin once a week. 6 mL 2   vitamin E 180 MG (400 UNITS) capsule Take 400 Units by mouth daily.     No current facility-administered medications on file prior to visit.   Allergies  Allergen Reactions   Clindamycin/Lincomycin     CLINDAMYCIN TAKEN WITH CIPRO CAUSED SEVERE NAUSEA--PT STATES SHE CAN TAKE CIPRO ALONE WITHOUT PROBLEM--IT WAS JUST THE COMBINATION SHE DID NOT TOLERATE.   Levaquin [Levofloxacin] Nausea And Vomiting   Metronidazole Hcl Other (See Comments)    Deathly sick WHEN PT TOOK METRONIDAZOLE WITH CIPRO.  PT STATES SHE HAS TAKEN CIPRO ALONE WITHOUT PROBLEM --JUST DID NOT TOLERATE THE COMBINATION OF BOTH DRUGS TAKEN TOGETHER.   Social History   Socioeconomic History   Marital status: Married    Spouse name: Christopher   Number of children: 2   Years of education: Not on file   Highest  education level: Not on file  Occupational History   Not on file  Tobacco Use   Smoking status: Never   Smokeless tobacco: Never  Substance and Sexual Activity   Alcohol use: No   Drug use: No   Sexual activity: Not Currently  Other Topics Concern   Not on file  Social History Narrative   2 sons   Deirdre and great grandchildren   Social Drivers of Corporate Investment Banker Strain: Low Risk  (01/18/2023)   Overall Financial Resource Strain (CARDIA)    Difficulty of Paying Living Expenses: Not hard at all  Food Insecurity: No Food Insecurity (01/18/2023)   Hunger Vital Sign    Worried About Running Out of Food in the Last Year: Never true    Ran Out of Food in the Last Year: Never true  Transportation Needs: No Transportation Needs (01/18/2023)   PRAPARE - Administrator, Civil Service (Medical): No    Lack of Transportation (Non-Medical): No  Physical Activity: Sufficiently Active (01/18/2023)   Exercise Vital Sign    Days of Exercise per Week: 5 days    Minutes of Exercise per Session: 30 min  Stress: No Stress Concern Present (01/18/2023)   [employer] of Occupational Health - Occupational Stress Questionnaire    Feeling of Stress : Not at all  Social Connections: Moderately Integrated (01/18/2023)   Social Connection and Isolation Panel    Frequency of Communication with Friends and Family: More than three times a week    Frequency of Social Gatherings with Friends and Family: More than three times a week    Attends Religious Services: 1 to 4 times per year    Active Member of [employer] or Organizations: No    Attends Banker Meetings: Never    Marital Status: Married  Catering Manager Violence: Not At Risk (01/18/2023)   Humiliation, Afraid, Rape, and Kick questionnaire    Fear of Current or Ex-Partner: No    Emotionally Abused: No    Physically Abused: No    Sexually Abused: No   No family history on file.     Review of Systems   All other systems reviewed and are negative.      Objective:   Physical Exam Vitals reviewed.  Constitutional:      General: She is not in acute distress.    Appearance: Normal appearance. She is well-developed and normal weight. She is not ill-appearing, toxic-appearing or diaphoretic.  HENT:     Right Ear: Tympanic membrane, ear  canal and external ear normal.     Left Ear: Tympanic membrane, ear canal and external ear normal.     Nose: Nose normal.     Mouth/Throat:     Mouth: Mucous membranes are moist.     Pharynx: Oropharynx is clear. No oropharyngeal exudate or posterior oropharyngeal erythema.  Eyes:     Extraocular Movements: Extraocular movements intact.     Conjunctiva/sclera: Conjunctivae normal.     Pupils: Pupils are equal, round, and reactive to light.  Neck:     Thyroid : No thyromegaly.     Vascular: No carotid bruit or JVD.  Cardiovascular:     Rate and Rhythm: Normal rate and regular rhythm.     Pulses: Normal pulses.     Heart sounds: Normal heart sounds. No murmur heard.    No friction rub. No gallop.  Pulmonary:     Effort: Pulmonary effort is normal. No respiratory distress.     Breath sounds: Normal breath sounds. No stridor. No wheezing, rhonchi or rales.  Chest:     Chest wall: No tenderness.  Abdominal:     General: Bowel sounds are normal. There is no distension.     Palpations: Abdomen is soft. There is no mass.     Tenderness: There is no abdominal tenderness. There is no guarding or rebound.  Musculoskeletal:     Cervical back: Normal range of motion and neck supple. No rigidity.     Right lower leg: Edema present.     Left lower leg: Edema present.  Lymphadenopathy:     Cervical: No cervical adenopathy.  Skin:    Coloration: Skin is not jaundiced or pale.     Findings: No bruising, erythema, lesion or rash.  Neurological:     General: No focal deficit present.     Mental Status: She is alert and oriented to person, place, and time.  Mental status is at baseline.     Cranial Nerves: No cranial nerve deficit.     Motor: No weakness or abnormal muscle tone.     Coordination: Coordination normal.     Gait: Gait normal.     Deep Tendon Reflexes: Reflexes normal.  Psychiatric:        Mood and Affect: Mood normal.        Behavior: Behavior normal.        Thought Content: Thought content normal.        Judgment: Judgment normal.           Assessment & Plan:  Leg swelling - Plan: Brain natriuretic peptide, Basic Metabolic Panel Without GFR I believe the leg swelling is due to chronic venous insufficiency exacerbated by amlodipine .  Increase Lasix  to 40 mg a day.  Continue amlodipine  due to her difficult to control blood pressure even though this likely makes it worse.  I explained this to her.  Encouraged her to wear compression hose.  Check BNP.  If BNP is elevated I would repeat echocardiogram

## 2024-08-27 LAB — BRAIN NATRIURETIC PEPTIDE: Brain Natriuretic Peptide: 86 pg/mL (ref <100)

## 2024-08-28 ENCOUNTER — Ambulatory Visit: Payer: Self-pay | Admitting: Family Medicine

## 2024-09-07 ENCOUNTER — Other Ambulatory Visit: Payer: Self-pay | Admitting: Family Medicine

## 2024-09-07 DIAGNOSIS — E038 Other specified hypothyroidism: Secondary | ICD-10-CM

## 2024-09-09 NOTE — Telephone Encounter (Signed)
 Requested Prescriptions  Pending Prescriptions Disp Refills   levothyroxine  (SYNTHROID ) 88 MCG tablet [Pharmacy Med Name: Levothyroxine  Sodium 88 MCG Oral Tablet] 90 tablet 0    Sig: Take 1 tablet by mouth once daily     Endocrinology:  Hypothyroid Agents Failed - 09/09/2024  3:56 PM      Failed - TSH in normal range and within 360 days    TSH  Date Value Ref Range Status  07/05/2023 1.11 0.40 - 4.50 mIU/L Final         Passed - Valid encounter within last 12 months    Recent Outpatient Visits           2 weeks ago Leg swelling   [facility] Duanne Butler DASEN, MD   2 months ago Controlled type 2 diabetes mellitus with diabetic nephropathy, without long-term current use of insulin [facility])   [facility] Duanne Butler DASEN, MD   5 months ago Impingement syndrome of left shoulder   [place] Duanne, Butler DASEN, MD   8 months ago Dysuria   [facility] Duanne Butler DASEN, MD   9 months ago Controlled type 2 diabetes mellitus with diabetic nephropathy, without long-term current use of insulin [facility])   [facility] [place], Butler DASEN, MD

## 2024-10-01 DIAGNOSIS — I872 Venous insufficiency (chronic) (peripheral): Secondary | ICD-10-CM | POA: Insufficient documentation

## 2024-10-27 ENCOUNTER — Telehealth: Payer: Self-pay

## 2024-10-27 ENCOUNTER — Encounter: Payer: Self-pay | Admitting: Family Medicine

## 2024-10-27 NOTE — Telephone Encounter (Signed)
 Copied from CRM [id number]. Topic: Clinical - Medication Question >> Oct 27, 2024  9:37 AM Whitney Morales wrote: Reason for CRM: Patient questioning if PCP could increase the dose of her Mounjaro . Patient is not seeing any difference with the dosage that she is currently on.

## 2024-10-28 ENCOUNTER — Other Ambulatory Visit: Payer: Self-pay | Admitting: Family Medicine

## 2024-10-28 MED ORDER — TIRZEPATIDE 7.5 MG/0.5ML ~~LOC~~ SOAJ: 7.5000 mg | SUBCUTANEOUS | 3 refills | Status: AC

## 2024-11-01 ENCOUNTER — Encounter: Payer: Self-pay | Admitting: Family Medicine

## 2024-11-03 ENCOUNTER — Other Ambulatory Visit (HOSPITAL_COMMUNITY): Payer: Self-pay

## 2024-11-03 ENCOUNTER — Telehealth: Payer: Self-pay

## 2024-11-03 NOTE — Telephone Encounter (Signed)
 Pharmacy Patient Advocate Encounter  Received notification from [employer] that Prior Authorization for Mounjaro  7.5mg /0.37ml has been APPROVED from 10/23/24 to 10/22/25   PA #/Case ID/Reference #: [id number]

## 2024-11-03 NOTE — Telephone Encounter (Signed)
 Pharmacy Patient Advocate Encounter   Received notification from CoverMyMeds that prior authorization for Mounjaro  7.5mg /0.63ml is required/requested.   Insurance verification completed.   The patient is insured through [place].   Per test claim: PA required; PA submitted to above mentioned insurance via Latent Key/confirmation #/EOC [facility] Status is pending

## 2024-11-06 ENCOUNTER — Other Ambulatory Visit (HOSPITAL_COMMUNITY): Payer: Self-pay

## 2024-11-13 ENCOUNTER — Ambulatory Visit (INDEPENDENT_AMBULATORY_CARE_PROVIDER_SITE_OTHER)

## 2024-11-13 VITALS — Ht 63.0 in | Wt 211.0 lb

## 2024-11-13 DIAGNOSIS — Z0000 Encounter for general adult medical examination without abnormal findings: Secondary | ICD-10-CM

## 2024-11-13 NOTE — Patient Instructions (Signed)
 Whitney Morales,  Thank you for taking the time for your Medicare Wellness Visit. I appreciate your continued commitment to your health goals. Please review the care plan we discussed, and feel free to reach out if I can assist you further.  Please note that Annual Wellness Visits do not include a physical exam. Some assessments may be limited, especially if the visit was conducted virtually. If needed, we may recommend an in-person follow-up with your provider.  Ongoing Care Seeing your primary care provider every 3 to 6 months helps us  monitor your health and provide consistent, personalized care.   Referrals If a referral was made during today's visit and you haven't received any updates within two weeks, please contact the referred provider directly to check on the status.  Recommended Screenings:  Health Maintenance  Topic Date Due   Zoster (Shingles) Vaccine (1 of 2) 05/20/1993   COVID-19 Vaccine (5 - 2025-26 season) 06/23/2024   Breast Cancer Screening  01/01/2025   Eye exam for diabetics  12/16/2024*   Complete foot exam   12/06/2024   Kidney health urinalysis for diabetes  12/20/2024   Hemoglobin A1C  12/20/2024   Yearly kidney function blood test for diabetes  08/26/2025   Medicare Annual Wellness Visit  11/13/2025   Pneumococcal Vaccine for age over 44  Completed   Flu Shot  Completed   Osteoporosis screening with Bone Density Scan  Completed   Meningitis B Vaccine  Aged Out   DTaP/Tdap/Td vaccine  Discontinued   Colon Cancer Screening  Discontinued   Hepatitis C Screening  Discontinued  *Topic was postponed. The date shown is not the original due date.       11/13/2024   10:30 AM  Advanced Directives  Does Patient Have a Medical Advance Directive? No  Would patient like information on creating a medical advance directive? Yes (MAU/Ambulatory/Procedural Areas - Information given)   Information on Advanced Care Planning can be found at [place]  Secretary of [facility]  Advance Health Care Directives Advance Health Care Directives ([email or web address]   Vision: Annual vision screenings are recommended for early detection of glaucoma, cataracts, and diabetic retinopathy. These exams can also reveal signs of chronic conditions such as diabetes and high blood pressure.  Dental: Annual dental screenings help detect early signs of oral cancer, gum disease, and other conditions linked to overall health, including heart disease and diabetes.  Please see the attached documents for additional preventive care recommendations.

## 2024-11-13 NOTE — Progress Notes (Signed)
 "  Chief Complaint  Patient presents with   Medicare Wellness     Subjective:   Whitney Morales is a 84 y.o. female who presents for a Medicare Annual Wellness Visit.  Visit info / Clinical Intake: Medicare Wellness Visit Type:: Subsequent Annual Wellness Visit Persons participating in visit and providing information:: patient Medicare Wellness Visit Mode:: Telephone If telephone:: video declined Since this visit was completed virtually, some vitals may be partially provided or unavailable. Missing vitals are due to the limitations of the virtual format.: Documented vitals are patient reported If Telephone or Video please confirm:: I connected with patient using audio/video enable telemedicine. I verified patient identity with two identifiers, discussed telehealth limitations, and patient agreed to proceed. Patient Location:: home Provider Location:: office Interpreter Needed?: No Pre-visit prep was completed: yes AWV questionnaire completed by patient prior to visit?: no Living arrangements:: lives with spouse/significant other Patient's Overall Health Status Rating: good Typical amount of pain: some Does pain affect daily life?: no Are you currently prescribed opioids?: no  Dietary Habits and Nutritional Risks How many meals a day?: 3 Eats fruit and vegetables daily?: yes Most meals are obtained by: preparing own meals In the last 2 weeks, have you had any of the following?: none Diabetic:: (!) yes Any non-healing wounds?: no How often do you check your BS?: as needed Would you like to be referred to a Nutritionist or for Diabetic Management? : no  Functional Status Activities of Daily Living (to include ambulation/medication): Independent Ambulation: Independent Medication Administration: Independent Home Management (perform basic housework or laundry): Independent Manage your own finances?: yes Primary transportation is: driving Concerns about vision?: no *vision  screening is required for WTM* Concerns about hearing?: no  Fall Screening Falls in the past year?: 0 Number of falls in past year: 0 Was there an injury with Fall?: 0 Fall Risk Category Calculator: 0 Patient Fall Risk Level: Low Fall Risk  Fall Risk Patient at Risk for Falls Due to: No Fall Risks Fall risk Follow up: Falls prevention discussed; Education provided; Falls evaluation completed  Home and Transportation Safety: All rugs have non-skid backing?: yes All stairs or steps have railings?: yes Grab bars in the bathtub or shower?: yes Have non-skid surface in bathtub or shower?: yes Good home lighting?: yes Regular seat belt use?: yes Hospital stays in the last year:: no  Cognitive Assessment Difficulty concentrating, remembering, or making decisions? : no Will 6CIT or Mini Cog be Completed: yes What year is it?: 0 points What month is it?: 0 points Give patient an address phrase to remember (5 components): [id number] [place] About what time is it?: 0 points Count backwards from 20 to 1: 0 points Say the months of the year in reverse: 0 points Repeat the address phrase from earlier: 0 points 6 CIT Score: 0 points  Advance Directives (For Healthcare) Does Patient Have a Medical Advance Directive?: No Would patient like information on creating a medical advance directive?: Yes (MAU/Ambulatory/Procedural Areas - Information given)  Reviewed/Updated  Reviewed/Updated: Reviewed All (Medical, Surgical, Family, Medications, Allergies, Care Teams, Patient Goals)    Allergies (verified) Clindamycin/lincomycin, Levaquin [levofloxacin], and Metronidazole hcl   Current Medications (verified) Outpatient Encounter Medications as of 11/13/2024  Medication Sig   Accu-Chek Softclix Lancets lancets USE AS DIRECTED TO CHECK SUGAR   allopurinol  (ZYLOPRIM ) 100 MG tablet Take 2 tablets (200 mg total) by mouth daily.   amLODipine  (NORVASC ) 10 MG tablet Take 1 tablet (10 mg  total) by  mouth daily.   Blood Glucose Monitoring Suppl (ACCU-CHEK AVIVA PLUS) w/Device KIT Use to check sugars as needed   cetirizine  (ZYRTEC ) 10 MG tablet Take 1 tablet (10 mg total) by mouth daily.   fluticasone  (FLONASE ) 50 MCG/ACT nasal spray Place 2 sprays into both nostrils daily.   furosemide  (LASIX ) 20 MG tablet TAKE 1 TABLET BY MOUTH EVERY DAY   furosemide  (LASIX ) 40 MG tablet Take 1 tablet (40 mg total) by mouth daily.   glucose blood (ACCU-CHEK AVIVA PLUS) test strip CHECK BLOOD SUGAR TWICE DAILY FASTING IN MORNING AND THEN 2 HOURS AFTER A MEAL E11.65O   levothyroxine  (SYNTHROID ) 88 MCG tablet Take 1 tablet by mouth once daily   lisinopril  (ZESTRIL ) 20 MG tablet TAKE 1 TABLET BY MOUTH TWICE A DAY   Multiple Vitamin (MULTIVITAMIN WITH MINERALS) TABS Take 1 tablet by mouth daily.   Nebivolol  HCl 20 MG TABS TAKE 1 TABLET BY MOUTH EVERY DAY   rosuvastatin  (CRESTOR ) 10 MG tablet TAKE 1 TABLET BY MOUTH EVERY DAY   tirzepatide  (MOUNJARO ) 7.5 MG/0.5ML Pen Inject 7.5 mg into the skin once a week.   vitamin E 180 MG (400 UNITS) capsule Take 400 Units by mouth daily.   No facility-administered encounter medications on file as of 11/13/2024.    History: Past Medical History:  Diagnosis Date   Abnormal finding on EKG 10/29/2012   FOLLOW UP NUCLEAR STRESS TEST ON 11/05/12 -NORMAL   Corneal erosion 2012   RIGHT EYE--HAS RESOLVED--NO PROBLEMS SINCE   Diabetes mellitus    Diverticulitis    Fatigue    GERD (gastroesophageal reflux disease)    PAST HX--NO PROBLEMS NOW-PT DOES TAKE DAILY TUMS AT BEDTIME   Goiter    cyst vs goiter on thyroid    H/O hiatal hernia    High cholesterol    Hypertension    Obesity    Osteopenia    Past Surgical History:  Procedure Laterality Date   ABDOMINAL HYSTERECTOMY     BREAST LUMPECTOMY     BENIGN   CARDIOVASCULAR STRESS TEST  06/08/2009   EF 78%, NO ISCHEMIA   THYROID  LOBECTOMY  11/27/2012   Procedure: THYROID  LOBECTOMY;  Surgeon: Lynda Leos, MD;   Location: [facility];  Service: General;  Laterality: Right;  Right Thyroid  Lobectomy   TONSILLECTOMY     US  ECHOCARDIOGRAPHY  03/23/2006   EF 55-60%   No family history on file. Social History   Occupational History   Not on file  Tobacco Use   Smoking status: Never   Smokeless tobacco: Never  Substance and Sexual Activity   Alcohol use: No   Drug use: No   Sexual activity: Not Currently   Tobacco Counseling Counseling given: Not Answered  SDOH Screenings   Food Insecurity: No Food Insecurity (11/13/2024)  Housing: Low Risk (11/13/2024)  Transportation Needs: No Transportation Needs (11/13/2024)  Utilities: Not At Risk (11/13/2024)  Alcohol Screen: Low Risk (01/18/2023)  Depression (PHQ2-9): Low Risk (11/13/2024)  Financial Resource Strain: Low Risk (01/18/2023)  Physical Activity: Sufficiently Active (11/13/2024)  Social Connections: Moderately Integrated (11/13/2024)  Stress: No Stress Concern Present (11/13/2024)  Tobacco Use: Low Risk (11/13/2024)  Health Literacy: Adequate Health Literacy (11/13/2024)   See flowsheets for full screening details  Depression Screen PHQ 2 & 9 Depression Scale- Over the past 2 weeks, how often have you been bothered by any of the following problems? Little interest or pleasure in doing things: 0 Feeling down, depressed, or hopeless (PHQ Adolescent also includes...irritable): 0 PHQ-2 Total  Score: 0     Goals Addressed             This Visit's Progress    Maintain health and independence   On track            Objective:    Today's Vitals   11/13/24 1029  Weight: 211 lb (95.7 kg)  Height: 5' 3 (1.6 m)   Body mass index is 37.38 kg/m.  Hearing/Vision screen Vision Screening - Comments:: Wears rx glasses - up to date with routine eye exams with Dr. Charmayne  Immunizations and Health Maintenance Health Maintenance  Topic Date Due   Zoster Vaccines- Shingrix (1 of 2) 05/20/1993   COVID-19 Vaccine (5 - 2025-26 season) 06/23/2024    Mammogram  01/01/2025   OPHTHALMOLOGY EXAM  12/16/2024 (Originally 08/19/2024)   FOOT EXAM  12/06/2024   Diabetic kidney evaluation - Urine ACR  12/20/2024   HEMOGLOBIN A1C  12/20/2024   Diabetic kidney evaluation - eGFR measurement  08/26/2025   Medicare Annual Wellness (AWV)  11/13/2025   Pneumococcal Vaccine: 50+ Years  Completed   Influenza Vaccine  Completed   Bone Density Scan  Completed   Meningococcal B Vaccine  Aged Out   DTaP/Tdap/Td  Discontinued   Colonoscopy  Discontinued   Hepatitis C Screening  Discontinued        Assessment/Plan:  This is a routine wellness examination for Whitney Morales.  Patient Care Team: Duanne Butler DASEN, MD as PCP - General (Family Medicine) Charmayne Molly, MD as Consulting Physician (Ophthalmology) Mammography, [facility] (Diagnostic Radiology)  I have personally reviewed and noted the following in the patients chart:   Medical and social history Use of alcohol, tobacco or illicit drugs  Current medications and supplements including opioid prescriptions. Functional ability and status Nutritional status Physical activity Advanced directives List of other physicians Hospitalizations, surgeries, and ER visits in previous 12 months Vitals Screenings to include cognitive, depression, and falls Referrals and appointments  No orders of the defined types were placed in this encounter.  In addition, I have reviewed and discussed with patient certain preventive protocols, quality metrics, and best practice recommendations. A written personalized care plan for preventive services as well as general preventive health recommendations were provided to patient.   Lavelle Charmaine Browner, [place]   [id number]/77/7973   Return in 1 year (on 11/13/2025).  After Visit Summary: (Mail) Due to this being a telephonic visit, the after visit summary with patients personalized plan was offered to patient via mail   Nurse Notes: Appointment(s) made: (labs and return office  visit) HM Addressed: Diabetic eye exam notes requested   "

## 2024-11-19 ENCOUNTER — Telehealth: Payer: Self-pay

## 2024-11-19 NOTE — Telephone Encounter (Signed)
 Prescription Request  11/19/2024  LOV: 08/26/24  What is the name of the medication or equipment? allopurinol  (ZYLOPRIM ) 100 MG tablet [[id number]]   Have you contacted your pharmacy to request a refill? Yes   Which pharmacy would you like this sent to?  [id number] - [place], [place] - [address] AT [facility] [address] [place] [address] Phone: [phone] Fax: [phone]    Patient notified that their request is being sent to the clinical staff for review and that they should receive a response within 2 business days.   Please advise at [facility] [phone]

## 2024-11-20 ENCOUNTER — Other Ambulatory Visit: Payer: Self-pay

## 2024-11-20 DIAGNOSIS — N1831 Chronic kidney disease, stage 3a: Secondary | ICD-10-CM

## 2024-11-20 MED ORDER — ALLOPURINOL 100 MG PO TABS: 200.0000 mg | ORAL_TABLET | Freq: Every day | ORAL | 0 refills | Status: AC

## 2024-11-20 NOTE — Telephone Encounter (Signed)
 Sent in medication

## 2024-11-25 ENCOUNTER — Other Ambulatory Visit: Payer: Self-pay | Admitting: Family Medicine

## 2024-11-25 DIAGNOSIS — E038 Other specified hypothyroidism: Secondary | ICD-10-CM

## 2024-12-23 ENCOUNTER — Other Ambulatory Visit

## 2024-12-25 ENCOUNTER — Ambulatory Visit: Admitting: Family Medicine
